# Patient Record
Sex: Female | Born: 1985 | Hispanic: No | Marital: Single | Smoking: Former smoker
Health system: Southern US, Community
[De-identification: ages and names within clinical notes are randomized; demographics above are authoritative.]

## PROBLEM LIST (undated history)

## (undated) ENCOUNTER — Inpatient Hospital Stay (HOSPITAL_COMMUNITY): Payer: Self-pay

## (undated) DIAGNOSIS — I10 Essential (primary) hypertension: Secondary | ICD-10-CM

## (undated) DIAGNOSIS — J45909 Unspecified asthma, uncomplicated: Secondary | ICD-10-CM

## (undated) DIAGNOSIS — Z789 Other specified health status: Secondary | ICD-10-CM

## (undated) DIAGNOSIS — G43909 Migraine, unspecified, not intractable, without status migrainosus: Secondary | ICD-10-CM

## (undated) HISTORY — PX: NO PAST SURGERIES: SHX2092

---
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
## 2007-07-25 ENCOUNTER — Emergency Department: Payer: Self-pay | Admitting: Emergency Medicine

## 2007-12-05 ENCOUNTER — Ambulatory Visit: Payer: Self-pay | Admitting: Obstetrics & Gynecology

## 2007-12-05 ENCOUNTER — Inpatient Hospital Stay (HOSPITAL_COMMUNITY): Admission: AD | Admit: 2007-12-05 | Discharge: 2007-12-07 | Payer: Self-pay | Admitting: Obstetrics & Gynecology

## 2010-11-15 LAB — CBC
MCHC: 34.3
MCV: 92.3
Platelets: 215
WBC: 13.7 — ABNORMAL HIGH

## 2010-11-15 LAB — RPR: RPR Ser Ql: NONREACTIVE

## 2010-11-15 LAB — RAPID HIV SCREEN (WH-MAU): Rapid HIV Screen: NONREACTIVE

## 2012-08-14 ENCOUNTER — Emergency Department (HOSPITAL_COMMUNITY)
Admission: EM | Admit: 2012-08-14 | Discharge: 2012-08-15 | Discharge status: Against Medical Advice | Payer: Self-pay | Attending: Emergency Medicine | Admitting: Emergency Medicine

## 2012-08-14 ENCOUNTER — Encounter (HOSPITAL_COMMUNITY): Payer: Self-pay | Admitting: Emergency Medicine

## 2012-08-14 DIAGNOSIS — Z79899 Other long term (current) drug therapy: Secondary | ICD-10-CM | POA: Insufficient documentation

## 2012-08-14 DIAGNOSIS — O219 Vomiting of pregnancy, unspecified: Secondary | ICD-10-CM | POA: Insufficient documentation

## 2012-08-14 DIAGNOSIS — Z3490 Encounter for supervision of normal pregnancy, unspecified, unspecified trimester: Secondary | ICD-10-CM

## 2012-08-14 DIAGNOSIS — R51 Headache: Secondary | ICD-10-CM | POA: Insufficient documentation

## 2012-08-14 DIAGNOSIS — O99330 Smoking (tobacco) complicating pregnancy, unspecified trimester: Secondary | ICD-10-CM | POA: Insufficient documentation

## 2012-08-14 DIAGNOSIS — F17200 Nicotine dependence, unspecified, uncomplicated: Secondary | ICD-10-CM | POA: Insufficient documentation

## 2012-08-14 DIAGNOSIS — R519 Headache, unspecified: Secondary | ICD-10-CM

## 2012-08-14 DIAGNOSIS — R112 Nausea with vomiting, unspecified: Secondary | ICD-10-CM | POA: Insufficient documentation

## 2012-08-14 DIAGNOSIS — Z3201 Encounter for pregnancy test, result positive: Secondary | ICD-10-CM | POA: Insufficient documentation

## 2012-08-14 LAB — URINALYSIS, ROUTINE W REFLEX MICROSCOPIC
Bilirubin Urine: NEGATIVE
Glucose, UA: NEGATIVE mg/dL
Nitrite: NEGATIVE
Specific Gravity, Urine: 1.02 (ref 1.005–1.030)
pH: 6.5 (ref 5.0–8.0)

## 2012-08-14 LAB — CBC WITH DIFFERENTIAL/PLATELET
Basophils Absolute: 0.1 10*3/uL (ref 0.0–0.1)
Basophils Relative: 1 % (ref 0–1)
Eosinophils Absolute: 0.3 10*3/uL (ref 0.0–0.7)
MCH: 30.1 pg (ref 26.0–34.0)
MCHC: 34.7 g/dL (ref 30.0–36.0)
Neutro Abs: 5.9 10*3/uL (ref 1.7–7.7)
Neutrophils Relative %: 68 % (ref 43–77)
Platelets: 239 10*3/uL (ref 150–400)

## 2012-08-14 LAB — COMPREHENSIVE METABOLIC PANEL
ALT: 7 U/L (ref 0–35)
AST: 12 U/L (ref 0–37)
Albumin: 3.6 g/dL (ref 3.5–5.2)
Alkaline Phosphatase: 66 U/L (ref 39–117)
Potassium: 3.4 mEq/L — ABNORMAL LOW (ref 3.5–5.1)
Sodium: 137 mEq/L (ref 135–145)
Total Protein: 6.8 g/dL (ref 6.0–8.3)

## 2012-08-14 LAB — LIPASE, BLOOD: Lipase: 25 U/L (ref 11–59)

## 2012-08-14 LAB — POCT PREGNANCY, URINE: Preg Test, Ur: POSITIVE — AB

## 2012-08-14 LAB — URINE MICROSCOPIC-ADD ON

## 2012-08-14 NOTE — ED Notes (Signed)
PT. REPORTS INTERMITTENT  LOWER ABDOMINAL PAIN WITH NAUSEA FOR 2 WEEKS - SHE THINKS SHE IS PREGNANT , DENIES DIARRHEA /FEVER OR CHILLS. LMP= 06/30/2012. SLIGHT VAGINAL DISCHARGE.

## 2012-08-15 ENCOUNTER — Emergency Department (HOSPITAL_COMMUNITY): Payer: Self-pay

## 2012-08-15 MED ORDER — ACETAMINOPHEN 325 MG PO TABS
975.0000 mg | ORAL_TABLET | Freq: Once | ORAL | Status: AC
  Administered 2012-08-15: 975 mg via ORAL
  Filled 2012-08-15: qty 3

## 2012-08-15 NOTE — ED Provider Notes (Signed)
History    CSN: [id number] Arrival date & time 08/14/12  2155  First MD Initiated Contact with Patient 08/14/12 2334     Chief Complaint  Patient presents with  . Abdominal Pain   (Consider location/radiation/quality/duration/timing/severity/associated sxs/prior Treatment) HPI Patient presents to the emergency department with lower abdominal pain for the last few weeks.  Patient, states she thinks she may be pregnant.  Patient denies headache, blurred vision, weakness, dizziness, syncope, back pain, diarrhea, chest pain, shortness breath, fever, rash, cough, or dysuria.  Patient, states she's had some mild nausea, one episode of vomiting.  Patient, states she did not take any medications prior to arrival.  Patient also, states, that nothing seems to make her condition, better or worse.   History reviewed. No pertinent past medical history. History reviewed. No pertinent past surgical history. No family history on file. History  Substance Use Topics  . Smoking status: Current Every Day Smoker  . Smokeless tobacco: Not on file  . Alcohol Use: No   OB History   Grav Para Term Preterm Abortions TAB SAB Ect Mult Living                 Review of Systems All other systems negative except as documented in the HPI. All pertinent positives and negatives as reviewed in the HPI. Allergies  Review of patient's allergies indicates no known allergies.  Home Medications   Current Outpatient Rx  Name  Route  Sig  Dispense  Refill  . albuterol (PROVENTIL HFA;VENTOLIN HFA) 108 (90 BASE) MCG/ACT inhaler   Inhalation   Inhale 2 puffs into the lungs every 6 (six) hours as needed for wheezing.          BP 131/78  Pulse 95  Temp(Src) 99.1 F (37.3 C) (Oral)  Resp 14  SpO2 98%  LMP 06/30/2012 Physical Exam  Nursing note and vitals reviewed. Constitutional: She is oriented to person, place, and time. She appears well-developed and well-nourished. No distress.  HENT:  Head:  Normocephalic and atraumatic.  Mouth/Throat: Oropharynx is clear and moist.  Eyes: Pupils are equal, round, and reactive to light.  Neck: Normal range of motion. Neck supple.  Cardiovascular: Normal rate, regular rhythm and normal heart sounds.  Exam reveals no gallop and no friction rub.   No murmur heard. Pulmonary/Chest: Effort normal and breath sounds normal. No respiratory distress.  Abdominal: Soft. Bowel sounds are normal.  Neurological: She is alert and oriented to person, place, and time. She exhibits normal muscle tone. Coordination normal.  Skin: Skin is warm and dry.    ED Course  Procedures (including critical care time) Labs Reviewed  CBC WITH DIFFERENTIAL - Abnormal; Notable for the following:    HCT 35.2 (*)    All other components within normal limits  COMPREHENSIVE METABOLIC PANEL - Abnormal; Notable for the following:    Potassium 3.4 (*)    Calcium 8.2 (*)    Total Bilirubin 0.1 (*)    All other components within normal limits  URINALYSIS, ROUTINE W REFLEX MICROSCOPIC - Abnormal; Notable for the following:    APPearance CLOUDY (*)    Leukocytes, UA SMALL (*)    All other components within normal limits  HCG, QUANTITATIVE, PREGNANCY - Abnormal; Notable for the following:    hCG, Beta Chain, Quant, S 5680 (*)    All other components within normal limits  URINE MICROSCOPIC-ADD ON - Abnormal; Notable for the following:    Squamous Epithelial / LPF MANY (*)  Bacteria, UA FEW (*)    All other components within normal limits  POCT PREGNANCY, URINE - Abnormal; Notable for the following:    Preg Test, Ur POSITIVE (*)    All other components within normal limits  URINE CULTURE  LIPASE, BLOOD   patient is awaiting ultrasound to ensure that there is no abnormality with her pregnancy.  Patient advised followup with GYN.  Patient's been stable here in the emergency department patient,    1:32 AM The patient apparently left prior to her ultrasound being  performed. MDM  MDM Reviewed: nursing note and vitals Interpretation: labs and ultrasound      Carlyle Dolly, PA-C 08/15/12 0115  Carlyle Dolly, PA-C 08/15/12 [phone]

## 2012-08-15 NOTE — ED Provider Notes (Signed)
Medical screening examination/treatment/procedure(s) were performed by non-physician practitioner and as supervising physician I was immediately available for consultation/collaboration.  Dareen Gutzwiller M Jo-Anne Kluth, MD 08/15/12 0413 

## 2012-08-15 NOTE — ED Notes (Signed)
Tech went in room to transport patient to ultrasound, pt not in room and gown on bed. Pt left AMA, P.A made aware.

## 2012-08-16 LAB — URINE CULTURE: Colony Count: 10000

## 2012-08-20 ENCOUNTER — Inpatient Hospital Stay (HOSPITAL_COMMUNITY)
Admission: AD | Admit: 2012-08-20 | Discharge: 2012-08-21 | Disposition: A | Discharge status: Home / Self Care | Payer: Self-pay | Source: Ambulatory Visit | Attending: Obstetrics & Gynecology | Admitting: Obstetrics & Gynecology

## 2012-08-20 ENCOUNTER — Encounter (HOSPITAL_COMMUNITY): Payer: Self-pay | Admitting: *Deleted

## 2012-08-20 DIAGNOSIS — R51 Headache: Secondary | ICD-10-CM | POA: Insufficient documentation

## 2012-08-20 DIAGNOSIS — O99891 Other specified diseases and conditions complicating pregnancy: Secondary | ICD-10-CM | POA: Insufficient documentation

## 2012-08-20 DIAGNOSIS — N949 Unspecified condition associated with female genital organs and menstrual cycle: Secondary | ICD-10-CM | POA: Insufficient documentation

## 2012-08-20 DIAGNOSIS — R109 Unspecified abdominal pain: Secondary | ICD-10-CM | POA: Insufficient documentation

## 2012-08-20 DIAGNOSIS — O26899 Other specified pregnancy related conditions, unspecified trimester: Secondary | ICD-10-CM

## 2012-08-20 DIAGNOSIS — O418X10 Other specified disorders of amniotic fluid and membranes, first trimester, not applicable or unspecified: Secondary | ICD-10-CM

## 2012-08-20 DIAGNOSIS — O468X1 Other antepartum hemorrhage, first trimester: Secondary | ICD-10-CM

## 2012-08-20 HISTORY — DX: Other specified health status: Z78.9

## 2012-08-20 LAB — CBC
HCT: 35.6 % — ABNORMAL LOW (ref 36.0–46.0)
Hemoglobin: 12.5 g/dL (ref 12.0–15.0)
MCH: 30.1 pg (ref 26.0–34.0)
MCHC: 35.1 g/dL (ref 30.0–36.0)
MCV: 85.8 fL (ref 78.0–100.0)

## 2012-08-20 LAB — HCG, QUANTITATIVE, PREGNANCY: hCG, Beta Chain, Quant, S: 19954 m[IU]/mL — ABNORMAL HIGH (ref <5)

## 2012-08-20 MED ORDER — OXYCODONE-ACETAMINOPHEN 5-325 MG PO TABS: 2.0000 | ORAL_TABLET | Freq: Once | ORAL | Status: DC

## 2012-08-20 NOTE — MAU Note (Signed)
Pt states she has been having abdominal pain for almost 2 wks

## 2012-08-20 NOTE — MAU Note (Signed)
PT SAYS SHE HAS HAD ABD PAIN IN LOWER ABD - THAT STARTED  ON 6-25-- NEVER HAD  THIS PAIN BEFORE.    LMP - 5-22.   WENT TO  [facility] ON 7-2-    ONLY TRIAGED- THEN LEFT.    PT HAS  NOT  DONE HPT - THINKS IN DENIAL-   LAST SEX- 6-25.  NO BIRTH CONTROL.  SHE WAS ON NUVARING-   CAUSED IRRITATION- SO SHE STOPPED.  SHE WAS IN MILITARY.    HAS  H/A  - ALL TIME-  STARTED AT LUNCH TIME-  TOOK NO MED.   ALWAYS NAUSEA- NO VOMITING.

## 2012-08-20 NOTE — MAU Provider Note (Signed)
Chief Complaint: No chief complaint on file.  First Provider Initiated Contact with Patient 08/20/12 2334    SUBJECTIVE HPI: Michele Gardner is a 27 y.o. G2P0 at [redacted]w[redacted]d by LMP who presents with low abd pain and nausea x 2 weeks and secondary amenorrhea. Pos UPT at [facility] ED 08/14/12. Quant 5680. No [place]. Left before being seen.   No past medical history on file. OB History   Grav Para Term Preterm Abortions TAB SAB Ect Mult Living   2         1     # Outc Date GA Lbr Len/2nd Wgt Sex Del Anes PTL Lv   1 CUR            2 GRA              No past surgical history on file. History   Social History  . Marital Status: Single    Spouse Name: N/A    Number of Children: N/A  . Years of Education: N/A   Occupational History  . Not on file.   Social History Main Topics  . Smoking status: Current Every Day Smoker  . Smokeless tobacco: Not on file  . Alcohol Use: No  . Drug Use: No  . Sexually Active: Yes   Other Topics Concern  . Not on file   Social History Narrative  . No narrative on file   No current facility-administered medications on file prior to encounter.   Current Outpatient Prescriptions on File Prior to Encounter  Medication Sig Dispense Refill  . albuterol (PROVENTIL HFA;VENTOLIN HFA) 108 (90 BASE) MCG/ACT inhaler Inhale 2 puffs into the lungs every 6 (six) hours as needed for wheezing.       No Known Allergies  ROS: Positive items in HPI. Neg for fever, chills, vomiting, diarrhea, constipation, vaginal bleeding, vaginal discharge, urinary complaints or dyspareunia.   OBJECTIVE Blood pressure 120/77, pulse 80, temperature 98.5 F (36.9 C), temperature source Oral, resp. rate 20, height 5\' 5"  (1.651 m), weight 70.024 kg (154 lb 6 oz), last menstrual period 07/04/2012. GENERAL: Well-developed, well-nourished female in no acute distress.  HEENT: Normocephalic HEART: normal rate RESP: normal effort ABDOMEN: Soft, Mild bilat low abd tenderness. EXTREMITIES: Nontender, no  edema NEURO: Alert and oriented SPECULUM EXAM: NEFG, moderate amount of creamy, white, odorless discharge, no blood noted, cervix clean BIMANUAL: cervix closed; uterus slightly enlarged. no adnexal tenderness or masses  LAB RESULTS Results for orders placed during the hospital encounter of 08/20/12 (from the past 24 hour(s))  ABO/RH     Status: None   Collection Time    08/20/12 10:45 PM      Result Value Range   ABO/RH(D) O POS    HCG, QUANTITATIVE, PREGNANCY     Status: Abnormal   Collection Time    08/20/12 10:45 PM      Result Value Range   hCG, Beta Chain, Quant, S 13086 (*) <5 mIU/mL  WET PREP, GENITAL     Status: Abnormal   Collection Time    08/20/12 10:45 PM      Result Value Range   Yeast Wet Prep HPF POC NONE SEEN  NONE SEEN   Trich, Wet Prep NONE SEEN  NONE SEEN   Clue Cells Wet Prep HPF POC FEW (*) NONE SEEN   WBC, Wet Prep HPF POC FEW (*) NONE SEEN  CBC     Status: Abnormal   Collection Time    08/20/12 10:45 PM  Result Value Range   WBC 10.2  4.0 - 10.5 K/uL   RBC 4.15  3.87 - 5.11 MIL/uL   Hemoglobin 12.5  12.0 - 15.0 g/dL   HCT 16.1 (*) 09.6 - 04.5 %   MCV 85.8  78.0 - 100.0 fL   MCH 30.1  26.0 - 34.0 pg   MCHC 35.1  30.0 - 36.0 g/dL   RDW 40.9  81.1 - 91.4 %   Platelets 250  150 - 400 K/uL    IMAGING US Ob Comp Less 14 Wks  08/21/2012   *RADIOLOGY REPORT*  Clinical Data: Pelvic pain for 2 weeks.  Estimated gestational age by LMP is 6 weeks 6 days.  Quantitative beta HCG is 19,954.  OBSTETRIC <14 WK [place] AND TRANSVAGINAL OB US  Technique:  Both transabdominal and transvaginal ultrasound examinations were performed for complete evaluation of the gestation as well as the maternal uterus, adnexal regions, and pelvic cul-de-sac.  Transvaginal technique was performed to assess early pregnancy.  Comparison:  None.  Intrauterine gestational sac:  A single intrauterine gestational sac is visualized. The gestational sac appears to be located laterally in the uterine  fundus towards the right. Yolk sac: Yolk sac is visualized. Embryo: Fetal pole is visualized. Cardiac Activity: Fetal cardiac activity is observed. Heart Rate: 116 bpm  CRL: 6.7  mm  6 w  4 d        [place] EDC: 04/12/2012  Maternal uterus/adnexae: Uterine myometrium appears homogeneous.  There is a moderate sized subchorionic hemorrhage noted.  Small amount of fluid visualized in the lower uterine segment.  Both ovaries are visualized.  No abnormal adnexal masses are seen.  No free pelvic fluid collections.  IMPRESSION: Single intrauterine pregnancy demonstrated.  Moderate sized subchorionic hemorrhage.  Estimated gestational age by crown-rump length is 6 weeks 4 days.   Original Report Authenticated By: Burman Nieves, M.D.   US Ob Transvaginal  08/21/2012   *RADIOLOGY REPORT*  Clinical Data: Pelvic pain for 2 weeks.  Estimated gestational age by LMP is 6 weeks 6 days.  Quantitative beta HCG is 19,954.  OBSTETRIC <14 WK [place] AND TRANSVAGINAL OB US  Technique:  Both transabdominal and transvaginal ultrasound examinations were performed for complete evaluation of the gestation as well as the maternal uterus, adnexal regions, and pelvic cul-de-sac.  Transvaginal technique was performed to assess early pregnancy.  Comparison:  None.  Intrauterine gestational sac:  A single intrauterine gestational sac is visualized. The gestational sac appears to be located laterally in the uterine fundus towards the right. Yolk sac: Yolk sac is visualized. Embryo: Fetal pole is visualized. Cardiac Activity: Fetal cardiac activity is observed. Heart Rate: 116 bpm  CRL: 6.7  mm  6 w  4 d        [place] EDC: 04/12/2012  Maternal uterus/adnexae: Uterine myometrium appears homogeneous.  There is a moderate sized subchorionic hemorrhage noted.  Small amount of fluid visualized in the lower uterine segment.  Both ovaries are visualized.  No abnormal adnexal masses are seen.  No free pelvic fluid collections.  IMPRESSION: Single intrauterine  pregnancy demonstrated.  Moderate sized subchorionic hemorrhage.  Estimated gestational age by crown-rump length is 6 weeks 4 days.   Original Report Authenticated By: Burman Nieves, M.D.    MAU COURSE  ASSESSMENT 1. Abdominal pain in pregnancy, antepartum   2. Subchorionic hemorrhage in first trimester    PLAN Discharge home in stable condition. Tylenol PRN. Pregnancy verification given. Comfort measures.  Follow-up Information   Follow  up with [facility].      Follow up with [facility] [place] [facility]. (As needed if symptoms worsen)    Contact information:   [address] [id number] [place] [address] [phone]        Medication List         albuterol 108 (90 BASE) MCG/ACT inhaler  Commonly known as:  PROVENTIL HFA;VENTOLIN HFA  Inhale 2 puffs into the lungs every 6 (six) hours as needed for wheezing.       [place], [place] 08/20/2012  11:17 PM

## 2012-08-21 ENCOUNTER — Inpatient Hospital Stay (HOSPITAL_COMMUNITY): Payer: Self-pay

## 2012-08-21 DIAGNOSIS — O9989 Other specified diseases and conditions complicating pregnancy, childbirth and the puerperium: Secondary | ICD-10-CM

## 2012-08-21 DIAGNOSIS — R109 Unspecified abdominal pain: Secondary | ICD-10-CM

## 2012-08-21 LAB — WET PREP, GENITAL

## 2012-08-21 LAB — ABO/RH: ABO/RH(D): O POS

## 2012-12-10 ENCOUNTER — Emergency Department (HOSPITAL_COMMUNITY)
Admission: EM | Admit: 2012-12-10 | Discharge: 2012-12-10 | Discharge status: Against Medical Advice | Payer: Self-pay | Attending: Emergency Medicine | Admitting: Emergency Medicine

## 2012-12-10 ENCOUNTER — Encounter (HOSPITAL_COMMUNITY): Payer: Self-pay | Admitting: Emergency Medicine

## 2012-12-10 DIAGNOSIS — F17200 Nicotine dependence, unspecified, uncomplicated: Secondary | ICD-10-CM | POA: Insufficient documentation

## 2012-12-10 DIAGNOSIS — G43909 Migraine, unspecified, not intractable, without status migrainosus: Secondary | ICD-10-CM | POA: Insufficient documentation

## 2012-12-10 NOTE — ED Notes (Signed)
Pt c/o migraine headache. States she has a hx of migraines. Pt states she has had symptoms since yesterday. Pt c/o head spinning, nausea, and dizziness since yesterday. Pt states she has taken Exedrin for pain. States she last took it 2 days ago. Pt denies vomiting. Denies blurred vision. Pt alert, no acute distress. Pt ambulatory to exam room with steady gait.

## 2012-12-10 NOTE — ED Notes (Signed)
Called for patient 3 times.  Not in lobby.  Will discharge.

## 2013-01-18 ENCOUNTER — Emergency Department (INDEPENDENT_AMBULATORY_CARE_PROVIDER_SITE_OTHER)
Admission: EM | Admit: 2013-01-18 | Discharge: 2013-01-18 | Disposition: A | Discharge status: Home / Self Care | Payer: Self-pay | Source: Home / Self Care | Attending: Emergency Medicine | Admitting: Emergency Medicine

## 2013-01-18 ENCOUNTER — Encounter (HOSPITAL_COMMUNITY): Payer: Self-pay | Admitting: Emergency Medicine

## 2013-01-18 ENCOUNTER — Emergency Department (INDEPENDENT_AMBULATORY_CARE_PROVIDER_SITE_OTHER): Payer: Self-pay

## 2013-01-18 DIAGNOSIS — M549 Dorsalgia, unspecified: Secondary | ICD-10-CM

## 2013-01-18 DIAGNOSIS — J069 Acute upper respiratory infection, unspecified: Secondary | ICD-10-CM

## 2013-01-18 DIAGNOSIS — R0789 Other chest pain: Secondary | ICD-10-CM

## 2013-01-18 MED ORDER — BENZONATATE 200 MG PO CAPS: 200.0000 mg | ORAL_CAPSULE | Freq: Three times a day (TID) | ORAL | Status: DC | PRN

## 2013-01-18 MED ORDER — AZITHROMYCIN 250 MG PO TABS: ORAL_TABLET | ORAL | Status: DC

## 2013-01-18 MED ORDER — METHYLPREDNISOLONE 4 MG PO KIT: PACK | ORAL | Status: DC

## 2013-01-18 NOTE — ED Provider Notes (Signed)
Medical screening examination/treatment/procedure(s) were performed by non-physician practitioner and as supervising physician I was immediately available for consultation/collaboration.  Leslee Home, M.D.  Reuben Likes, MD 01/18/13 2055

## 2013-01-18 NOTE — ED Provider Notes (Signed)
CSN: [id number]     Arrival date & time 01/18/13  1154 History   First MD Initiated Contact with Patient 01/18/13 1326     Chief Complaint  Patient presents with  . URI   (Consider location/radiation/quality/duration/timing/severity/associated sxs/prior Treatment) HPI Comments: 27 year old female with a history of asthma, currently smoking one pack per day, presents complaining of chest tightness that radiates to her back, sore throat, swollen tonsils, fever, chills. This began yesterday. She has been taking over-the-counter medications without relief of her symptoms. Her symptoms have gradually been worsening since she first noticed them yesterday. She denies abdominal pain, NVD, dizziness, lightheadedness, severe chest pain, measured fever, recent travel, sick contacts. She did not receive her flu shot this year   Past Medical History  Diagnosis Date  . Medical history non-contributory    Past Surgical History  Procedure Laterality Date  . No past surgeries     Family History  Problem Relation Age of Onset  . Hypertension Mother   . Cancer Maternal Grandfather   . Cancer Paternal Grandmother   . Diabetes Paternal Grandfather    History  Substance Use Topics  . Smoking status: Current Every Day Smoker  . Smokeless tobacco: Not on file  . Alcohol Use: No   OB History   Grav Para Term Preterm Abortions TAB SAB Ect Mult Living   2         1     Review of Systems  Constitutional: Positive for fever, chills and fatigue.  HENT: Positive for congestion and sore throat.   Eyes: Negative for visual disturbance.  Respiratory: Positive for cough and chest tightness. Negative for shortness of breath and wheezing.   Cardiovascular: Negative for chest pain, palpitations and leg swelling.  Gastrointestinal: Negative for nausea, vomiting and abdominal pain.  Endocrine: Negative for polydipsia and polyuria.  Genitourinary: Negative for dysuria, urgency and frequency.  Musculoskeletal:  Positive for back pain. Negative for arthralgias and myalgias.  Skin: Negative for rash.  Neurological: Negative for dizziness, weakness and light-headedness.    Allergies  Review of patient's allergies indicates no known allergies.  Home Medications   Current Outpatient Rx  Name  Route  Sig  Dispense  Refill  . albuterol (PROVENTIL HFA;VENTOLIN HFA) 108 (90 BASE) MCG/ACT inhaler   Inhalation   Inhale 2 puffs into the lungs every 6 (six) hours as needed for wheezing.         Marland Kitchen aspirin-acetaminophen-caffeine (EXCEDRIN MIGRAINE) 250-250-65 MG per tablet   Oral   Take 1 tablet by mouth every 6 (six) hours as needed for pain (pain).         Marland Kitchen azithromycin (ZITHROMAX Z-PAK) 250 MG tablet      Use as directed   6 each   0   . benzonatate (TESSALON) 200 MG capsule   Oral   Take 1 capsule (200 mg total) by mouth 3 (three) times daily as needed for cough.   20 capsule   0   . methylPREDNISolone (MEDROL DOSEPAK) 4 MG tablet      follow package directions   21 tablet   0     Dispense as written.    BP 140/96  Pulse 99  Temp(Src) 100.3 F (37.9 C) (Oral)  Resp 16  SpO2 99%  LMP 01/13/2013  Breastfeeding? No Physical Exam  Nursing note and vitals reviewed. Constitutional: She is oriented to person, place, and time. Vital signs are normal. She appears well-developed and well-nourished. No distress.  HENT:  Head:  Normocephalic and atraumatic.  Nose: Nose normal.  Mouth/Throat: Oropharynx is clear and moist. No oropharyngeal exudate.  Eyes: Conjunctivae and EOM are normal. Pupils are equal, round, and reactive to light. Right eye exhibits no discharge. Left eye exhibits no discharge.  Neck: Normal range of motion. Neck supple.  Cardiovascular: Normal rate and normal heart sounds.   Pulmonary/Chest: Effort normal and breath sounds normal. No respiratory distress.  Abdominal: Soft. There is no tenderness.  Lymphadenopathy:    She has no cervical adenopathy.   Neurological: She is alert and oriented to person, place, and time. She has normal strength. Coordination normal.  Skin: Skin is warm and dry. No rash noted. She is not diaphoretic.  Psychiatric: She has a normal mood and affect. Judgment normal.    ED Course  Procedures (including critical care time) Labs Review Labs Reviewed  CULTURE, GROUP A STREP  POCT RAPID STREP A ([facility] ONLY)   Imaging Review Dg Chest 2 View  01/18/2013   CLINICAL DATA:  Cough, congestion and fever  EXAM: CHEST  2 VIEW  COMPARISON:  None.  FINDINGS: The heart size and mediastinal contours are within normal limits. Both lungs are clear. The visualized skeletal structures are unremarkable.  IMPRESSION: No active cardiopulmonary disease.   Electronically Signed   By: Signa Kell M.D.   On: 01/18/2013 14:02      MDM   1. URI (upper respiratory infection)   2. Chest tightness   3. Back pain    Other than low-grade fever, vital signs normal. Patient is sitting comfortably, in no distress. Treating symptomatically, followup immediately if any worsening of the chest tightness or shortness of breath  Meds ordered this encounter  Medications  . azithromycin (ZITHROMAX Z-PAK) 250 MG tablet    Sig: Use as directed    Dispense:  6 each    Refill:  0    Order Specific Question:  Supervising Provider    Answer:  Lorenz Coaster, DAVID C [id number]  . methylPREDNISolone (MEDROL DOSEPAK) 4 MG tablet    Sig: follow package directions    Dispense:  21 tablet    Refill:  0    Order Specific Question:  Supervising Provider    Answer:  Lorenz Coaster, DAVID C [id number]  . benzonatate (TESSALON) 200 MG capsule    Sig: Take 1 capsule (200 mg total) by mouth 3 (three) times daily as needed for cough.    Dispense:  20 capsule    Refill:  0    Order Specific Question:  Supervising Provider    Answer:  Lorenz Coaster, DAVID C [[id number]]     Graylon Good, PA-C 01/18/13 1750

## 2013-01-18 NOTE — ED Notes (Signed)
Pt c/o cold sxs onset yest Sxs include: Chest d/c that radiates to the back, ST, dry cough, swollen tonsils, chills, fevers Denies: v/n/d, wheezing, SOB... Hx of asthma Smokes 1 PPD She is alert w/no signs of acute distress.

## 2013-01-20 LAB — CULTURE, GROUP A STREP

## 2013-01-21 ENCOUNTER — Emergency Department: Payer: Self-pay | Admitting: Emergency Medicine

## 2013-01-21 LAB — RAPID INFLUENZA A&B ANTIGENS

## 2013-10-07 ENCOUNTER — Encounter (HOSPITAL_COMMUNITY): Payer: Self-pay | Admitting: *Deleted

## 2013-10-07 ENCOUNTER — Inpatient Hospital Stay (HOSPITAL_COMMUNITY)
Admission: AD | Admit: 2013-10-07 | Discharge: 2013-10-07 | Disposition: A | Discharge status: Home / Self Care | Payer: Self-pay | Source: Ambulatory Visit | Attending: Obstetrics & Gynecology | Admitting: Obstetrics & Gynecology

## 2013-10-07 DIAGNOSIS — J069 Acute upper respiratory infection, unspecified: Secondary | ICD-10-CM | POA: Insufficient documentation

## 2013-10-07 DIAGNOSIS — F17200 Nicotine dependence, unspecified, uncomplicated: Secondary | ICD-10-CM | POA: Insufficient documentation

## 2013-10-07 HISTORY — DX: Unspecified asthma, uncomplicated: J45.909

## 2013-10-07 LAB — RAPID STREP SCREEN (MED CTR MEBANE ONLY): Streptococcus, Group A Screen (Direct): NEGATIVE

## 2013-10-07 NOTE — MAU Provider Note (Signed)
History     CSN: [id number]  Arrival date and time: 10/07/13 1708   None     Chief Complaint  Patient presents with  . Sore Throat   HPI  Michele Gardner 28 y.o. [id number] presents to MAU with 2-3 days of sore throat, chest tightness, body aches and headache.  Each day has been more severe.  The sore throat is the most bothersome.  She has been unable to eat anything today.  She feels hot and cold but has not taken temperature.  No congestion but she has had dry cough and sneezing.  She has not used any medications.   Hot tea helps but only while she is drinking it.    OB History   Grav Para Term Preterm Abortions TAB SAB Ect Mult Living   Past Medical History  Diagnosis Date  . Medical history non-contributory   . Asthma     Past Surgical History  Procedure Laterality Date  . No past surgeries      Family History  Problem Relation Age of Onset  . Hypertension Mother   . Cancer Maternal Grandfather   . Cancer Paternal Grandmother   . Diabetes Paternal Grandfather     History  Substance Use Topics  . Smoking status: Current Every Day Smoker -- 0.50 packs/day  . Smokeless tobacco: Not on file  . Alcohol Use: No    Allergies: No Known Allergies  Prescriptions prior to admission  Medication Sig Dispense Refill  . albuterol (PROVENTIL HFA;VENTOLIN HFA) 108 (90 BASE) MCG/ACT inhaler Inhale 2 puffs into the lungs every 6 (six) hours as needed for wheezing.      Marland Kitchen aspirin-acetaminophen-caffeine (EXCEDRIN MIGRAINE) 250-250-65 MG per tablet Take 1 tablet by mouth every 6 (six) hours as needed for pain (pain).      Marland Kitchen azithromycin (ZITHROMAX Z-PAK) 250 MG tablet Use as directed  6 each  0  . benzonatate (TESSALON) 200 MG capsule Take 1 capsule (200 mg total) by mouth 3 (three) times daily as needed for cough.  20 capsule  0  . methylPREDNISolone (MEDROL DOSEPAK) 4 MG tablet follow package directions  21 tablet  0    Review of Systems  Constitutional:  Positive for chills and diaphoresis. Negative for fever.  HENT: Positive for congestion and sore throat.   Eyes: Negative for blurred vision and double vision.  Respiratory: Positive for cough. Negative for shortness of breath and wheezing.   Cardiovascular: Negative for chest pain and palpitations.  Gastrointestinal: Positive for heartburn and abdominal pain. Negative for nausea, vomiting, diarrhea and constipation.  Genitourinary: Negative for dysuria, urgency and frequency.  Skin: Negative for itching and rash.  Neurological: Positive for weakness and headaches. Negative for dizziness.  Psychiatric/Behavioral: Negative for depression, suicidal ideas and substance abuse.   Physical Exam   Blood pressure 127/94, pulse 95, temperature 98.6 F (37 C), temperature source Oral, resp. rate 16, height  (1.651 m), weight 68.402 kg (150 lb 12.8 oz), last menstrual period 10/01/2013, SpO2 98.00%.  Physical Exam  Constitutional: She is oriented to person, place, and time. She appears well-developed and well-nourished. No distress.  HENT:  Head: Normocephalic and atraumatic.  Erythema present in oropharynx with enlarged 2+ tonsils bilat.  No exudate  Eyes: EOM are normal.  Neck: Normal range of motion.  Cardiovascular: Regular rhythm and normal heart sounds.  Exam reveals no gallop and no friction rub.  No murmur heard. Respiratory: Effort normal and breath sounds normal. No respiratory distress.  GI: Soft. Bowel sounds are normal. She exhibits no distension.  Lymphadenopathy:    She has cervical adenopathy.  Neurological: She is alert and oriented to person, place, and time.  Skin: Skin is warm and dry.  Psychiatric: She has a normal mood and affect.   Results for orders placed during the hospital encounter of 10/07/13 (from the past 24 hour(s))  RAPID STREP SCREEN     Status: None   Collection Time    10/07/13  6:25 PM      Result Value Ref Range   Streptococcus, Group A Screen  (Direct) NEGATIVE  NEGATIVE    MAU Course  Procedures None  MDM No evidence of strep  Assessment and Plan  A: URI P: Discharge to home with supportive treatment. [facility] for worsening of symptoms  Bertram Denver 10/07/2013, 6:12 PM

## 2013-10-07 NOTE — Discharge Instructions (Signed)
Cough, Adult ° A cough is a reflex that helps clear your throat and airways. It can help heal the body or may be a reaction to an irritated airway. A cough may only last 2 or 3 weeks (acute) or may last more than 8 weeks (chronic).  °CAUSES °Acute cough: °· Viral or bacterial infections. °Chronic cough: °· Infections. °· Allergies. °· Asthma. °· Post-nasal drip. °· Smoking. °· Heartburn or acid reflux. °· Some medicines. °· Chronic lung problems (COPD). °· Cancer. °SYMPTOMS  °· Cough. °· Fever. °· Chest pain. °· Increased breathing rate. °· High-pitched whistling sound when breathing (wheezing). °· Colored mucus that you cough up (sputum). °TREATMENT  °· A bacterial cough may be treated with antibiotic medicine. °· A viral cough must run its course and will not respond to antibiotics. °· Your caregiver may recommend other treatments if you have a chronic cough. °HOME CARE INSTRUCTIONS  °· Only take over-the-counter or prescription medicines for pain, discomfort, or fever as directed by your caregiver. Use cough suppressants only as directed by your caregiver. °· Use a cold steam vaporizer or humidifier in your bedroom or home to help loosen secretions. °· Sleep in a semi-upright position if your cough is worse at night. °· Rest as needed. °· Stop smoking if you smoke. °SEEK IMMEDIATE MEDICAL CARE IF:  °· You have pus in your sputum. °· Your cough starts to worsen. °· You cannot control your cough with suppressants and are losing sleep. °· You begin coughing up blood. °· You have difficulty breathing. °· You develop pain which is getting worse or is uncontrolled with medicine. °· You have a fever. °MAKE SURE YOU:  °· Understand these instructions. °· Will watch your condition. °· Will get help right away if you are not doing well or get worse. °Document Released: 07/29/2010 Document Revised: 04/24/2011 Document Reviewed: 07/29/2010 °ExitCare® Patient Information ©2015 [employer]. This information is not intended  to replace advice given to you by your health care provider. Make sure you discuss any questions you have with your health care provider. °Upper Respiratory Infection, Adult °An upper respiratory infection (URI) is also sometimes known as the common cold. The upper respiratory tract includes the nose, sinuses, throat, trachea, and bronchi. Bronchi are the airways leading to the lungs. Most people improve within 1 week, but symptoms can last up to 2 weeks. A residual cough may last even longer.  °CAUSES °Many different viruses can infect the tissues lining the upper respiratory tract. The tissues become irritated and inflamed and often become very moist. Mucus production is also common. A cold is contagious. You can easily spread the virus to others by oral contact. This includes kissing, sharing a glass, coughing, or sneezing. Touching your mouth or nose and then touching a surface, which is then touched by another person, can also spread the virus. °SYMPTOMS  °Symptoms typically develop 1 to 3 days after you come in contact with a cold virus. Symptoms vary from person to person. They may include: °· Runny nose. °· Sneezing. °· Nasal congestion. °· Sinus irritation. °· Sore throat. °· Loss of voice (laryngitis). °· Cough. °· Fatigue. °· Muscle aches. °· Loss of appetite. °· Headache. °· Low-grade fever. °DIAGNOSIS  °You might diagnose your own cold based on familiar symptoms, since most people get a cold 2 to 3 times a year. Your caregiver can confirm this based on your exam. Most importantly, your caregiver can check that your symptoms are not due to another disease such   as strep throat, sinusitis, pneumonia, asthma, or epiglottitis. Blood tests, throat tests, and X-rays are not necessary to diagnose a common cold, but they may sometimes be helpful in excluding other more serious diseases. Your caregiver will decide if any further tests are required. °RISKS AND COMPLICATIONS  °You may be at risk for a more severe  case of the common cold if you smoke cigarettes, have chronic heart disease (such as heart failure) or lung disease (such as asthma), or if you have a weakened immune system. The very young and very old are also at risk for more serious infections. Bacterial sinusitis, middle ear infections, and bacterial pneumonia can complicate the common cold. The common cold can worsen asthma and chronic obstructive pulmonary disease (COPD). Sometimes, these complications can require emergency medical care and may be life-threatening. °PREVENTION  °The best way to protect against getting a cold is to practice good hygiene. Avoid oral or hand contact with people with cold symptoms. Wash your hands often if contact occurs. There is no clear evidence that vitamin C, vitamin E, echinacea, or exercise reduces the chance of developing a cold. However, it is always recommended to get plenty of rest and practice good nutrition. °TREATMENT  °Treatment is directed at relieving symptoms. There is no cure. Antibiotics are not effective, because the infection is caused by a virus, not by bacteria. Treatment may include: °· Increased fluid intake. Sports drinks offer valuable electrolytes, sugars, and fluids. °· Breathing heated mist or steam (vaporizer or shower). °· Eating chicken soup or other clear broths, and maintaining good nutrition. °· Getting plenty of rest. °· Using gargles or lozenges for comfort. °· Controlling fevers with ibuprofen or acetaminophen as directed by your caregiver. °· Increasing usage of your inhaler if you have asthma. °Zinc gel and zinc lozenges, taken in the first 24 hours of the common cold, can shorten the duration and lessen the severity of symptoms. Pain medicines may help with fever, muscle aches, and throat pain. A variety of non-prescription medicines are available to treat congestion and runny nose. Your caregiver can make recommendations and may suggest nasal or lung inhalers for other symptoms.  °HOME  CARE INSTRUCTIONS  °· Only take over-the-counter or prescription medicines for pain, discomfort, or fever as directed by your caregiver. °· Use a warm mist humidifier or inhale steam from a shower to increase air moisture. This may keep secretions moist and make it easier to breathe. °· Drink enough water and fluids to keep your urine clear or pale yellow. °· Rest as needed. °· Return to work when your temperature has returned to normal or as your caregiver advises. You may need to stay home longer to avoid infecting others. You can also use a face mask and careful hand washing to prevent spread of the virus. °SEEK MEDICAL CARE IF:  °· After the first few days, you feel you are getting worse rather than better. °· You need your caregiver's advice about medicines to control symptoms. °· You develop chills, worsening shortness of breath, or brown or red sputum. These may be signs of pneumonia. °· You develop yellow or brown nasal discharge or pain in the face, especially when you bend forward. These may be signs of sinusitis. °· You develop a fever, swollen neck glands, pain with swallowing, or white areas in the back of your throat. These may be signs of strep throat. °SEEK IMMEDIATE MEDICAL CARE IF:  °· You have a fever. °· You develop severe or persistent headache, ear   pain, sinus pain, or chest pain. °· You develop wheezing, a prolonged cough, cough up blood, or have a change in your usual mucus (if you have chronic lung disease). °· You develop sore muscles or a stiff neck. °Document Released: 07/26/2000 Document Revised: 04/24/2011 Document Reviewed: 05/07/2013 °ExitCare® Patient Information ©2015 [employer]. This information is not intended to replace advice given to you by your health care provider. Make sure you discuss any questions you have with your health care provider. ° °

## 2013-10-07 NOTE — MAU Note (Signed)
Patient states she has had a sore throat for the past 2 days. Has general body aches going from hot to cold, has not taken temperature.

## 2013-10-07 NOTE — MAU Provider Note (Signed)
Attestation of Attending Supervision of Advanced Practitioner (PA/CNM/NP): Evaluation and management procedures were performed by the Advanced Practitioner under my supervision and collaboration.  I have reviewed the Advanced Practitioner's note and chart, and I agree with the management and plan.  Miner Koral, MD, FACOG Attending Obstetrician & Gynecologist Faculty Practice, [facility]   

## 2013-10-09 LAB — CULTURE, GROUP A STREP

## 2013-12-15 ENCOUNTER — Encounter (HOSPITAL_COMMUNITY): Payer: Self-pay | Admitting: *Deleted

## 2013-12-30 ENCOUNTER — Emergency Department (HOSPITAL_COMMUNITY)
Admission: EM | Admit: 2013-12-30 | Discharge: 2013-12-30 | Disposition: A | Discharge status: Home / Self Care | Payer: Self-pay | Attending: Emergency Medicine | Admitting: Emergency Medicine

## 2013-12-30 ENCOUNTER — Encounter (HOSPITAL_COMMUNITY): Payer: Self-pay

## 2013-12-30 DIAGNOSIS — J00 Acute nasopharyngitis [common cold]: Secondary | ICD-10-CM | POA: Insufficient documentation

## 2013-12-30 DIAGNOSIS — Z79899 Other long term (current) drug therapy: Secondary | ICD-10-CM | POA: Insufficient documentation

## 2013-12-30 DIAGNOSIS — H938X3 Other specified disorders of ear, bilateral: Secondary | ICD-10-CM | POA: Insufficient documentation

## 2013-12-30 DIAGNOSIS — J45909 Unspecified asthma, uncomplicated: Secondary | ICD-10-CM | POA: Insufficient documentation

## 2013-12-30 DIAGNOSIS — Z720 Tobacco use: Secondary | ICD-10-CM | POA: Insufficient documentation

## 2013-12-30 MED ORDER — GUAIFENESIN ER 600 MG PO TB12: 600.0000 mg | ORAL_TABLET | Freq: Two times a day (BID) | ORAL | Status: DC

## 2013-12-30 MED ORDER — FLUTICASONE PROPIONATE 50 MCG/ACT NA SUSP: 2.0000 | Freq: Every day | NASAL | Status: DC

## 2013-12-30 NOTE — ED Provider Notes (Signed)
CSN: [id number]     Arrival date & time 12/30/13  1218 History   First MD Initiated Contact with Patient 12/30/13 1236     Chief Complaint  Patient presents with  . Otalgia     (Consider location/radiation/quality/duration/timing/severity/associated sxs/prior Treatment) HPI Comments: This is a 28 year old female who presents to the emergency department complaining of bilateral ear pain 1 week, worsening over the past 2 days. She reports both of her ears feel full as if they had fluid in them. Her left ear is hurting more than the right. She does admit to using Q-tips to clean her ears. Denies any drainage. States she's been very congested. Denies fever, chills, nausea, vomiting, dizziness, cough.  Patient is a 28 y.o. female presenting with ear pain. The history is provided by the patient.  Otalgia   Past Medical History  Diagnosis Date  . Medical history non-contributory   . Asthma    Past Surgical History  Procedure Laterality Date  . No past surgeries     Family History  Problem Relation Age of Onset  . Hypertension Mother   . Cancer Maternal Grandfather   . Cancer Paternal Grandmother   . Diabetes Paternal Grandfather    History  Substance Use Topics  . Smoking status: Current Every Day Smoker -- 0.50 packs/day  . Smokeless tobacco: Not on file  . Alcohol Use: No   OB History    Gravida Para Term Preterm AB TAB SAB Ectopic Multiple Living   2    1     1      Review of Systems  HENT: Positive for ear pain.    10 Systems reviewed and are negative for acute change except as noted in the HPI.   Allergies  Review of patient's allergies indicates no known allergies.  Home Medications   Prior to Admission medications   Medication Sig Start Date End Date Taking? Authorizing Provider  albuterol (PROVENTIL HFA;VENTOLIN HFA) 108 (90 BASE) MCG/ACT inhaler Inhale 2 puffs into the lungs every 6 (six) hours as needed for wheezing.   Yes Historical Provider, MD   aspirin-acetaminophen-caffeine (EXCEDRIN MIGRAINE) [id number] MG per tablet Take 1 tablet by mouth every 6 (six) hours as needed for pain (pain).   Yes Historical Provider, MD  fluticasone (FLONASE) 50 MCG/ACT nasal spray Place 2 sprays into both nostrils daily. 12/30/13   Darletta Noblett M Dolorez Jeffrey, PA-C  guaiFENesin (MUCINEX) 600 MG 12 hr tablet Take 1 tablet (600 mg total) by mouth 2 (two) times daily. 12/30/13   Diante Barley M Amarion Portell, PA-C   BP 119/85 mmHg  Pulse 81  Temp(Src) 98.4 F (36.9 C) (Oral)  Resp 16  SpO2 100%  LMP 12/05/2013 Physical Exam  Constitutional: She is oriented to person, place, and time. She appears well-developed and well-nourished. No distress.  HENT:  Head: Normocephalic and atraumatic.  Bilateral ear canal and TMs normal. Mucosal edema, nasal congestion. Post nasal drip. Bilateral frontal sinus tenderness.  Eyes: Conjunctivae and EOM are normal.  Neck: Normal range of motion. Neck supple.  Cardiovascular: Normal rate, regular rhythm and normal heart sounds.   Pulmonary/Chest: Effort normal and breath sounds normal. No respiratory distress.  Musculoskeletal: Normal range of motion. She exhibits no edema.  Neurological: She is alert and oriented to person, place, and time. No sensory deficit.  Skin: Skin is warm and dry.  Psychiatric: She has a normal mood and affect. Her behavior is normal.  Nursing note and vitals reviewed.   ED Course  Procedures (including critical  care time) Labs Review Labs Reviewed - No data to display  Imaging Review No results found.   EKG Interpretation None      MDM   Final diagnoses:  Ear fullness, bilateral  Acute rhinitis   Patient with fullness sensation behind her ears. AFVSS. NAD. Bilateral ear canals and tympanic membranes normal. She does have nasal congestion, mucosal edema, postnasal drip and frontal sinus tenderness. Ear pain most likely radiating from sinus congestion. Advised Mucinex and Flonase. Stable for discharge.  Return precautions given. Patient states understanding of treatment care plan and is agreeable.   Kathrynn SpeedRobyn M Omolara Carol, PA-C 12/30/13 1306  Geoffery Lyonsouglas Delo, MD 12/30/13 [phone]

## 2013-12-30 NOTE — Discharge Instructions (Signed)
Take mucinex as directed along with using nasal spray. Otalgia The most common reason for this in children is an infection of the middle ear. Pain from the middle ear is usually caused by a build-up of fluid and pressure behind the eardrum. Pain from an earache can be sharp, dull, or burning. The pain may be temporary or constant. The middle ear is connected to the nasal passages by a short narrow tube called the Eustachian tube. The Eustachian tube allows fluid to drain out of the middle ear, and helps keep the pressure in your ear equalized. CAUSES  A cold or allergy can block the Eustachian tube with inflammation and the build-up of secretions. This is especially likely in small children, because their Eustachian tube is shorter and more horizontal. When the Eustachian tube closes, the normal flow of fluid from the middle ear is stopped. Fluid can accumulate and cause stuffiness, pain, hearing loss, and an ear infection if germs start growing in this area. SYMPTOMS  The symptoms of an ear infection may include fever, ear pain, fussiness, increased crying, and irritability. Many children will have temporary and minor hearing loss during and right after an ear infection. Permanent hearing loss is rare, but the risk increases the more infections a child has. Other causes of ear pain include retained water in the outer ear canal from swimming and bathing. Ear pain in adults is less likely to be from an ear infection. Ear pain may be referred from other locations. Referred pain may be from the joint between your jaw and the skull. It may also come from a tooth problem or problems in the neck. Other causes of ear pain include:  A foreign body in the ear.  Outer ear infection.  Sinus infections.  Impacted ear wax.  Ear injury.  Arthritis of the jaw or TMJ problems.  Middle ear infection.  Tooth infections.  Sore throat with pain to the ears. DIAGNOSIS  Your caregiver can usually make the  diagnosis by examining you. Sometimes other special studies, including x-rays and lab work may be necessary. TREATMENT   If antibiotics were prescribed, use them as directed and finish them even if you or your child's symptoms seem to be improved.  Sometimes PE tubes are needed in children. These are little plastic tubes which are put into the eardrum during a simple surgical procedure. They allow fluid to drain easier and allow the pressure in the middle ear to equalize. This helps relieve the ear pain caused by pressure changes. HOME CARE INSTRUCTIONS   Only take over-the-counter or prescription medicines for pain, discomfort, or fever as directed by your caregiver. DO NOT GIVE CHILDREN ASPIRIN because of the association of Reye's Syndrome in children taking aspirin.  Use a cold pack applied to the outer ear for 15-20 minutes, 03-04 times per day or as needed may reduce pain. Do not apply ice directly to the skin. You may cause frost bite.  Over-the-counter ear drops used as directed may be effective. Your caregiver may sometimes prescribe ear drops.  Resting in an upright position may help reduce pressure in the middle ear and relieve pain.  Ear pain caused by rapidly descending from high altitudes can be relieved by swallowing or chewing gum. Allowing infants to suck on a bottle during airplane travel can help.  Do not smoke in the house or near children. If you are unable to quit smoking, smoke outside.  Control allergies. SEEK IMMEDIATE MEDICAL CARE IF:   You or  your child are becoming sicker.  Pain or fever relief is not obtained with medicine.  You or your child's symptoms (pain, fever, or irritability) do not improve within 24 to 48 hours or as instructed.  Severe pain suddenly stops hurting. This may indicate a ruptured eardrum.  You or your children develop new problems such as severe headaches, stiff neck, difficulty swallowing, or swelling of the face or around the  ear. Document Released: 09/17/2003 Document Revised: 04/24/2011 Document Reviewed: 01/22/2008 [facility] IncExitCare Patient Information 2015 [place], [place]. This information is not intended to replace advice given to you by your health care provider. Make sure you discuss any questions you have with your health care provider. Allergic Rhinitis Allergic rhinitis is when the mucous membranes in the nose respond to allergens. Allergens are particles in the air that cause your body to have an allergic reaction. This causes you to release allergic antibodies. Through a chain of events, these eventually cause you to release histamine into the blood stream. Although meant to protect the body, it is this release of histamine that causes your discomfort, such as frequent sneezing, congestion, and an itchy, runny nose.  CAUSES  Seasonal allergic rhinitis (hay fever) is caused by pollen allergens that may come from grasses, trees, and weeds. Year-round allergic rhinitis (perennial allergic rhinitis) is caused by allergens such as house dust mites, pet dander, and mold spores.  SYMPTOMS   Nasal stuffiness (congestion).  Itchy, runny nose with sneezing and tearing of the eyes. DIAGNOSIS  Your health care provider can help you determine the allergen or allergens that trigger your symptoms. If you and your health care provider are unable to determine the allergen, skin or blood testing may be used. TREATMENT  Allergic rhinitis does not have a cure, but it can be controlled by:  Medicines and allergy shots (immunotherapy).  Avoiding the allergen. Hay fever may often be treated with antihistamines in pill or nasal spray forms. Antihistamines block the effects of histamine. There are over-the-counter medicines that may help with nasal congestion and swelling around the eyes. Check with your health care provider before taking or giving this medicine.  If avoiding the allergen or the medicine prescribed do not work, there are  many new medicines your health care provider can prescribe. Stronger medicine may be used if initial measures are ineffective. Desensitizing injections can be used if medicine and avoidance does not work. Desensitization is when a patient is given ongoing shots until the body becomes less sensitive to the allergen. Make sure you follow up with your health care provider if problems continue. HOME CARE INSTRUCTIONS It is not possible to completely avoid allergens, but you can reduce your symptoms by taking steps to limit your exposure to them. It helps to know exactly what you are allergic to so that you can avoid your specific triggers. SEEK MEDICAL CARE IF:   You have a fever.  You develop a cough that does not stop easily (persistent).  You have shortness of breath.  You start wheezing.  Symptoms interfere with normal daily activities. Document Released: 10/25/2000 Document Revised: 02/04/2013 Document Reviewed: 10/07/2012 [facility]ExitCare Patient Information 2015 [place]Care, [place]LL[place]. This information is not intended to replace advice given to you by your health care provider. Make sure you discuss any questions you have with your health care provider.

## 2013-12-30 NOTE — ED Notes (Signed)
Per pt, last week she has been having pain in left ear.  Mostly last two days.  Pt also with nasal congestion.  No fever.

## 2014-06-04 ENCOUNTER — Emergency Department (HOSPITAL_COMMUNITY)
Admission: EM | Admit: 2014-06-04 | Discharge: 2014-06-04 | Disposition: A | Discharge status: Home / Self Care | Payer: Self-pay | Attending: Emergency Medicine | Admitting: Emergency Medicine

## 2014-06-04 ENCOUNTER — Encounter (HOSPITAL_COMMUNITY): Payer: Self-pay | Admitting: Emergency Medicine

## 2014-06-04 DIAGNOSIS — R319 Hematuria, unspecified: Secondary | ICD-10-CM

## 2014-06-04 DIAGNOSIS — Z79899 Other long term (current) drug therapy: Secondary | ICD-10-CM | POA: Insufficient documentation

## 2014-06-04 DIAGNOSIS — R300 Dysuria: Secondary | ICD-10-CM

## 2014-06-04 DIAGNOSIS — Z3202 Encounter for pregnancy test, result negative: Secondary | ICD-10-CM | POA: Insufficient documentation

## 2014-06-04 DIAGNOSIS — J45909 Unspecified asthma, uncomplicated: Secondary | ICD-10-CM | POA: Insufficient documentation

## 2014-06-04 DIAGNOSIS — N390 Urinary tract infection, site not specified: Secondary | ICD-10-CM | POA: Insufficient documentation

## 2014-06-04 DIAGNOSIS — Z7951 Long term (current) use of inhaled steroids: Secondary | ICD-10-CM | POA: Insufficient documentation

## 2014-06-04 DIAGNOSIS — Z720 Tobacco use: Secondary | ICD-10-CM | POA: Insufficient documentation

## 2014-06-04 LAB — URINALYSIS, ROUTINE W REFLEX MICROSCOPIC
Bilirubin Urine: NEGATIVE
Glucose, UA: NEGATIVE mg/dL
Ketones, ur: NEGATIVE mg/dL
Nitrite: POSITIVE — AB
PH: 6 (ref 5.0–8.0)
Protein, ur: 100 mg/dL — AB
SPECIFIC GRAVITY, URINE: 1.023 (ref 1.005–1.030)
Urobilinogen, UA: 0.2 mg/dL (ref 0.0–1.0)

## 2014-06-04 LAB — URINE MICROSCOPIC-ADD ON

## 2014-06-04 LAB — POC URINE PREG, ED: PREG TEST UR: NEGATIVE

## 2014-06-04 MED ORDER — PHENAZOPYRIDINE HCL 200 MG PO TABS
200.0000 mg | ORAL_TABLET | Freq: Once | ORAL | Status: AC
  Administered 2014-06-04: 200 mg via ORAL
  Filled 2014-06-04: qty 1

## 2014-06-04 MED ORDER — CEPHALEXIN 500 MG PO CAPS: 500.0000 mg | ORAL_CAPSULE | Freq: Three times a day (TID) | ORAL | Status: DC

## 2014-06-04 MED ORDER — CEPHALEXIN 250 MG PO CAPS
250.0000 mg | ORAL_CAPSULE | Freq: Once | ORAL | Status: AC
  Administered 2014-06-04: 250 mg via ORAL
  Filled 2014-06-04: qty 1

## 2014-06-04 MED ORDER — PHENAZOPYRIDINE HCL 200 MG PO TABS: 200.0000 mg | ORAL_TABLET | Freq: Three times a day (TID) | ORAL | Status: DC

## 2014-06-04 NOTE — ED Notes (Signed)
Pt c/o low mid abd pain with dysuria onset yesterday. Frequent urination with hematuria.

## 2014-06-04 NOTE — Discharge Instructions (Signed)
Take the prescribed medication as directed. Follow-up with urology if needed for ongoing urinary issues. Return to the ED for new or worsening symptoms.

## 2014-06-04 NOTE — ED Provider Notes (Signed)
CSN: [id number]     Arrival date & time 06/04/14  0200 History   First MD Initiated Contact with Patient 06/04/14 0217     Chief Complaint  Patient presents with  . Hematuria     (Consider location/radiation/quality/duration/timing/severity/associated sxs/prior Treatment) The history is provided by the patient and medical records.    29 year old female with history of asthma, presenting with lower abdominal pain and dysuria, onset yesterday. She notes urinary frequency and hematuria. She does have history of frequent UTIs as a child, no recent issues with this. She denies any fever or chills. No pelvic pain or vaginal issues. Patient tried drinking cranberry juice prior to arrival without relief.  VSS.  Past Medical History  Diagnosis Date  . Medical history non-contributory   . Asthma    Past Surgical History  Procedure Laterality Date  . No past surgeries     Family History  Problem Relation Age of Onset  . Hypertension Mother   . Cancer Maternal Grandfather   . Cancer Paternal Grandmother   . Diabetes Paternal Grandfather    History  Substance Use Topics  . Smoking status: Current Every Day Smoker -- 0.50 packs/day  . Smokeless tobacco: Not on file  . Alcohol Use: No   OB History    Gravida Para Term Preterm AB TAB SAB Ectopic Multiple Living   Review of Systems  Genitourinary: Positive for dysuria and frequency.  All other systems reviewed and are negative.     Allergies  Review of patient's allergies indicates no known allergies.  Home Medications   Prior to Admission medications   Medication Sig Start Date End Date Taking? Authorizing Provider  albuterol (PROVENTIL HFA;VENTOLIN HFA) 108 (90 BASE) MCG/ACT inhaler Inhale 2 puffs into the lungs every 6 (six) hours as needed for wheezing.   Yes Historical Provider, MD  aspirin-acetaminophen-caffeine (EXCEDRIN MIGRAINE) [phone] MG per tablet Take 1 tablet by mouth every 6 (six) hours as  needed for pain (pain).   Yes Historical Provider, MD  ibuprofen (ADVIL,MOTRIN) 200 MG tablet Take 200 mg by mouth every 6 (six) hours as needed for moderate pain.   Yes Historical Provider, MD  fluticasone (FLONASE) 50 MCG/ACT nasal spray Place 2 sprays into both nostrils daily. Patient not taking: Reported on 06/04/2014 12/30/13   Kathrynn Speed, PA-C  guaiFENesin (MUCINEX) 600 MG 12 hr tablet Take 1 tablet (600 mg total) by mouth 2 (two) times daily. Patient not taking: Reported on 06/04/2014 12/30/13   Robyn M Hess, PA-C   BP 144/98 mmHg  Pulse 85  Temp(Src) 98.3 F (36.8 C) (Oral)  Resp 18  Ht  (1.651 m)  Wt 150 lb (68.04 kg)  BMI 24.96 kg/m2  SpO2 99%  LMP 05/27/2014   Physical Exam  Constitutional: She is oriented to person, place, and time. She appears well-developed and well-nourished.  HENT:  Head: Normocephalic and atraumatic.  Mouth/Throat: Oropharynx is clear and moist.  Eyes: Conjunctivae and EOM are normal. Pupils are equal, round, and reactive to light.  Neck: Normal range of motion. Neck supple.  Cardiovascular: Normal rate, regular rhythm and normal heart sounds.   Pulmonary/Chest: Effort normal and breath sounds normal. No respiratory distress. She has no wheezes.  Abdominal: Soft. Bowel sounds are normal. There is no tenderness. There is no guarding and no CVA tenderness.  Musculoskeletal: Normal range of motion.  Neurological: She is alert and oriented to person,  place, and time.  Skin: Skin is warm and dry.  Psychiatric: She has a normal mood and affect.  Nursing note and vitals reviewed.   ED Course  Procedures (including critical care time) Labs Review Labs Reviewed  URINALYSIS, ROUTINE W REFLEX MICROSCOPIC - Abnormal; Notable for the following:    APPearance TURBID (*)    Hgb urine dipstick LARGE (*)    Protein, ur 100 (*)    Nitrite POSITIVE (*)    Leukocytes, UA MODERATE (*)    All other components within normal limits  URINE MICROSCOPIC-ADD  ON - Abnormal; Notable for the following:    Squamous Epithelial / LPF FEW (*)    Bacteria, UA FEW (*)    All other components within normal limits  POC URINE PREG, ED    Imaging Review No results found.   EKG Interpretation None      MDM   Final diagnoses:  UTI (lower urinary tract infection)  Dysuria  Hematuria   29 year old female with lower abdominal pain and dysuria. She is afebrile and nontoxic in appearance. Her abdominal exam is benign, no CVA tenderness to suggest renal stones or pyelonephritis. Urine pregnancy negative. UA nitrite positive. Will treat with Keflex and Pyridium. Patient to follow-up with urology if needed.  Discussed plan with patient, he/she acknowledged understanding and agreed with plan of care.  Return precautions given for new or worsening symptoms.  Garlon HatchetLisa M Quenton Recendez, PA-C 06/04/14 [id number]  Paula LibraJohn Molpus, MD 06/04/14 [phone]

## 2014-06-10 ENCOUNTER — Emergency Department (HOSPITAL_COMMUNITY)
Admission: EM | Admit: 2014-06-10 | Discharge: 2014-06-11 | Disposition: A | Discharge status: Home / Self Care | Payer: Self-pay | Attending: Emergency Medicine | Admitting: Emergency Medicine

## 2014-06-10 DIAGNOSIS — Z720 Tobacco use: Secondary | ICD-10-CM | POA: Insufficient documentation

## 2014-06-10 DIAGNOSIS — Z3202 Encounter for pregnancy test, result negative: Secondary | ICD-10-CM | POA: Insufficient documentation

## 2014-06-10 DIAGNOSIS — J45909 Unspecified asthma, uncomplicated: Secondary | ICD-10-CM | POA: Insufficient documentation

## 2014-06-10 DIAGNOSIS — N12 Tubulo-interstitial nephritis, not specified as acute or chronic: Secondary | ICD-10-CM | POA: Insufficient documentation

## 2014-06-10 DIAGNOSIS — Z79899 Other long term (current) drug therapy: Secondary | ICD-10-CM | POA: Insufficient documentation

## 2014-06-10 NOTE — ED Notes (Signed)
Pt states that she was seen here on 4/21 and diagnosed with a kidney infection; pt states that she was started on an anbx and that it doesn't seem to be working; pt states that she continues to have bilateral flank pain, urinary frequency and urgency and burning with urination

## 2014-06-11 ENCOUNTER — Encounter (HOSPITAL_COMMUNITY): Payer: Self-pay | Admitting: *Deleted

## 2014-06-11 LAB — COMPREHENSIVE METABOLIC PANEL
ALK PHOS: 78 U/L (ref 39–117)
ALT: 18 U/L (ref 0–35)
AST: 28 U/L (ref 0–37)
Albumin: 4.2 g/dL (ref 3.5–5.2)
Anion gap: 9 (ref 5–15)
BUN: 8 mg/dL (ref 6–23)
CALCIUM: 8.4 mg/dL (ref 8.4–10.5)
CO2: 26 mmol/L (ref 19–32)
Chloride: 107 mmol/L (ref 96–112)
Creatinine, Ser: 0.58 mg/dL (ref 0.50–1.10)
GFR calc non Af Amer: >90 mL/min (ref >90)
Glucose, Bld: 114 mg/dL — ABNORMAL HIGH (ref 70–99)
POTASSIUM: 3.6 mmol/L (ref 3.5–5.1)
SODIUM: 142 mmol/L (ref 135–145)
Total Bilirubin: 0.5 mg/dL (ref 0.3–1.2)
Total Protein: 6.9 g/dL (ref 6.0–8.3)

## 2014-06-11 LAB — CBC WITH DIFFERENTIAL/PLATELET
BASOS PCT: 1 % (ref 0–1)
Basophils Absolute: 0.1 10*3/uL (ref 0.0–0.1)
EOS ABS: 0.4 10*3/uL (ref 0.0–0.7)
EOS PCT: 4 % (ref 0–5)
HCT: 36.7 % (ref 36.0–46.0)
Hemoglobin: 12.1 g/dL (ref 12.0–15.0)
LYMPHS ABS: 2 10*3/uL (ref 0.7–4.0)
Lymphocytes Relative: 21 % (ref 12–46)
MCH: 29.2 pg (ref 26.0–34.0)
MCHC: 33 g/dL (ref 30.0–36.0)
MCV: 88.6 fL (ref 78.0–100.0)
Monocytes Absolute: 0.7 10*3/uL (ref 0.1–1.0)
Monocytes Relative: 7 % (ref 3–12)
NEUTROS PCT: 67 % (ref 43–77)
Neutro Abs: 6.3 10*3/uL (ref 1.7–7.7)
PLATELETS: 257 10*3/uL (ref 150–400)
RBC: 4.14 MIL/uL (ref 3.87–5.11)
RDW: 12.5 % (ref 11.5–15.5)
WBC: 9.5 10*3/uL (ref 4.0–10.5)

## 2014-06-11 LAB — URINALYSIS, ROUTINE W REFLEX MICROSCOPIC
Bilirubin Urine: NEGATIVE
Glucose, UA: NEGATIVE mg/dL
Hgb urine dipstick: NEGATIVE
Ketones, ur: NEGATIVE mg/dL
NITRITE: NEGATIVE
PH: 7 (ref 5.0–8.0)
Protein, ur: NEGATIVE mg/dL
Specific Gravity, Urine: 1.021 (ref 1.005–1.030)
Urobilinogen, UA: 0.2 mg/dL (ref 0.0–1.0)

## 2014-06-11 LAB — URINE MICROSCOPIC-ADD ON

## 2014-06-11 LAB — PREGNANCY, URINE: Preg Test, Ur: NEGATIVE

## 2014-06-11 MED ORDER — SODIUM CHLORIDE 0.9 % IV BOLUS (SEPSIS)
1000.0000 mL | Freq: Once | INTRAVENOUS | Status: AC
  Administered 2014-06-11: 1000 mL via INTRAVENOUS

## 2014-06-11 MED ORDER — CIPROFLOXACIN HCL 500 MG PO TABS: 500.0000 mg | ORAL_TABLET | Freq: Two times a day (BID) | ORAL | Status: DC

## 2014-06-11 MED ORDER — HYDROCODONE-ACETAMINOPHEN 5-325 MG PO TABS: 1.0000 | ORAL_TABLET | ORAL | Status: DC | PRN

## 2014-06-11 MED ORDER — ONDANSETRON HCL 4 MG/2ML IJ SOLN
4.0000 mg | Freq: Once | INTRAMUSCULAR | Status: AC
  Administered 2014-06-11: 4 mg via INTRAVENOUS
  Filled 2014-06-11: qty 2

## 2014-06-11 MED ORDER — DEXTROSE 5 % IV SOLN
1.0000 g | Freq: Once | INTRAVENOUS | Status: AC
  Administered 2014-06-11: 1 g via INTRAVENOUS
  Filled 2014-06-11: qty 10

## 2014-06-11 MED ORDER — MORPHINE SULFATE 4 MG/ML IJ SOLN
4.0000 mg | Freq: Once | INTRAMUSCULAR | Status: AC
Start: 2014-06-11 — End: 2014-06-11
  Administered 2014-06-11: 4 mg via INTRAVENOUS
  Filled 2014-06-11: qty 1

## 2014-06-11 NOTE — Discharge Instructions (Signed)
Pyelonephritis, Adult °Pyelonephritis is a kidney infection. In general, there are 2 main types of pyelonephritis: °· Infections that come on quickly without any warning (acute pyelonephritis). °· Infections that persist for a long period of time (chronic pyelonephritis). °CAUSES  °Two main causes of pyelonephritis are: °· Bacteria traveling from the bladder to the kidney. This is a problem especially in pregnant women. The urine in the bladder can become filled with bacteria from multiple causes, including: °¨ Inflammation of the prostate gland (prostatitis). °¨ Sexual intercourse in females. °¨ Bladder infection (cystitis). °· Bacteria traveling from the bloodstream to the tissue part of the kidney. °Problems that may increase your risk of getting a kidney infection include: °· Diabetes. °· Kidney stones or bladder stones. °· Cancer. °· Catheters placed in the bladder. °· Other abnormalities of the kidney or ureter. °SYMPTOMS  °· Abdominal pain. °· Pain in the side or flank area. °· Fever. °· Chills. °· Upset stomach. °· Blood in the urine (dark urine). °· Frequent urination. °· Strong or persistent urge to urinate. °· Burning or stinging when urinating. °DIAGNOSIS  °Your caregiver may diagnose your kidney infection based on your symptoms. A urine sample may also be taken. °TREATMENT  °In general, treatment depends on how severe the infection is.  °· If the infection is mild and caught early, your caregiver may treat you with oral antibiotics and send you home. °· If the infection is more severe, the bacteria may have gotten into the bloodstream. This will require intravenous (IV) antibiotics and a hospital stay. Symptoms may include: °¨ High fever. °¨ Severe flank pain. °¨ Shaking chills. °· Even after a hospital stay, your caregiver may require you to be on oral antibiotics for a period of time. °· Other treatments may be required depending upon the cause of the infection. °HOME CARE INSTRUCTIONS  °· Take your  antibiotics as directed. Finish them even if you start to feel better. °· Make an appointment to have your urine checked to make sure the infection is gone. °· Drink enough fluids to keep your urine clear or pale yellow. °· Take medicines for the bladder if you have urgency and frequency of urination as directed by your caregiver. °SEEK IMMEDIATE MEDICAL CARE IF:  °· You have a fever or persistent symptoms for more than 2-3 days. °· You have a fever and your symptoms suddenly get worse. °· You are unable to take your antibiotics or fluids. °· You develop shaking chills. °· You experience extreme weakness or fainting. °· There is no improvement after 2 days of treatment. °MAKE SURE YOU: °· Understand these instructions. °· Will watch your condition. °· Will get help right away if you are not doing well or get worse. °Document Released: 01/30/2005 Document Revised: 08/01/2011 Document Reviewed: 07/06/2010 °[employer]® Patient Information ©2015 [employer]. This information is not intended to replace advice given to you by your health care provider. Make sure you discuss any questions you have with your health care provider. ° °

## 2014-06-11 NOTE — ED Provider Notes (Signed)
CSN: [id number]     Arrival date & time 06/10/14  2347 History   First MD Initiated Contact with Patient 06/11/14 0006     Chief Complaint  Patient presents with  . Flank Pain     (Consider location/radiation/quality/duration/timing/severity/associated sxs/prior Treatment) HPI Patient was seen on 4/21 and diagnosed with urinary tract infection. Started on Keflex. Patient states she's had persistent dysuria, frequency, suprapubic discomfort, urgency and now is having left flank pain. She admits to nausea. No fever or chills. Denies any vaginal symptoms. States she's been compliant with the antibiotic regimen which she has finished. Past Medical History  Diagnosis Date  . Medical history non-contributory   . Asthma    Past Surgical History  Procedure Laterality Date  . No past surgeries     Family History  Problem Relation Age of Onset  . Hypertension Mother   . Cancer Maternal Grandfather   . Cancer Paternal Grandmother   . Diabetes Paternal Grandfather    History  Substance Use Topics  . Smoking status: Current Every Day Smoker -- 0.50 packs/day  . Smokeless tobacco: Not on file  . Alcohol Use: No   OB History    Gravida Para Term Preterm AB TAB SAB Ectopic Multiple Living   2    1     1      Review of Systems  Constitutional: Negative for fever and chills.  Respiratory: Negative for shortness of breath.   Cardiovascular: Negative for chest pain.  Gastrointestinal: Positive for nausea and abdominal pain. Negative for vomiting, diarrhea and constipation.  Genitourinary: Positive for dysuria, frequency and flank pain. Negative for vaginal bleeding, vaginal discharge and pelvic pain.  Musculoskeletal: Positive for back pain. Negative for neck pain and neck stiffness.  Skin: Negative for rash and wound.  Neurological: Negative for dizziness, weakness, light-headedness, numbness and headaches.  All other systems reviewed and are negative.     Allergies  Review of  patient's allergies indicates no known allergies.  Home Medications   Prior to Admission medications   Medication Sig Start Date End Date Taking? Authorizing Provider  acetaminophen (TYLENOL) 500 MG tablet Take 1,000 mg by mouth every 6 (six) hours as needed for headache.   Yes Historical Provider, MD  aspirin-acetaminophen-caffeine (EXCEDRIN MIGRAINE) [phone] MG per tablet Take 1 tablet by mouth every 6 (six) hours as needed for pain (pain).   Yes Historical Provider, MD  ibuprofen (ADVIL,MOTRIN) 200 MG tablet Take 200 mg by mouth every 6 (six) hours as needed for moderate pain.   Yes Historical Provider, MD  phenazopyridine (PYRIDIUM) 200 MG tablet Take 1 tablet (200 mg total) by mouth 3 (three) times daily. 06/04/14  Yes Garlon HatchetLisa M Sanders, PA-C  albuterol (PROVENTIL HFA;VENTOLIN HFA) 108 (90 BASE) MCG/ACT inhaler Inhale 2 puffs into the lungs every 6 (six) hours as needed for wheezing.    Historical Provider, MD  cephALEXin (KEFLEX) 500 MG capsule Take 1 capsule (500 mg total) by mouth 3 (three) times daily. Patient not taking: Reported on 06/11/2014 06/04/14   Garlon HatchetLisa M Sanders, PA-C  ciprofloxacin (CIPRO) 500 MG tablet Take 1 tablet (500 mg total) by mouth 2 (two) times daily. 06/11/14   Loren Raceravid Isao Seltzer, MD  fluticasone (FLONASE) 50 MCG/ACT nasal spray Place 2 sprays into both nostrils daily. Patient not taking: Reported on 06/04/2014 12/30/13   Kathrynn Speedobyn M Hess, PA-C  guaiFENesin (MUCINEX) 600 MG 12 hr tablet Take 1 tablet (600 mg total) by mouth 2 (two) times daily. Patient not taking: Reported on  06/04/2014 12/30/13   Kathrynn Speed, PA-C  HYDROcodone-acetaminophen (NORCO) 5-325 MG per tablet Take 1 tablet by mouth every 4 (four) hours as needed for severe pain. 06/11/14   Loren Racer, MD   BP 148/101 mmHg  Pulse 98  Temp(Src) 98 F (36.7 C) (Oral)  Resp 20  SpO2 98%  LMP 05/27/2014 Physical Exam  Constitutional: She is oriented to person, place, and time. She appears well-developed and  well-nourished. No distress.  HENT:  Head: Normocephalic and atraumatic.  Mouth/Throat: Oropharynx is clear and moist. No oropharyngeal exudate.  Eyes: EOM are normal. Pupils are equal, round, and reactive to light.  Neck: Normal range of motion. Neck supple.  Cardiovascular: Normal rate and regular rhythm.   Pulmonary/Chest: Effort normal and breath sounds normal. No respiratory distress. She has no wheezes. She has no rales.  Abdominal: Soft. Bowel sounds are normal. She exhibits no distension and no mass. There is no tenderness. There is no rebound and no guarding.  Musculoskeletal: Normal range of motion. She exhibits tenderness. She exhibits no edema.  Left-sided CVA tenderness  Neurological: She is alert and oriented to person, place, and time.  Skin: Skin is warm and dry. No rash noted. No erythema.  Psychiatric: She has a normal mood and affect. Her behavior is normal.  Nursing note and vitals reviewed.   ED Course  Procedures (including critical care time) Labs Review Labs Reviewed  COMPREHENSIVE METABOLIC PANEL - Abnormal; Notable for the following:    Glucose, Bld 114 (*)    All other components within normal limits  URINALYSIS, ROUTINE W REFLEX MICROSCOPIC - Abnormal; Notable for the following:    APPearance CLOUDY (*)    Leukocytes, UA SMALL (*)    All other components within normal limits  URINE MICROSCOPIC-ADD ON - Abnormal; Notable for the following:    Squamous Epithelial / LPF MANY (*)    Bacteria, UA FEW (*)    All other components within normal limits  CBC WITH DIFFERENTIAL/PLATELET  PREGNANCY, URINE    Imaging Review No results found.   EKG Interpretation None      MDM   Final diagnoses:  Pyelonephritis    Patient is nontoxic appearing. Left-sided CVA tenderness on exam. Consistent with pyelonephritis. Given IV Rocephin but anticipate discharge home.    Loren Racer, MD 06/11/14 [phone]

## 2015-01-09 ENCOUNTER — Emergency Department
Admission: EM | Admit: 2015-01-09 | Discharge: 2015-01-09 | Disposition: A | Discharge status: Home / Self Care | Payer: Self-pay | Attending: Student | Admitting: Student

## 2015-01-09 ENCOUNTER — Encounter: Payer: Self-pay | Admitting: Emergency Medicine

## 2015-01-09 DIAGNOSIS — L259 Unspecified contact dermatitis, unspecified cause: Secondary | ICD-10-CM | POA: Insufficient documentation

## 2015-01-09 DIAGNOSIS — Z79899 Other long term (current) drug therapy: Secondary | ICD-10-CM | POA: Insufficient documentation

## 2015-01-09 DIAGNOSIS — F17200 Nicotine dependence, unspecified, uncomplicated: Secondary | ICD-10-CM | POA: Insufficient documentation

## 2015-01-09 DIAGNOSIS — Z792 Long term (current) use of antibiotics: Secondary | ICD-10-CM | POA: Insufficient documentation

## 2015-01-09 MED ORDER — CLOBETASOL PROPIONATE 0.05 % EX CREA: 1.0000 "application " | TOPICAL_CREAM | Freq: Two times a day (BID) | CUTANEOUS | Status: DC

## 2015-01-09 NOTE — ED Provider Notes (Signed)
[facility] Emergency Department Provider Note  ____________________________________________  Time seen: Approximately 6:23 AM  I have reviewed the triage vital signs and the nursing notes.   HISTORY  Chief Complaint Urticaria    HPI Evlyn ClinesBlair F Ratley is a 29 y.o. female with history of asthma who presents with for evaluation of a small area of rash localized to the right shin which is pruritic in nature, gradual onset 1 week ago, constant since onset, currently moderate, no modifying factors. States her significant other was exposed to poison ivy. She was also walking the same area as him however was fully clothed does not know how poison ivy would make contact with her skin on her shin. Right over-the-counter medications to help with itching however nothing is a helpful. No fevers, chills, vomiting, diarrhea, chest pain or difficulty breathing. No other complaints.   Past Medical History  Diagnosis Date  . Medical history non-contributory   . Asthma     There are no active problems to display for this patient.   Past Surgical History  Procedure Laterality Date  . No past surgeries      Current Outpatient Rx  Name  Route  Sig  Dispense  Refill  . acetaminophen (TYLENOL) 500 MG tablet   Oral   Take 1,000 mg by mouth every 6 (six) hours as needed for headache.         . albuterol (PROVENTIL HFA;VENTOLIN HFA) 108 (90 BASE) MCG/ACT inhaler   Inhalation   Inhale 2 puffs into the lungs every 6 (six) hours as needed for wheezing.         Marland Kitchen. aspirin-acetaminophen-caffeine (EXCEDRIN MIGRAINE) 250-250-65 MG per tablet   Oral   Take 1 tablet by mouth every 6 (six) hours as needed for pain (pain).         . cephALEXin (KEFLEX) 500 MG capsule   Oral   Take 1 capsule (500 mg total) by mouth 3 (three) times daily. Patient not taking: Reported on 06/11/2014   21 capsule   0   . ciprofloxacin (CIPRO) 500 MG tablet   Oral   Take 1 tablet (500 mg total)  by mouth 2 (two) times daily.   14 tablet   0   . clobetasol cream (TEMOVATE) 0.05 %   Topical   Apply 1 application topically 2 (two) times daily.   30 g   0   . fluticasone (FLONASE) 50 MCG/ACT nasal spray   Each Nare   Place 2 sprays into both nostrils daily. Patient not taking: Reported on 06/04/2014   16 g   0   . guaiFENesin (MUCINEX) 600 MG 12 hr tablet   Oral   Take 1 tablet (600 mg total) by mouth 2 (two) times daily. Patient not taking: Reported on 06/04/2014   12 tablet   2   . HYDROcodone-acetaminophen (NORCO) 5-325 MG per tablet   Oral   Take 1 tablet by mouth every 4 (four) hours as needed for severe pain.   10 tablet   0   . ibuprofen (ADVIL,MOTRIN) 200 MG tablet   Oral   Take 200 mg by mouth every 6 (six) hours as needed for moderate pain.         . phenazopyridine (PYRIDIUM) 200 MG tablet   Oral   Take 1 tablet (200 mg total) by mouth 3 (three) times daily.   10 tablet   0     Allergies Review of patient's allergies indicates no known allergies.  Family History  Problem Relation Age of Onset  . Hypertension Mother   . Cancer Maternal Grandfather   . Cancer Paternal Grandmother   . Diabetes Paternal Grandfather     Social History Social History  Substance Use Topics  . Smoking status: Current Every Day Smoker -- 0.50 packs/day  . Smokeless tobacco: None  . Alcohol Use: No    Review of Systems Constitutional: No fever/chills Eyes: No visual changes. ENT: No sore throat. Cardiovascular: Denies chest pain. Respiratory: Denies shortness of breath. Gastrointestinal: No abdominal pain.  No nausea, no vomiting.  No diarrhea.  No constipation. Genitourinary: Negative for dysuria. Musculoskeletal: Negative for back pain. Skin: +for rash. Neurological: Negative for headaches, focal weakness or numbness.  10-point ROS otherwise negative.  ____________________________________________   PHYSICAL EXAM:  VITAL SIGNS: ED Triage Vitals   Enc Vitals Group     BP 01/09/15 0218 136/95 mmHg     Pulse Rate 01/09/15 0218 69     Resp 01/09/15 0218 18     Temp 01/09/15 0218 98.3 F (36.8 C)     Temp Source 01/09/15 0218 Oral     SpO2 01/09/15 0218 98 %     Weight 01/09/15 0218 165 lb (74.844 kg)     Height 01/09/15 0218  (1.651 m)     Head Cir --      Peak Flow --      Pain Score 01/09/15 0221 10     Pain Loc --      Pain Edu? --      Excl. in GC? --     Constitutional: Alert and oriented. Well appearing and in no acute distress. Eyes: Conjunctivae are normal. PERRL. EOMI. Head: Atraumatic. Nose: No congestion/rhinnorhea. Mouth/Throat: Mucous membranes are moist.  Oropharynx non-erythematous. Neck: No stridor.  Cardiovascular: Normal rate, regular rhythm. Grossly normal heart sounds.  Good peripheral circulation. Respiratory: Normal respiratory effort.  No retractions. Lungs CTAB. Gastrointestinal: Soft and nontender. No distention. No abdominal bruits. No CVA tenderness. Genitourinary: deferred Musculoskeletal: No lower extremity tenderness nor edema.  No joint effusions. Neurologic:  Normal speech and language. No gross focal neurologic deficits are appreciated. No gait instability. Skin:  Skin is warm, dry and intact. Nonblanching excoriated papular rash with some vesicles in a very small area on the right anterior shin measuring approximately 4 7 m x 5 cm. No rash/lesions on any other portion of the body. Psychiatric: Mood and affect are normal. Speech and behavior are normal.  ____________________________________________   LABS (all labs ordered are listed, but only abnormal results are displayed)  Labs Reviewed - No data to display ____________________________________________  EKG  none ____________________________________________  RADIOLOGY  none ____________________________________________   PROCEDURES  Procedure(s) performed: None  Critical Care performed:  No  ____________________________________________   INITIAL IMPRESSION / ASSESSMENT AND PLAN / ED COURSE  Pertinent labs & imaging results that were available during my care of the patient were reviewed by me and considered in my medical decision making (see chart for details).  ANNELYSE REY is a 29 y.o. female with history of asthma who presents with for evaluation of a small area of rash localized to the right shin which is pruritic in nature, gradual onset 1 week ago, constant since onset, currently moderate, no modifying factors. On exam, she is very well-appearing and in no acute distress. Vital signs stable, she is afebrile. She does appear to have contact dermatitis likely related to poison ivy on a very small portion of the  right shin. We'll DC with topical clobetasol. She is concerned about oral steroids however given that she is uncertain of whether not she is pregnant however I do not think she needs oral steroids given the very limited affected area on the right lower leg. Topical clobetasol is appropriate treatment and should be safe in her case (even if she is pregnant) given very limited area of application which significantly reduces risk of systemic absorption. DC with return precautions. ____________________________________________   FINAL CLINICAL IMPRESSION(S) / ED DIAGNOSES  Final diagnoses:  Contact dermatitis      Gayla Doss, MD 01/09/15 [phone]

## 2015-01-09 NOTE — ED Notes (Signed)
Pt. States she has multiple allergies.  Pt. States she noticed some itches hives on rt. Leg that started one week ago.  Pt. States hives have spread to all limbs and torso.  Pt. States she might have been exposed to poison ivy.  Pt. States OTC medications have not given relief.  Pt. States unsure if pregnant.

## 2015-01-15 ENCOUNTER — Emergency Department
Admission: EM | Admit: 2015-01-15 | Discharge: 2015-01-15 | Disposition: A | Discharge status: Home / Self Care | Payer: Self-pay | Attending: Emergency Medicine | Admitting: Emergency Medicine

## 2015-01-15 DIAGNOSIS — Z79899 Other long term (current) drug therapy: Secondary | ICD-10-CM | POA: Insufficient documentation

## 2015-01-15 DIAGNOSIS — F17200 Nicotine dependence, unspecified, uncomplicated: Secondary | ICD-10-CM | POA: Insufficient documentation

## 2015-01-15 DIAGNOSIS — L237 Allergic contact dermatitis due to plants, except food: Secondary | ICD-10-CM | POA: Insufficient documentation

## 2015-01-15 DIAGNOSIS — Z3202 Encounter for pregnancy test, result negative: Secondary | ICD-10-CM | POA: Insufficient documentation

## 2015-01-15 DIAGNOSIS — Z792 Long term (current) use of antibiotics: Secondary | ICD-10-CM | POA: Insufficient documentation

## 2015-01-15 LAB — POCT PREGNANCY, URINE: PREG TEST UR: NEGATIVE

## 2015-01-15 MED ORDER — PREDNISONE 20 MG PO TABS
ORAL_TABLET | ORAL | Status: AC
  Administered 2015-01-15: 60 mg via ORAL
  Filled 2015-01-15: qty 3

## 2015-01-15 MED ORDER — PREDNISONE 20 MG PO TABS
60.0000 mg | ORAL_TABLET | Freq: Once | ORAL | Status: AC
  Administered 2015-01-15: 60 mg via ORAL

## 2015-01-15 MED ORDER — PREDNISONE 20 MG PO TABS: 60.0000 mg | ORAL_TABLET | Freq: Every day | ORAL | Status: AC

## 2015-01-15 NOTE — Discharge Instructions (Signed)

## 2015-01-15 NOTE — ED Notes (Signed)
Dr. Brown at bedside

## 2015-01-15 NOTE — ED Notes (Addendum)
Pt states her menses is late. Pt mentioned taking Prednisone, said Dr. Inocencio HomesGayle mentioned it last time pt was seen here. Dr. Manson PasseyBrown mentions Prednisone now, explains side effects. Dr. Manson PasseyBrown suggests urine to determine if pregnant because last menses was late October.

## 2015-01-15 NOTE — ED Provider Notes (Signed)
[facility] Emergency Department Provider Note  ____________________________________________  Time seen: 4:40AM  I have reviewed the triage vital signs and the nursing notes.   HISTORY  Chief Complaint Poison Ivy     HPI CYLIE DOR is a 29 y.o. female presents with persistent poison ivy 3 weeks. Patient states she was seen in emergency department week ago and prescribed topical cream however she was unable to fill her prescription secondary to cost. Patient admits her pruritic rash that is consistent with poison ivy bilateral lower extremity and bilateral flank     Past Medical History  Diagnosis Date  . Medical history non-contributory   . Asthma     There are no active problems to display for this patient.   Past Surgical History  Procedure Laterality Date  . No past surgeries      Current Outpatient Rx  Name  Route  Sig  Dispense  Refill  . acetaminophen (TYLENOL) 500 MG tablet   Oral   Take 1,000 mg by mouth every 6 (six) hours as needed for headache.         . albuterol (PROVENTIL HFA;VENTOLIN HFA) 108 (90 BASE) MCG/ACT inhaler   Inhalation   Inhale 2 puffs into the lungs every 6 (six) hours as needed for wheezing.         Marland Kitchen aspirin-acetaminophen-caffeine (EXCEDRIN MIGRAINE) 250-250-65 MG per tablet   Oral   Take 1 tablet by mouth every 6 (six) hours as needed for pain (pain).         . cephALEXin (KEFLEX) 500 MG capsule   Oral   Take 1 capsule (500 mg total) by mouth 3 (three) times daily. Patient not taking: Reported on 06/11/2014   21 capsule   0   . ciprofloxacin (CIPRO) 500 MG tablet   Oral   Take 1 tablet (500 mg total) by mouth 2 (two) times daily.   14 tablet   0   . clobetasol cream (TEMOVATE) 0.05 %   Topical   Apply 1 application topically 2 (two) times daily.   30 g   0   . fluticasone (FLONASE) 50 MCG/ACT nasal spray   Each Nare   Place 2 sprays into both nostrils daily. Patient not taking:  Reported on 06/04/2014   16 g   0   . guaiFENesin (MUCINEX) 600 MG 12 hr tablet   Oral   Take 1 tablet (600 mg total) by mouth 2 (two) times daily. Patient not taking: Reported on 06/04/2014   12 tablet   2   . HYDROcodone-acetaminophen (NORCO) 5-325 MG per tablet   Oral   Take 1 tablet by mouth every 4 (four) hours as needed for severe pain.   10 tablet   0   . ibuprofen (ADVIL,MOTRIN) 200 MG tablet   Oral   Take 200 mg by mouth every 6 (six) hours as needed for moderate pain.         . phenazopyridine (PYRIDIUM) 200 MG tablet   Oral   Take 1 tablet (200 mg total) by mouth 3 (three) times daily.   10 tablet   0     Allergies No known drug allergies  Family History  Problem Relation Age of Onset  . Hypertension Mother   . Cancer Maternal Grandfather   . Cancer Paternal Grandmother   . Diabetes Paternal Grandfather     Social History Social History  Substance Use Topics  . Smoking status: Current Every Day Smoker -- 0.50  packs/day  . Smokeless tobacco: Not on file  . Alcohol Use: No    Review of Systems  Constitutional: Negative for fever. Eyes: Negative for visual changes. ENT: Negative for sore throat. Cardiovascular: Negative for chest pain. Respiratory: Negative for shortness of breath. Gastrointestinal: Negative for abdominal pain, vomiting and diarrhea. Genitourinary: Negative for dysuria. Musculoskeletal: Negative for back pain. Skin: Negative for rash. Neurological: Negative for headaches, focal weakness or numbness.   10-point ROS otherwise negative.  ____________________________________________   PHYSICAL EXAM:  VITAL SIGNS: ED Triage Vitals  Enc Vitals Group     BP 01/15/15 0441 136/102 mmHg     Pulse Rate 01/15/15 0441 100     Resp 01/15/15 0441 18     Temp 01/15/15 0441 99 F (37.2 C)     Temp Source 01/15/15 0441 Oral     SpO2 01/15/15 0441 100 %     Weight 01/15/15 0441 160 lb (72.576 kg)     Height 01/15/15 0441 5\' 5"   (1.651 m)     Head Cir --      Peak Flow --      Pain Score 01/15/15 0441 0     Pain Loc --      Pain Edu? --      Excl. in GC? --      Constitutional: Alert and oriented. Well appearing and in no distress. Eyes: Conjunctivae are normal. PERRL. Normal extraocular movements. ENT   Head: Normocephalic and atraumatic.   Nose: No congestion/rhinnorhea.   Mouth/Throat: Mucous membranes are moist.   Neck: No stridor. Hematological/Lymphatic/Immunilogical: No cervical lymphadenopathy. Cardiovascular: Normal rate, regular rhythm. Normal and symmetric distal pulses are present in all extremities. No murmurs, rubs, or gallops. Respiratory: Normal respiratory effort without tachypnea nor retractions. Breath sounds are clear and equal bilaterally. No wheezes/rales/rhonchi. Gastrointestinal: Soft and nontender. No distention. There is no CVA tenderness. Genitourinary: deferred Musculoskeletal: Nontender with normal range of motion in all extremities. No joint effusions.  No lower extremity tenderness nor edema. Neurologic:  Normal speech and language. No gross focal neurologic deficits are appreciated. Speech is normal.  Skin:  Skin is warm, dry and intact. Linear vesicular rash noted bilateral extremity and flank consistent with poison ivy Psychiatric: Mood and affect are normal. Speech and behavior are normal. Patient exhibits appropriate insight and judgment.     INITIAL IMPRESSION / ASSESSMENT AND PLAN / ED COURSE  Pertinent labs & imaging results that were available during my care of the patient were reviewed by me and considered in my medical decision making (see chart for details).    ____________________________________________   FINAL CLINICAL IMPRESSION(S) / ED DIAGNOSES  Final diagnoses:  Poison ivy      Darci Currentandolph N Shaman Muscarella, MD 01/15/15 [phone]

## 2015-01-15 NOTE — ED Notes (Signed)
Pt was exposed to poison ivy 3 weeks ago and was seen here a week ago and got a rx for a topical cream that states was too expensive.  Here for a cheaper prescription.

## 2015-02-14 NOTE — L&D Delivery Note (Signed)
Delivery Note At 3:47 PM a viable female was delivered via Vaginal, Spontaneous Delivery (Presentation: OA to LOA;  ).  APGAR: 8, 9; weight pending .   Placenta status: intact with gentle traction; 3VC  Anesthesia:  none Episiotomy: None Lacerations: 1st degree, hemostatic unrepaired Suture Repair:  Est. Blood Loss (mL): 300  Mom to postpartum.  Baby to Couplet care / Skin to Skin.  Michele GaribaldiKathryn Lorraine Renise Gardner 11/30/2015, 4:39 PM

## 2015-03-29 ENCOUNTER — Inpatient Hospital Stay (HOSPITAL_COMMUNITY)
Admission: AD | Admit: 2015-03-29 | Discharge: 2015-03-29 | Disposition: A | Discharge status: Home / Self Care | Payer: Medicaid Other | Source: Ambulatory Visit | Attending: Family Medicine | Admitting: Family Medicine

## 2015-03-29 ENCOUNTER — Inpatient Hospital Stay (HOSPITAL_COMMUNITY): Payer: Medicaid Other

## 2015-03-29 ENCOUNTER — Encounter (HOSPITAL_COMMUNITY): Payer: Self-pay

## 2015-03-29 DIAGNOSIS — R102 Pelvic and perineal pain: Secondary | ICD-10-CM

## 2015-03-29 DIAGNOSIS — O26891 Other specified pregnancy related conditions, first trimester: Secondary | ICD-10-CM | POA: Diagnosis not present

## 2015-03-29 DIAGNOSIS — Z3A01 Less than 8 weeks gestation of pregnancy: Secondary | ICD-10-CM | POA: Diagnosis not present

## 2015-03-29 DIAGNOSIS — O99331 Smoking (tobacco) complicating pregnancy, first trimester: Secondary | ICD-10-CM | POA: Diagnosis not present

## 2015-03-29 DIAGNOSIS — O3680X0 Pregnancy with inconclusive fetal viability, not applicable or unspecified: Secondary | ICD-10-CM

## 2015-03-29 DIAGNOSIS — F17210 Nicotine dependence, cigarettes, uncomplicated: Secondary | ICD-10-CM | POA: Insufficient documentation

## 2015-03-29 LAB — WET PREP, GENITAL
SPERM: NONE SEEN
TRICH WET PREP: NONE SEEN
YEAST WET PREP: NONE SEEN

## 2015-03-29 LAB — CBC
HEMATOCRIT: 37.2 % (ref 36.0–46.0)
Hemoglobin: 12.7 g/dL (ref 12.0–15.0)
MCH: 29.7 pg (ref 26.0–34.0)
MCHC: 34.1 g/dL (ref 30.0–36.0)
MCV: 87.1 fL (ref 78.0–100.0)
PLATELETS: 286 10*3/uL (ref 150–400)
RBC: 4.27 MIL/uL (ref 3.87–5.11)
RDW: 13.1 % (ref 11.5–15.5)
WBC: 9.3 10*3/uL (ref 4.0–10.5)

## 2015-03-29 LAB — URINALYSIS, ROUTINE W REFLEX MICROSCOPIC
Bilirubin Urine: NEGATIVE
Glucose, UA: NEGATIVE mg/dL
HGB URINE DIPSTICK: NEGATIVE
Ketones, ur: NEGATIVE mg/dL
Leukocytes, UA: NEGATIVE
NITRITE: NEGATIVE
PROTEIN: NEGATIVE mg/dL
Specific Gravity, Urine: 1.02 (ref 1.005–1.030)
pH: 7 (ref 5.0–8.0)

## 2015-03-29 LAB — POCT PREGNANCY, URINE: Preg Test, Ur: POSITIVE — AB

## 2015-03-29 LAB — HCG, QUANTITATIVE, PREGNANCY: hCG, Beta Chain, Quant, S: 2711 m[IU]/mL — ABNORMAL HIGH (ref <5)

## 2015-03-29 NOTE — Discharge Instructions (Signed)

## 2015-03-29 NOTE — MAU Provider Note (Signed)
History     CSN: [id number]  Arrival date and time: 03/29/15 3086   First Provider Initiated Contact with Patient 03/29/15 2040      Chief Complaint  Patient presents with  . Abdominal Pain   Pelvic Pain The patient's primary symptoms include pelvic pain. This is a new problem. The current episode started in the past 7 days. The problem occurs intermittently. The problem has been unchanged. Pain severity now: 9/10  The problem affects both sides. She is pregnant. Associated symptoms include abdominal pain and nausea. Pertinent negatives include no chills, constipation, diarrhea, dysuria, fever, frequency, urgency or vomiting. Nothing aggravates the symptoms. She has tried nothing for the symptoms. She is sexually active. It is unknown whether or not her partner has an STD. She uses nothing for contraception. Her menstrual history has been regular (LMP: 02/22/15 ).    Past Medical History  Diagnosis Date  . Medical history non-contributory   . Asthma     Past Surgical History  Procedure Laterality Date  . No past surgeries      Family History  Problem Relation Age of Onset  . Hypertension Mother   . Cancer Maternal Grandfather   . Cancer Paternal Grandmother   . Diabetes Paternal Grandfather     Social History  Substance Use Topics  . Smoking status: Current Every Day Smoker -- 0.50 packs/day  . Smokeless tobacco: None  . Alcohol Use: No    Allergies:  Allergies  Allergen Reactions  . Bee Venom Anaphylaxis    Prescriptions prior to admission  Medication Sig Dispense Refill Last Dose  . acetaminophen (TYLENOL) 500 MG tablet Take 1,000 mg by mouth every 6 (six) hours as needed for headache.   06/10/2014 at Unknown time  . albuterol (PROVENTIL HFA;VENTOLIN HFA) 108 (90 BASE) MCG/ACT inhaler Inhale 2 puffs into the lungs every 6 (six) hours as needed for wheezing.   PRN  . aspirin-acetaminophen-caffeine (EXCEDRIN MIGRAINE) 250-250-65 MG per tablet Take 1 tablet by mouth  every 6 (six) hours as needed for pain (pain).   Past Week at Unknown time  . cephALEXin (KEFLEX) 500 MG capsule Take 1 capsule (500 mg total) by mouth 3 (three) times daily. (Patient not taking: Reported on 06/11/2014) 21 capsule 0 Completed Course at Unknown time  . ciprofloxacin (CIPRO) 500 MG tablet Take 1 tablet (500 mg total) by mouth 2 (two) times daily. 14 tablet 0   . clobetasol cream (TEMOVATE) 0.05 % Apply 1 application topically 2 (two) times daily. 30 g 0   . fluticasone (FLONASE) 50 MCG/ACT nasal spray Place 2 sprays into both nostrils daily. (Patient not taking: Reported on 06/04/2014) 16 g 0   . guaiFENesin (MUCINEX) 600 MG 12 hr tablet Take 1 tablet (600 mg total) by mouth 2 (two) times daily. (Patient not taking: Reported on 06/04/2014) 12 tablet 2   . HYDROcodone-acetaminophen (NORCO) 5-325 MG per tablet Take 1 tablet by mouth every 4 (four) hours as needed for severe pain. 10 tablet 0   . ibuprofen (ADVIL,MOTRIN) 200 MG tablet Take 200 mg by mouth every 6 (six) hours as needed for moderate pain.   Past Week at Unknown time  . phenazopyridine (PYRIDIUM) 200 MG tablet Take 1 tablet (200 mg total) by mouth 3 (three) times daily. 10 tablet 0 Past Week at Unknown time    Review of Systems  Constitutional: Negative for fever and chills.  Gastrointestinal: Positive for nausea and abdominal pain. Negative for vomiting, diarrhea and constipation.  Genitourinary:  Positive for pelvic pain. Negative for dysuria, urgency and frequency.   Physical Exam   Blood pressure 134/78, pulse 83, temperature 98.2 F (36.8 C), temperature source Oral, resp. rate 16, last menstrual period 02/22/2015.  Physical Exam  Nursing note and vitals reviewed. Constitutional: She is oriented to person, place, and time. She appears well-developed and well-nourished. No distress.  HENT:  Head: Normocephalic.  Cardiovascular: Normal rate.   Respiratory: Effort normal.  GI: There is no tenderness. There is no  rebound.  Neurological: She is alert and oriented to person, place, and time.  Skin: Skin is warm and dry.  Psychiatric: She has a normal mood and affect.   Results for orders placed or performed during the hospital encounter of 03/29/15 (from the past 24 hour(s))  Urinalysis, Routine w reflex microscopic (not at [facility])     Status: None   Collection Time: 03/29/15  8:08 PM  Result Value Ref Range   Color, Urine YELLOW YELLOW   APPearance CLEAR CLEAR   Specific Gravity, Urine 1.020 1.005 - 1.030   pH 7.0 5.0 - 8.0   Glucose, UA NEGATIVE NEGATIVE mg/dL   Hgb urine dipstick NEGATIVE NEGATIVE   Bilirubin Urine NEGATIVE NEGATIVE   Ketones, ur NEGATIVE NEGATIVE mg/dL   Protein, ur NEGATIVE NEGATIVE mg/dL   Nitrite NEGATIVE NEGATIVE   Leukocytes, UA NEGATIVE NEGATIVE  Pregnancy, urine POC     Status: Abnormal   Collection Time: 03/29/15  8:16 PM  Result Value Ref Range   Preg Test, Ur POSITIVE (A) NEGATIVE  CBC     Status: None   Collection Time: 03/29/15  8:48 PM  Result Value Ref Range   WBC 9.3 4.0 - 10.5 K/uL   RBC 4.27 3.87 - 5.11 MIL/uL   Hemoglobin 12.7 12.0 - 15.0 g/dL   HCT 16.1 09.6 - 04.5 %   MCV 87.1 78.0 - 100.0 fL   MCH 29.7 26.0 - 34.0 pg   MCHC 34.1 30.0 - 36.0 g/dL   RDW 40.9 81.1 - 91.4 %   Platelets 286 150 - 400 K/uL  hCG, quantitative, pregnancy     Status: Abnormal   Collection Time: 03/29/15  8:48 PM  Result Value Ref Range   hCG, Beta Chain, Quant, S 2711 (H) <5 mIU/mL  Wet prep, genital     Status: Abnormal   Collection Time: 03/29/15  9:00 PM  Result Value Ref Range   Yeast Wet Prep HPF POC NONE SEEN NONE SEEN   Trich, Wet Prep NONE SEEN NONE SEEN   Clue Cells Wet Prep HPF POC PRESENT (A) NONE SEEN   WBC, Wet Prep HPF POC FEW (A) NONE SEEN   Sperm NONE SEEN    US Ob Comp Less 14 Wks  03/29/2015  CLINICAL DATA:  30 year old female reportedly 5 weeks 0 days pregnant with lower abdominal pain for 1 week. Quantitative beta HCG 2,711. EXAM: OBSTETRIC <14  WK [place] AND TRANSVAGINAL OB US TECHNIQUE: Both transabdominal and transvaginal ultrasound examinations were performed for complete evaluation of the gestation as well as the maternal uterus, adnexal regions, and pelvic cul-de-sac. Transvaginal technique was performed to assess early pregnancy. COMPARISON:  None. FINDINGS: Intrauterine gestational sac: Single small fluid collection in the subendometrial space consistent with an intrauterine gestational sac. Yolk sac:  Not visualized Embryo:  Not visualized MSD: 4.1  mm   5 w   1  d Subchorionic hemorrhage:  None visualized. Maternal uterus/adnexae: Normal appearance of the bilateral ovaries. No free fluid. IMPRESSION:  Probable early intrauterine gestational sac, but no yolk sac, fetal pole, or cardiac activity yet visualized. Recommend follow-up quantitative B-HCG levels and follow-up US in 14 days to confirm and assess viability. This recommendation follows SRU consensus guidelines: Diagnostic Criteria for Nonviable Pregnancy Early in the First Trimester. Malva Limes Med 2013; 161:0960-45. Electronically Signed   By: Malachy Moan M.D.   On: 03/29/2015 22:38   US Ob Transvaginal  03/29/2015  CLINICAL DATA:  30 year old female reportedly 5 weeks 0 days pregnant with lower abdominal pain for 1 week. Quantitative beta HCG 2,711. EXAM: OBSTETRIC <14 WK [place] AND TRANSVAGINAL OB US TECHNIQUE: Both transabdominal and transvaginal ultrasound examinations were performed for complete evaluation of the gestation as well as the maternal uterus, adnexal regions, and pelvic cul-de-sac. Transvaginal technique was performed to assess early pregnancy. COMPARISON:  None. FINDINGS: Intrauterine gestational sac: Single small fluid collection in the subendometrial space consistent with an intrauterine gestational sac. Yolk sac:  Not visualized Embryo:  Not visualized MSD: 4.1  mm   5 w   1  d Subchorionic hemorrhage:  None visualized. Maternal uterus/adnexae: Normal appearance of the  bilateral ovaries. No free fluid. IMPRESSION: Probable early intrauterine gestational sac, but no yolk sac, fetal pole, or cardiac activity yet visualized. Recommend follow-up quantitative B-HCG levels and follow-up US in 14 days to confirm and assess viability. This recommendation follows SRU consensus guidelines: Diagnostic Criteria for Nonviable Pregnancy Early in the First Trimester. Malva Limes Med 2013; 409:8119-14. Electronically Signed   By: Malachy Moan M.D.   On: 03/29/2015 22:38    MAU Course  Procedures  MDM   Assessment and Plan   1. Pregnancy of unknown anatomic location   2. Pelvic pain affecting pregnancy in first trimester, antepartum    DC home Comfort measures reviewed  1st Trimester precautions  Bleeding precautions Ectopic precautions RX: none    Follow-up Information    Follow up with [facility].   Specialty:  Obstetrics and Gynecology   Why:  04/01/15 at 11:00 AM    Contact information:   [address] [place] [address] [phone]        Tawnya Crook 03/29/2015, 8:41 PM

## 2015-03-29 NOTE — MAU Note (Signed)
Pt c/o lower abdominal pain x1 week. Feels like tightening and stretching. Rates 8/10. Denies vag bleeding or discharge. LMP: 02/22/2015.

## 2015-03-30 LAB — GC/CHLAMYDIA PROBE AMP (~~LOC~~) NOT AT ARMC
Chlamydia: NEGATIVE
Neisseria Gonorrhea: NEGATIVE

## 2015-03-30 LAB — HIV ANTIBODY (ROUTINE TESTING W REFLEX): HIV Screen 4th Generation wRfx: NONREACTIVE

## 2015-03-30 LAB — RPR: RPR Ser Ql: NONREACTIVE

## 2015-04-01 ENCOUNTER — Encounter: Payer: Self-pay | Admitting: Obstetrics & Gynecology

## 2015-04-01 ENCOUNTER — Ambulatory Visit: Payer: Self-pay | Admitting: Advanced Practice Midwife

## 2015-04-01 ENCOUNTER — Ambulatory Visit (INDEPENDENT_AMBULATORY_CARE_PROVIDER_SITE_OTHER): Payer: Medicaid Other | Admitting: Obstetrics & Gynecology

## 2015-04-01 DIAGNOSIS — O3680X0 Pregnancy with inconclusive fetal viability, not applicable or unspecified: Secondary | ICD-10-CM

## 2015-04-01 DIAGNOSIS — R109 Unspecified abdominal pain: Secondary | ICD-10-CM

## 2015-04-01 DIAGNOSIS — O9989 Other specified diseases and conditions complicating pregnancy, childbirth and the puerperium: Secondary | ICD-10-CM | POA: Diagnosis present

## 2015-04-01 LAB — HCG, QUANTITATIVE, PREGNANCY: hCG, Beta Chain, Quant, S: 9334 m[IU]/mL — ABNORMAL HIGH (ref <5)

## 2015-04-01 NOTE — Progress Notes (Signed)
Scheduled f/u OB US for March 2 @ 1045.  Pt notified.

## 2015-04-01 NOTE — Progress Notes (Signed)
Patient ID: Michele Gardner, female   DOB: Mar 21, 1985, 30 y.o.   MRN: [id number] History:  30 y.o. [id number] here today for eval of pregnancy of unknown location.  She reports min cramping 'like menstrual cramps'  She denies bleeding   The following portions of the patient's history were reviewed and updated as appropriate: allergies, current medications, past family history, past medical history, past social history, past surgical history and problem list.  Review of Systems:  Pertinent items are noted in HPI.  Objective:  Physical Exam Last menstrual period 02/22/2015. Gen: NAD Exam deferred   Component     Latest Ref Rng 03/29/2015 04/01/2015  HCG, Beta Chain, Quant, S     <5 mIU/mL 2711 (H) 9334 (H)    Labs and Imaging US Ob Comp Less 14 Wks  03/29/2015  CLINICAL DATA:  30 year old female reportedly 5 weeks 0 days pregnant with lower abdominal pain for 1 week. Quantitative beta HCG 2,711. EXAM: OBSTETRIC <14 WK [place] AND TRANSVAGINAL OB US TECHNIQUE: Both transabdominal and transvaginal ultrasound examinations were performed for complete evaluation of the gestation as well as the maternal uterus, adnexal regions, and pelvic cul-de-sac. Transvaginal technique was performed to assess early pregnancy. COMPARISON:  None. FINDINGS: Intrauterine gestational sac: Single small fluid collection in the subendometrial space consistent with an intrauterine gestational sac. Yolk sac:  Not visualized Embryo:  Not visualized MSD: 4.1  mm   5 w   1  d Subchorionic hemorrhage:  None visualized. Maternal uterus/adnexae: Normal appearance of the bilateral ovaries. No free fluid. IMPRESSION: Probable early intrauterine gestational sac, but no yolk sac, fetal pole, or cardiac activity yet visualized. Recommend follow-up quantitative B-HCG levels and follow-up US in 14 days to confirm and assess viability. This recommendation follows SRU consensus guidelines: Diagnostic Criteria for Nonviable Pregnancy Early in the  First Trimester. Malva Limes Med 2013; 147:8295-62. Electronically Signed   By: Malachy Moan M.D.   On: 03/29/2015 22:38   US Ob Transvaginal  03/29/2015  CLINICAL DATA:  30 year old female reportedly 5 weeks 0 days pregnant with lower abdominal pain for 1 week. Quantitative beta HCG 2,711. EXAM: OBSTETRIC <14 WK [place] AND TRANSVAGINAL OB US TECHNIQUE: Both transabdominal and transvaginal ultrasound examinations were performed for complete evaluation of the gestation as well as the maternal uterus, adnexal regions, and pelvic cul-de-sac. Transvaginal technique was performed to assess early pregnancy. COMPARISON:  None. FINDINGS: Intrauterine gestational sac: Single small fluid collection in the subendometrial space consistent with an intrauterine gestational sac. Yolk sac:  Not visualized Embryo:  Not visualized MSD: 4.1  mm   5 w   1  d Subchorionic hemorrhage:  None visualized. Maternal uterus/adnexae: Normal appearance of the bilateral ovaries. No free fluid. IMPRESSION: Probable early intrauterine gestational sac, but no yolk sac, fetal pole, or cardiac activity yet visualized. Recommend follow-up quantitative B-HCG levels and follow-up US in 14 days to confirm and assess viability. This recommendation follows SRU consensus guidelines: Diagnostic Criteria for Nonviable Pregnancy Early in the First Trimester. Malva Limes Med 2013; 130:8657-84. Electronically Signed   By: Malachy Moan M.D.   On: 03/29/2015 22:38    Assessment & Plan:  Suspect early IUP- HCG rising appropriately and sono shows GS Given ectopic vs SAB precautions.  Pt would like to f/u for NOB F/u US in 2 weeks to confirm fetal pole  Samora Jernberg L. Harraway-Smith, M.D., [place]

## 2015-04-15 ENCOUNTER — Ambulatory Visit (HOSPITAL_COMMUNITY)
Admission: RE | Admit: 2015-04-15 | Discharge: 2015-04-15 | Disposition: A | Discharge status: Home / Self Care | Payer: Medicaid Other | Source: Ambulatory Visit | Attending: Obstetrics & Gynecology | Admitting: Obstetrics & Gynecology

## 2015-04-15 ENCOUNTER — Other Ambulatory Visit: Payer: Self-pay | Admitting: Obstetrics & Gynecology

## 2015-04-15 DIAGNOSIS — O3680X0 Pregnancy with inconclusive fetal viability, not applicable or unspecified: Secondary | ICD-10-CM

## 2015-04-15 DIAGNOSIS — O208 Other hemorrhage in early pregnancy: Secondary | ICD-10-CM | POA: Insufficient documentation

## 2015-04-15 DIAGNOSIS — Z3A01 Less than 8 weeks gestation of pregnancy: Secondary | ICD-10-CM | POA: Diagnosis not present

## 2015-04-15 DIAGNOSIS — Z36 Encounter for antenatal screening of mother: Secondary | ICD-10-CM | POA: Insufficient documentation

## 2015-04-29 ENCOUNTER — Encounter: Payer: Self-pay | Admitting: Family

## 2015-05-09 ENCOUNTER — Encounter: Payer: Self-pay | Admitting: Emergency Medicine

## 2015-05-09 ENCOUNTER — Emergency Department
Admission: EM | Admit: 2015-05-09 | Discharge: 2015-05-09 | Disposition: A | Discharge status: Home / Self Care | Payer: Medicaid Other | Attending: Emergency Medicine | Admitting: Emergency Medicine

## 2015-05-09 DIAGNOSIS — Z87891 Personal history of nicotine dependence: Secondary | ICD-10-CM | POA: Insufficient documentation

## 2015-05-09 DIAGNOSIS — O131 Gestational [pregnancy-induced] hypertension without significant proteinuria, first trimester: Secondary | ICD-10-CM

## 2015-05-09 DIAGNOSIS — O219 Vomiting of pregnancy, unspecified: Secondary | ICD-10-CM | POA: Diagnosis present

## 2015-05-09 DIAGNOSIS — Z3A11 11 weeks gestation of pregnancy: Secondary | ICD-10-CM | POA: Insufficient documentation

## 2015-05-09 DIAGNOSIS — R112 Nausea with vomiting, unspecified: Secondary | ICD-10-CM

## 2015-05-09 LAB — URINALYSIS COMPLETE WITH MICROSCOPIC (ARMC ONLY)
BACTERIA UA: NONE SEEN
Bilirubin Urine: NEGATIVE
GLUCOSE, UA: NEGATIVE mg/dL
HGB URINE DIPSTICK: NEGATIVE
NITRITE: NEGATIVE
Protein, ur: NEGATIVE mg/dL
SPECIFIC GRAVITY, URINE: 1.014 (ref 1.005–1.030)
pH: 6 (ref 5.0–8.0)

## 2015-05-09 LAB — COMPREHENSIVE METABOLIC PANEL
ALT: 15 U/L (ref 14–54)
ANION GAP: 9 (ref 5–15)
AST: 21 U/L (ref 15–41)
Albumin: 3.6 g/dL (ref 3.5–5.0)
Alkaline Phosphatase: 57 U/L (ref 38–126)
BUN: 8 mg/dL (ref 6–20)
CO2: 20 mmol/L — AB (ref 22–32)
Calcium: 8.7 mg/dL — ABNORMAL LOW (ref 8.9–10.3)
Chloride: 103 mmol/L (ref 101–111)
Creatinine, Ser: 0.48 mg/dL (ref 0.44–1.00)
GFR calc Af Amer: >60 mL/min (ref >60)
GFR calc non Af Amer: >60 mL/min (ref >60)
GLUCOSE: 83 mg/dL (ref 65–99)
POTASSIUM: 3.4 mmol/L — AB (ref 3.5–5.1)
Sodium: 132 mmol/L — ABNORMAL LOW (ref 135–145)
Total Bilirubin: 0.6 mg/dL (ref 0.3–1.2)
Total Protein: 7.2 g/dL (ref 6.5–8.1)

## 2015-05-09 LAB — CBC
HCT: 37.1 % (ref 35.0–47.0)
HEMOGLOBIN: 13 g/dL (ref 12.0–16.0)
MCH: 30.3 pg (ref 26.0–34.0)
MCHC: 35.2 g/dL (ref 32.0–36.0)
MCV: 86.3 fL (ref 80.0–100.0)
Platelets: 249 10*3/uL (ref 150–440)
RBC: 4.3 MIL/uL (ref 3.80–5.20)
RDW: 13.9 % (ref 11.5–14.5)
WBC: 11.3 10*3/uL — ABNORMAL HIGH (ref 3.6–11.0)

## 2015-05-09 LAB — LIPASE, BLOOD: Lipase: 18 U/L (ref 11–51)

## 2015-05-09 MED ORDER — ONDANSETRON HCL 4 MG/2ML IJ SOLN
4.0000 mg | Freq: Once | INTRAMUSCULAR | Status: AC
  Administered 2015-05-09: 4 mg via INTRAVENOUS
  Filled 2015-05-09: qty 2

## 2015-05-09 MED ORDER — ONDANSETRON 4 MG PO TBDP: 4.0000 mg | ORAL_TABLET | Freq: Three times a day (TID) | ORAL | Status: DC | PRN

## 2015-05-09 MED ORDER — SODIUM CHLORIDE 0.9 % IV BOLUS (SEPSIS)
1000.0000 mL | Freq: Once | INTRAVENOUS | Status: AC
  Administered 2015-05-09: 1000 mL via INTRAVENOUS

## 2015-05-09 NOTE — ED Provider Notes (Signed)
[facility] Emergency Department Provider Note  Time seen: 5:02 PM  I have reviewed the triage vital signs and the nursing notes.   HISTORY  Chief Complaint Chills    HPI Michele Gardner is a 30 y.o. female with a past medical history of asthma, Levaquin weeks pregnant presents to the emergency department nausea, vomiting. According to the patient she has been experiencing nausea every day of her pregnancy however today she has been vomiting unable to keep down liquids. A family member recently had nausea, vomiting, diarrhea, today she states she is vomiting more than normal. Denies any diarrhea. Denies abdominal pain, dysuria. Does state mild headache. Has not yet seen the OB/GYN. Her first appointment is in approximately one week.     Past Medical History  Diagnosis Date  . Medical history non-contributory   . Asthma     There are no active problems to display for this patient.   Past Surgical History  Procedure Laterality Date  . No past surgeries      Current Outpatient Rx  Name  Route  Sig  Dispense  Refill  . albuterol (PROVENTIL HFA;VENTOLIN HFA) 108 (90 BASE) MCG/ACT inhaler   Inhalation   Inhale 2 puffs into the lungs every 6 (six) hours as needed for wheezing.           Allergies Bee venom  Family History  Problem Relation Age of Onset  . Hypertension Mother   . Cancer Maternal Grandfather   . Cancer Paternal Grandmother   . Diabetes Paternal Grandfather     Social History Social History  Substance Use Topics  . Smoking status: Former Smoker -- 0.50 packs/day  . Smokeless tobacco: None  . Alcohol Use: No    Review of Systems Constitutional: Negative for fever. Cardiovascular: Negative for chest pain. Respiratory: Negative for shortness of breath. Gastrointestinal: Negative for abdominal pain. Positive for nausea or vomiting. Negative for diarrhea Genitourinary: Negative for dysuria. Neurological: Negative for  headache 10-point ROS otherwise negative.  ____________________________________________   PHYSICAL EXAM:  VITAL SIGNS: ED Triage Vitals  Enc Vitals Group     BP 05/09/15 1630 144/90 mmHg     Pulse Rate 05/09/15 1630 96     Resp 05/09/15 1630 18     Temp 05/09/15 1630 98.4 F (36.9 C)     Temp Source 05/09/15 1630 Oral     SpO2 05/09/15 1630 97 %     Weight 05/09/15 1630 160 lb (72.576 kg)     Height 05/09/15 1630 5\' 5"  (1.651 m)     Head Cir --      Peak Flow --      Pain Score 05/09/15 1635 5     Pain Loc --      Pain Edu? --      Excl. in GC? --     Constitutional: Alert and oriented. Well appearing and in no distress. Eyes: Normal exam ENT   Head: Normocephalic and atraumatic.   Mouth/Throat: Mucous membranes are moist. Cardiovascular: Normal rate, regular rhythm. No murmur Respiratory: Normal respiratory effort without tachypnea nor retractions. Breath sounds are clear Gastrointestinal: Soft and nontender. No distention. Musculoskeletal: Nontender with normal range of motion in all extremities. Neurologic:  Normal speech and language. No gross focal neurologic deficits Skin:  Skin is warm, dry and intact.  Psychiatric: Mood and affect are normal. Speech and behavior are normal. ____________________________________________    INITIAL IMPRESSION / ASSESSMENT AND PLAN / ED COURSE  Pertinent labs &  imaging results that were available during my care of the patient were reviewed by me and considered in my medical decision making (see chart for details).  Patient presents with nausea and vomiting. Patient is [redacted] weeks pregnant, has a history of nausea throughout her pregnancy but states her symptoms are worse today. Family member recently with nausea, vomiting, diarrhea. We will check labs, she with IV fluids and IV Zofran. I discussed with the patient at present consult Zofran as first cleft lip, cleft palate risk, patient is agreeable to proceed with  treatment.  Labs are within normal limits. We'll discharge the patient home, she states she is feeling much better. We will discharge with Zofran and OB follow-up on the third as scheduled.  ____________________________________________   FINAL CLINICAL IMPRESSION(S) / ED DIAGNOSES  Nausea and vomiting   Minna Antis, MD 05/09/15 [phone]

## 2015-05-09 NOTE — Discharge Instructions (Signed)

## 2015-05-09 NOTE — ED Notes (Signed)
C/o increased vomiting compared to her normal morning sickness.  Pt is [redacted] weeks pregnant.  C/o chills but has not taken temperature.  This feels different than from pregnancy.  Unable to keep food down but has kept some liquids down.  Has not seen OBGYN yet for this pregnancy, first appt is 05/17/15.  Denies vaginal bleeding.  Has had some stretching feelings in lower abdomen.

## 2015-05-09 NOTE — ED Notes (Signed)
[facility].  US showed IUP.

## 2015-05-10 DIAGNOSIS — O131 Gestational [pregnancy-induced] hypertension without significant proteinuria, first trimester: Secondary | ICD-10-CM

## 2015-05-17 ENCOUNTER — Other Ambulatory Visit (HOSPITAL_COMMUNITY)
Admission: RE | Admit: 2015-05-17 | Discharge: 2015-05-17 | Disposition: A | Discharge status: Home / Self Care | Payer: Medicaid Other | Source: Ambulatory Visit | Attending: Family Medicine | Admitting: Family Medicine

## 2015-05-17 ENCOUNTER — Ambulatory Visit (INDEPENDENT_AMBULATORY_CARE_PROVIDER_SITE_OTHER): Payer: Medicaid Other | Admitting: Family Medicine

## 2015-05-17 ENCOUNTER — Encounter: Payer: Self-pay | Admitting: Family Medicine

## 2015-05-17 VITALS — BP 135/83 | HR 72 | Temp 98.8°F | Wt 166.2 lb

## 2015-05-17 DIAGNOSIS — O161 Unspecified maternal hypertension, first trimester: Secondary | ICD-10-CM

## 2015-05-17 DIAGNOSIS — Z3480 Encounter for supervision of other normal pregnancy, unspecified trimester: Secondary | ICD-10-CM | POA: Insufficient documentation

## 2015-05-17 DIAGNOSIS — O131 Gestational [pregnancy-induced] hypertension without significant proteinuria, first trimester: Secondary | ICD-10-CM | POA: Diagnosis present

## 2015-05-17 DIAGNOSIS — Z113 Encounter for screening for infections with a predominantly sexual mode of transmission: Secondary | ICD-10-CM | POA: Diagnosis not present

## 2015-05-17 DIAGNOSIS — Z01419 Encounter for gynecological examination (general) (routine) without abnormal findings: Secondary | ICD-10-CM | POA: Diagnosis present

## 2015-05-17 DIAGNOSIS — Z124 Encounter for screening for malignant neoplasm of cervix: Secondary | ICD-10-CM | POA: Diagnosis not present

## 2015-05-17 DIAGNOSIS — O0991 Supervision of high risk pregnancy, unspecified, first trimester: Secondary | ICD-10-CM

## 2015-05-17 LAB — BASIC METABOLIC PANEL WITH GFR
BUN: 9 mg/dL (ref 7–25)
CO2: 23 mmol/L (ref 20–31)
Calcium: 8.7 mg/dL (ref 8.6–10.2)
Chloride: 104 mmol/L (ref 98–110)
Creat: 0.48 mg/dL — ABNORMAL LOW (ref 0.50–1.10)
Glucose, Bld: 68 mg/dL (ref 65–99)
Potassium: 4.1 mmol/L (ref 3.5–5.3)
Sodium: 137 mmol/L (ref 135–146)

## 2015-05-17 LAB — POCT URINALYSIS DIP (DEVICE)
Bilirubin Urine: NEGATIVE
Glucose, UA: NEGATIVE mg/dL
Hgb urine dipstick: NEGATIVE
Ketones, ur: NEGATIVE mg/dL
Nitrite: NEGATIVE
Protein, ur: 30 mg/dL — AB
Specific Gravity, Urine: >=1.03 (ref 1.005–1.030)
Urobilinogen, UA: 0.2 mg/dL (ref 0.0–1.0)
pH: 6 (ref 5.0–8.0)

## 2015-05-17 LAB — HEPATITIS B SURFACE ANTIGEN: Hepatitis B Surface Ag: NEGATIVE

## 2015-05-17 MED ORDER — ASPIRIN EC 81 MG PO TBEC: 81.0000 mg | DELAYED_RELEASE_TABLET | Freq: Every day | ORAL | Status: DC

## 2015-05-17 MED ORDER — PRENATAL 27-0.8 MG PO TABS: 1.0000 | ORAL_TABLET | Freq: Every day | ORAL | Status: DC

## 2015-05-17 NOTE — Patient Instructions (Signed)
First Trimester of Pregnancy The first trimester of pregnancy is from week 1 until the end of week 12 (months 1 through 3). A week after a sperm fertilizes an egg, the egg will implant on the wall of the uterus. This embryo will begin to develop into a baby. Genes from you and your partner are forming the baby. The female genes determine whether the baby is a boy or a girl. At 6-8 weeks, the eyes and face are formed, and the heartbeat can be seen on ultrasound. At the end of 12 weeks, all the baby's organs are formed.  Now that you are pregnant, you will want to do everything you can to have a healthy baby. Two of the most important things are to get good prenatal care and to follow your health care provider's instructions. Prenatal care is all the medical care you receive before the baby's birth. This care will help prevent, find, and treat any problems during the pregnancy and childbirth. BODY CHANGES Your body goes through many changes during pregnancy. The changes vary from woman to woman.   You may gain or lose a couple of pounds at first.  You may feel sick to your stomach (nauseous) and throw up (vomit). If the vomiting is uncontrollable, call your health care provider.  You may tire easily.  You may develop headaches that can be relieved by medicines approved by your health care provider.  You may urinate more often. Painful urination may mean you have a bladder infection.  You may develop heartburn as a result of your pregnancy.  You may develop constipation because certain hormones are causing the muscles that push waste through your intestines to slow down.  You may develop hemorrhoids or swollen, bulging veins (varicose veins).  Your breasts may begin to grow larger and become tender. Your nipples may stick out more, and the tissue that surrounds them (areola) may become darker.  Your gums may bleed and may be sensitive to brushing and flossing.  Dark spots or blotches (chloasma,  mask of pregnancy) may develop on your face. This will likely fade after the baby is born.  Your menstrual periods will stop.  You may have a loss of appetite.  You may develop cravings for certain kinds of food.  You may have changes in your emotions from day to day, such as being excited to be pregnant or being concerned that something may go wrong with the pregnancy and baby.  You may have more vivid and strange dreams.  You may have changes in your hair. These can include thickening of your hair, rapid growth, and changes in texture. Some women also have hair loss during or after pregnancy, or hair that feels dry or thin. Your hair will most likely return to normal after your baby is born. WHAT TO EXPECT AT YOUR PRENATAL VISITS During a routine prenatal visit:  You will be weighed to make sure you and the baby are growing normally.  Your blood pressure will be taken.  Your abdomen will be measured to track your baby's growth.  The fetal heartbeat will be listened to starting around week 10 or 12 of your pregnancy.  Test results from any previous visits will be discussed. Your health care provider may ask you:  How you are feeling.  If you are feeling the baby move.  If you have had any abnormal symptoms, such as leaking fluid, bleeding, severe headaches, or abdominal cramping.  If you are using any tobacco products,   including cigarettes, chewing tobacco, and electronic cigarettes.  If you have any questions. Other tests that may be performed during your first trimester include:  Blood tests to find your blood type and to check for the presence of any previous infections. They will also be used to check for low iron levels (anemia) and Rh antibodies. Later in the pregnancy, blood tests for diabetes will be done along with other tests if problems develop.  Urine tests to check for infections, diabetes, or protein in the urine.  An ultrasound to confirm the proper growth  and development of the baby.  An amniocentesis to check for possible genetic problems.  Fetal screens for spina bifida and Down syndrome.  You may need other tests to make sure you and the baby are doing well.  HIV (human immunodeficiency virus) testing. Routine prenatal testing includes screening for HIV, unless you choose not to have this test. HOME CARE INSTRUCTIONS  Medicines  Follow your health care provider's instructions regarding medicine use. Specific medicines may be either safe or unsafe to take during pregnancy.  Take your prenatal vitamins as directed.  If you develop constipation, try taking a stool softener if your health care provider approves. Diet  Eat regular, well-balanced meals. Choose a variety of foods, such as meat or vegetable-based protein, fish, milk and low-fat dairy products, vegetables, fruits, and whole grain breads and cereals. Your health care provider will help you determine the amount of weight gain that is right for you.  Avoid raw meat and uncooked cheese. These carry germs that can cause birth defects in the baby.  Eating four or five small meals rather than three large meals a day may help relieve nausea and vomiting. If you start to feel nauseous, eating a few soda crackers can be helpful. Drinking liquids between meals instead of during meals also seems to help nausea and vomiting.  If you develop constipation, eat more high-fiber foods, such as fresh vegetables or fruit and whole grains. Drink enough fluids to keep your urine clear or pale yellow. Activity and Exercise  Exercise only as directed by your health care provider. Exercising will help you:  Control your weight.  Stay in shape.  Be prepared for labor and delivery.  Experiencing pain or cramping in the lower abdomen or low back is a good sign that you should stop exercising. Check with your health care provider before continuing normal exercises.  Try to avoid standing for long  periods of time. Move your legs often if you must stand in one place for a long time.  Avoid heavy lifting.  Wear low-heeled shoes, and practice good posture.  You may continue to have sex unless your health care provider directs you otherwise. Relief of Pain or Discomfort  Wear a good support bra for breast tenderness.   Take warm sitz baths to soothe any pain or discomfort caused by hemorrhoids. Use hemorrhoid cream if your health care provider approves.   Rest with your legs elevated if you have leg cramps or low back pain.  If you develop varicose veins in your legs, wear support hose. Elevate your feet for 15 minutes, 3-4 times a day. Limit salt in your diet. Prenatal Care  Schedule your prenatal visits by the twelfth week of pregnancy. They are usually scheduled monthly at first, then more often in the last 2 months before delivery.  Write down your questions. Take them to your prenatal visits.  Keep all your prenatal visits as directed by your   health care provider. Safety  Wear your seat belt at all times when driving.  Make a list of emergency phone numbers, including numbers for family, friends, the hospital, and police and fire departments. General Tips  Ask your health care provider for a referral to a local prenatal education class. Begin classes no later than at the beginning of month 6 of your pregnancy.  Ask for help if you have counseling or nutritional needs during pregnancy. Your health care provider can offer advice or refer you to specialists for help with various needs.  Do not use hot tubs, steam rooms, or saunas.  Do not douche or use tampons or scented sanitary pads.  Do not cross your legs for long periods of time.  Avoid cat litter boxes and soil used by cats. These carry germs that can cause birth defects in the baby and possibly loss of the fetus by miscarriage or stillbirth.  Avoid all smoking, herbs, alcohol, and medicines not prescribed by  your health care provider. Chemicals in these affect the formation and growth of the baby.  Do not use any tobacco products, including cigarettes, chewing tobacco, and electronic cigarettes. If you need help quitting, ask your health care provider. You may receive counseling support and other resources to help you quit.  Schedule a dentist appointment. At home, brush your teeth with a soft toothbrush and be gentle when you floss. SEEK MEDICAL CARE IF:   You have dizziness.  You have mild pelvic cramps, pelvic pressure, or nagging pain in the abdominal area.  You have persistent nausea, vomiting, or diarrhea.  You have a bad smelling vaginal discharge.  You have pain with urination.  You notice increased swelling in your face, hands, legs, or ankles. SEEK IMMEDIATE MEDICAL CARE IF:   You have a fever.  You are leaking fluid from your vagina.  You have spotting or bleeding from your vagina.  You have severe abdominal cramping or pain.  You have rapid weight gain or loss.  You vomit blood or material that looks like coffee grounds.  You are exposed to German measles and have never had them.  You are exposed to fifth disease or chickenpox.  You develop a severe headache.  You have shortness of breath.  You have any kind of trauma, such as from a fall or a car accident.   This information is not intended to replace advice given to you by your health care provider. Make sure you discuss any questions you have with your health care provider.   Document Released: 01/24/2001 Document Revised: 02/20/2014 Document Reviewed: 12/10/2012 Elsevier Interactive Patient Education 2016 Elsevier Inc.  

## 2015-05-17 NOTE — Progress Notes (Signed)
Subjective:  Michele Gardner is a 30 y.o. G3P1011 at 5347w0d being seen today for ongoing prenatal care.  She is currently monitored for the following issues for this high-risk pregnancy and has Elevated blood pressure complicating pregnancy, antepartum and Supervision of high risk pregnancy, antepartum on her problem list.  Patient reports no complaints.  Contractions: Not present. Vag. Bleeding: None.   . Denies leaking of fluid.   The following portions of the patient's history were reviewed and updated as appropriate: allergies, current medications, past family history, past medical history, past social history, past surgical history and problem list. Problem list updated.  Objective:   Filed Vitals:   05/17/15 0835  BP: 135/83  Pulse: 72  Temp: 98.8 F (37.1 C)  Weight: 166 lb 3.2 oz (75.388 kg)    Fetal Status:           General:  Alert, oriented and cooperative. Patient is in no acute distress.  Skin: Skin is warm and dry. No rash noted.   Cardiovascular: Normal heart rate noted  Respiratory: Normal respiratory effort, no problems with respiration noted  Abdomen: Soft, gravid, appropriate for gestational age. Pain/Pressure: Present     Pelvic: Vag. Bleeding: None     Cervical exam deferred        Extremities: Normal range of motion.  Edema: None  Mental Status: Normal mood and affect. Normal behavior. Normal judgment and thought content.   Urinalysis: Urine Protein: 1+ Urine Glucose: Negative  Assessment and Plan:  Pregnancy: G3P1011 at 6447w0d  1. Elevated blood pressure complicating pregnancy, antepartum, first trimester - BMP, 24 hr urine protein - Start ASA  2. Supervision of high risk pregnancy, antepartum, first trimester - Performed BS US to establish baseline FHR (150). + Fetal movement visualized - [facility] Box - Rubella screen - Hepatitis B Surface AntiGEN - Type and screen; Future - Antibody screen; Future - GC/Chlamydia probe amp ([place] -  Cytology - PAP - Cystic fibrosis diagnostic study - Prescript Monitor Profile(19) - Culture, OB Urine - Ordered NT US - Ordered Anatomy US  Preterm labor symptoms and general obstetric precautions including but not limited to vaginal bleeding, contractions, leaking of fluid and fetal movement were reviewed in detail with the patient. Please refer to After Visit Summary for other counseling recommendations.  Return in about 4 weeks (around 06/14/2015) for Routine prenatal care.  Future Appointments Date Time Provider Department Center  05/20/2015 2:45 PM [facility]-M[facility] LAB [facility]-MFC MFC-US  05/20/2015 3:00 PM [facility]-M[facility] US 1 WH-US 203  06/07/2015 8:45 AM Kathrynn RunningNoah Bedford Wouk, MD WOC-WOCA WOC  06/10/2015 2:00 PM WH-MFC US 5 WH-US [address]    Ivin Rosenbloom Niles Ewel Lona, [place]

## 2015-05-17 NOTE — Progress Notes (Signed)
Urine: trace wbcs Initial prenatal education packet given Breastfeeding tip of the week reviewed

## 2015-05-18 ENCOUNTER — Encounter: Payer: Self-pay | Admitting: Family Medicine

## 2015-05-18 DIAGNOSIS — O9989 Other specified diseases and conditions complicating pregnancy, childbirth and the puerperium: Secondary | ICD-10-CM

## 2015-05-18 DIAGNOSIS — Z283 Underimmunization status: Secondary | ICD-10-CM | POA: Insufficient documentation

## 2015-05-18 DIAGNOSIS — Z2839 Other underimmunization status: Secondary | ICD-10-CM | POA: Insufficient documentation

## 2015-05-18 LAB — CULTURE, OB URINE: Colony Count: 7000

## 2015-05-18 LAB — GC/CHLAMYDIA PROBE AMP (~~LOC~~) NOT AT ARMC
CHLAMYDIA, DNA PROBE: NEGATIVE
Neisseria Gonorrhea: NEGATIVE

## 2015-05-18 LAB — RUBELLA SCREEN

## 2015-05-19 LAB — PRESCRIPTION MONITORING PROFILE (19 PANEL)
AMPHETAMINE/METH: NEGATIVE ng/mL
BENZODIAZEPINE SCREEN, URINE: NEGATIVE ng/mL
BUPRENORPHINE, URINE: NEGATIVE ng/mL
Barbiturate Screen, Urine: NEGATIVE ng/mL
CANNABINOID SCRN UR: NEGATIVE ng/mL
Carisoprodol, Urine: NEGATIVE ng/mL
Cocaine Metabolites: NEGATIVE ng/mL
Creatinine, Urine: 251.35 mg/dL (ref >20.0)
ECSTASY: NEGATIVE ng/mL
FENTANYL URINE: NEGATIVE ng/mL
MEPERIDINE UR: NEGATIVE ng/mL
METHADONE SCREEN, URINE: NEGATIVE ng/mL
METHAQUALONE SCREEN (URINE): NEGATIVE ng/mL
Nitrites, Initial: NEGATIVE ug/mL
Opiate Screen, Urine: NEGATIVE ng/mL
Oxycodone Screen, Ur: NEGATIVE ng/mL
PHENCYCLIDINE, UR: NEGATIVE ng/mL
Propoxyphene: NEGATIVE ng/mL
Tapentadol, urine: NEGATIVE ng/mL
Tramadol Scrn, Ur: NEGATIVE ng/mL
ZOLPIDEM, URINE: NEGATIVE ng/mL
pH, Initial: 6.4 pH (ref 4.5–8.9)

## 2015-05-19 LAB — CYTOLOGY - PAP

## 2015-05-20 ENCOUNTER — Other Ambulatory Visit (HOSPITAL_COMMUNITY): Payer: Medicaid Other

## 2015-05-20 ENCOUNTER — Ambulatory Visit (HOSPITAL_COMMUNITY): Admission: RE | Admit: 2015-05-20 | Payer: Medicaid Other | Source: Ambulatory Visit

## 2015-05-25 LAB — CYSTIC FIBROSIS DIAGNOSTIC STUDY

## 2015-05-31 ENCOUNTER — Encounter (HOSPITAL_COMMUNITY): Payer: Self-pay | Admitting: Family Medicine

## 2015-06-07 ENCOUNTER — Encounter: Payer: Medicaid Other | Admitting: Obstetrics and Gynecology

## 2015-06-07 ENCOUNTER — Telehealth: Payer: Self-pay | Admitting: *Deleted

## 2015-06-07 NOTE — Telephone Encounter (Signed)
Michele Gardner left a message this am that she needs to get her prenatal appointment changed to afternoon- but they said her chart said high risk and wouldn't let her. Per chart review appointments are for afternoon. [name] , she thanked me for the call, but states she had already called again and got it changed/

## 2015-06-10 ENCOUNTER — Ambulatory Visit (HOSPITAL_COMMUNITY): Payer: Medicaid Other

## 2015-06-23 ENCOUNTER — Ambulatory Visit (INDEPENDENT_AMBULATORY_CARE_PROVIDER_SITE_OTHER): Payer: Medicaid Other | Admitting: Obstetrics & Gynecology

## 2015-06-23 VITALS — BP 119/61 | HR 68 | Wt 172.3 lb

## 2015-06-23 DIAGNOSIS — O132 Gestational [pregnancy-induced] hypertension without significant proteinuria, second trimester: Secondary | ICD-10-CM

## 2015-06-23 DIAGNOSIS — O0992 Supervision of high risk pregnancy, unspecified, second trimester: Secondary | ICD-10-CM

## 2015-06-23 LAB — POCT URINALYSIS DIP (DEVICE)
BILIRUBIN URINE: NEGATIVE
GLUCOSE, UA: NEGATIVE mg/dL
HGB URINE DIPSTICK: NEGATIVE
Ketones, ur: NEGATIVE mg/dL
NITRITE: NEGATIVE
Protein, ur: NEGATIVE mg/dL
Specific Gravity, Urine: 1.025 (ref 1.005–1.030)
Urobilinogen, UA: 0.2 mg/dL (ref 0.0–1.0)
pH: 6 (ref 5.0–8.0)

## 2015-06-23 NOTE — Progress Notes (Signed)
Subjective:  Michele Gardner is a 30 y.o. G3P1011 at 3941w2d being seen today for ongoing prenatal care.  She is currently monitored for the following issues for this low-risk pregnancy and has Elevated blood pressure complicating pregnancy, antepartum; Supervision of high risk pregnancy, antepartum; and Rubella non-immune status, antepartum on her problem list.  Patient reports headache, nausea and vomiting.  Contractions: Not present. Vag. Bleeding: None.  Movement: Present. Denies leaking of fluid.   The following portions of the patient's history were reviewed and updated as appropriate: allergies, current medications, past family history, past medical history, past social history, past surgical history and problem list. Problem list updated.  Objective:   Filed Vitals:   06/23/15 1605  BP: 119/61  Pulse: 68  Weight: 172 lb 4.8 oz (78.155 kg)    Fetal Status: Fetal Heart Rate (bpm): 151   Movement: Present     General:  Alert, oriented and cooperative. Patient is in no acute distress.  Skin: Skin is warm and dry. No rash noted.   Cardiovascular: Normal heart rate noted  Respiratory: Normal respiratory effort, no problems with respiration noted  Abdomen: Soft, gravid, appropriate for gestational age. Pain/Pressure: Absent     Pelvic: Vag. Bleeding: None     Cervical exam deferred        Extremities: Normal range of motion.  Edema: None  Mental Status: Normal mood and affect. Normal behavior. Normal judgment and thought content.   Urinalysis: Urine Protein: Negative Urine Glucose: Negative  Assessment and Plan:  Pregnancy: G3P1011 at 2641w2d  Patient reports uneventful course of pregnancy thus far. She reports that her morning sickness has subsided over the past few weeks. She complains of a 5 year history of migraines but has not yet seen a neurologist; she occasionally takes tylenol or excedrin for symptomatic relief. We will make a referral for her to see Nada MaclachlanKaren Teague-Clark.    There are no diagnoses linked to this encounter. Preterm labor symptoms and general obstetric precautions including but not limited to vaginal bleeding, contractions, leaking of fluid and fetal movement were reviewed in detail with the patient. Please refer to After Visit Summary for other counseling recommendations.  No Follow-up on file.   Hessie Knowshomas C Chasyn Cinque, Med Student

## 2015-06-23 NOTE — Patient Instructions (Signed)

## 2015-06-23 NOTE — Progress Notes (Signed)
Pt states she can only have prenatal appts in the afternoon. Pt states she is not taking ASA 81 mg daily because "she does not take medicine that she doesn't need."

## 2015-07-06 ENCOUNTER — Other Ambulatory Visit: Payer: Self-pay | Admitting: Family Medicine

## 2015-07-06 ENCOUNTER — Ambulatory Visit (HOSPITAL_COMMUNITY)
Admission: RE | Admit: 2015-07-06 | Discharge: 2015-07-06 | Disposition: A | Discharge status: Home / Self Care | Payer: Medicaid Other | Source: Ambulatory Visit | Attending: Family Medicine | Admitting: Family Medicine

## 2015-07-06 DIAGNOSIS — O0991 Supervision of high risk pregnancy, unspecified, first trimester: Secondary | ICD-10-CM

## 2015-07-06 DIAGNOSIS — Z3689 Encounter for other specified antenatal screening: Secondary | ICD-10-CM

## 2015-07-06 DIAGNOSIS — Z3A19 19 weeks gestation of pregnancy: Secondary | ICD-10-CM

## 2015-07-06 DIAGNOSIS — Z36 Encounter for antenatal screening of mother: Secondary | ICD-10-CM | POA: Insufficient documentation

## 2015-07-21 ENCOUNTER — Ambulatory Visit (INDEPENDENT_AMBULATORY_CARE_PROVIDER_SITE_OTHER): Payer: Medicaid Other | Admitting: Advanced Practice Midwife

## 2015-07-21 VITALS — BP 127/80 | HR 93 | Wt 174.1 lb

## 2015-07-21 DIAGNOSIS — Z36 Encounter for antenatal screening of mother: Secondary | ICD-10-CM | POA: Diagnosis not present

## 2015-07-21 DIAGNOSIS — O26892 Other specified pregnancy related conditions, second trimester: Secondary | ICD-10-CM

## 2015-07-21 DIAGNOSIS — O132 Gestational [pregnancy-induced] hypertension without significant proteinuria, second trimester: Secondary | ICD-10-CM

## 2015-07-21 DIAGNOSIS — Z3482 Encounter for supervision of other normal pregnancy, second trimester: Secondary | ICD-10-CM

## 2015-07-21 DIAGNOSIS — O131 Gestational [pregnancy-induced] hypertension without significant proteinuria, first trimester: Secondary | ICD-10-CM

## 2015-07-21 DIAGNOSIS — R12 Heartburn: Secondary | ICD-10-CM | POA: Diagnosis not present

## 2015-07-21 DIAGNOSIS — IMO0002 Reserved for concepts with insufficient information to code with codable children: Secondary | ICD-10-CM

## 2015-07-21 DIAGNOSIS — Z0489 Encounter for examination and observation for other specified reasons: Secondary | ICD-10-CM

## 2015-07-21 LAB — POCT URINALYSIS DIP (DEVICE)
Glucose, UA: 100 mg/dL — AB
HGB URINE DIPSTICK: NEGATIVE
NITRITE: NEGATIVE
PH: 7 (ref 5.0–8.0)
PROTEIN: 100 mg/dL — AB
Specific Gravity, Urine: 1.015 (ref 1.005–1.030)
Urobilinogen, UA: 1 mg/dL (ref 0.0–1.0)

## 2015-07-21 MED ORDER — RANITIDINE HCL 150 MG PO TABS: 150.0000 mg | ORAL_TABLET | Freq: Two times a day (BID) | ORAL | Status: DC

## 2015-07-21 NOTE — Patient Instructions (Signed)
Thinking About Doren Custard???  You must attend a Doren Custard class at [facility]  3rd Wednesday of every month from 7-9pm  Free  [employer] by calling [phone] or online at [email or web address].[email or web address]  Bring [place] the certificate from the class  Waterbirth supplies needed for [facility] patients:  Our practice has a Heritage manager in a Box tub at the hospital that you can borrow  You will need to purchase an accessory kit that has all needed supplies through [facility] [phone]) or online $175.00  Or you can purchase the supplies separately: o Single-use disposable tub liner for Birth Pool in a Box (REGULAR size) o New garden hose labeled "lead-free", "suitable for drinking water", o Electric drain pump to remove water (We recommend 792 gallon per hour or greater pump.)  o  "non-toxic" OR "water potable" o Garden hose to remove the dirty water o Fish net o Bathing suit top (optional) o Long-handled mirror (optional)  [employer] sells tubs for ~ $120 if you would rather purchase your own tub.  They also sell accessories, liners.    Www.waterbirthsolutions.com for tub purchases and supplies  The Labor Ladies (www.thelaborladies.com) $275 for tub rental/set-up & take down/kit   [employer] information regarding doulas (labor support) who provide pool rentals:  IdentityList.se.htm   The Labor Ladies (www.thelaborladies.com)  IdentityList.se.htm   Things that would prevent you from having a waterbirth:  Premature, <37wks  Previous cesarean birth  Presence of thick meconium-stained fluid  Multiple gestation (Twins, triplets, etc.)  Uncontrolled diabetes or gestational diabetes requiring medication  Hypertension  Heavy vaginal bleeding  Non-reassuring fetal heart rate  Active infection (MRSA, etc.)  If your labor has to be induced and induction  method requires continuous monitoring of the baby's heart rate  Other risks/issues identified by your obstetrical provider

## 2015-07-21 NOTE — Progress Notes (Signed)
Subjective:  Michele Gardner is a 30 y.o. G3P1011 at 8356w2d being seen today for ongoing prenatal care.  She is currently monitored for the following issues for this low-risk pregnancy and has Transient hypertension of pregnancy in first trimester; Supervision of normal subsequent pregnancy; and Rubella non-immune status, antepartum on her problem list.  Patient reports no complaints.   .  .  Movement: Present. Denies leaking of fluid.   The following portions of the patient's history were reviewed and updated as appropriate: allergies, current medications, past family history, past medical history, past social history, past surgical history and problem list. Problem list updated.  Objective:   Filed Vitals:   07/21/15 1117  BP: 127/80  Pulse: 93  Weight: 174 lb 1.6 oz (78.971 kg)    Fetal Status: Fetal Heart Rate (bpm): 155   Movement: Present     General:  Alert, oriented and cooperative. Patient is in no acute distress.  Skin: Skin is warm and dry. No rash noted.   Cardiovascular: Normal heart rate noted  Respiratory: Normal respiratory effort, no problems with respiration noted  Abdomen: Soft, gravid, appropriate for gestational age. Pain/Pressure: Present     Pelvic:       Cervical exam deferred        Extremities: Normal range of motion.     Mental Status: Normal mood and affect. Normal behavior. Normal judgment and thought content.   Urinalysis: Urine Protein: 1+ Urine Glucose: 1+  Assessment and Plan:  Pregnancy: G3P1011 at 6556w2d  1. Transient hypertension of pregnancy in first trimester --Pt had one high blood pressure at an ED visit at 11 weeks of pregnancy. She reports significant personal stress on the day of that visit and denies any other history of HTN prior to or during pregnancy.    2. Encounter for supervision of other normal pregnancy in second trimester - Removed high risk pregnancy status.  Single elevated BP is not an indication for high risk pregnancy.  No  increased monitoring/labwork needed.  Will continue to monitor BP, and if elevated will change pt status and do testing.  Pt aware that status can change but will remain low risk for now.   - ABO AND RH  - Antibody screen  3. Evaluate anatomy not seen on prior sonogram  - US MFM OB FOLLOW UP; Future  4. Heartburn during pregnancy in second trimester, antepartum --Zantac 150 mg BID PRN  Preterm labor symptoms and general obstetric precautions including but not limited to vaginal bleeding, contractions, leaking of fluid and fetal movement were reviewed in detail with the patient. Please refer to After Visit Summary for other counseling recommendations.  Return in about 4 days (around 07/25/2015).   Hurshel PartyLisa A Leftwich-Kirby, CNM

## 2015-07-22 LAB — ABO AND RH: Rh Type: POSITIVE

## 2015-07-22 LAB — ANTIBODY SCREEN: Antibody Screen: NEGATIVE

## 2015-07-26 ENCOUNTER — Ambulatory Visit (HOSPITAL_COMMUNITY): Payer: Medicaid Other

## 2015-07-27 ENCOUNTER — Ambulatory Visit (HOSPITAL_COMMUNITY): Payer: Medicaid Other

## 2015-07-28 ENCOUNTER — Ambulatory Visit (HOSPITAL_COMMUNITY)
Admission: RE | Admit: 2015-07-28 | Discharge: 2015-07-28 | Disposition: A | Discharge status: Home / Self Care | Payer: Medicaid Other | Source: Ambulatory Visit | Attending: Advanced Practice Midwife | Admitting: Advanced Practice Midwife

## 2015-07-28 DIAGNOSIS — Z0489 Encounter for examination and observation for other specified reasons: Secondary | ICD-10-CM

## 2015-07-28 DIAGNOSIS — Z36 Encounter for antenatal screening of mother: Secondary | ICD-10-CM | POA: Insufficient documentation

## 2015-07-28 DIAGNOSIS — Z3A22 22 weeks gestation of pregnancy: Secondary | ICD-10-CM | POA: Diagnosis not present

## 2015-07-28 DIAGNOSIS — IMO0002 Reserved for concepts with insufficient information to code with codable children: Secondary | ICD-10-CM

## 2015-08-19 ENCOUNTER — Ambulatory Visit (INDEPENDENT_AMBULATORY_CARE_PROVIDER_SITE_OTHER): Payer: Medicaid Other | Admitting: Family

## 2015-08-19 VITALS — BP 131/73 | HR 75 | Wt 182.0 lb

## 2015-08-19 DIAGNOSIS — O131 Gestational [pregnancy-induced] hypertension without significant proteinuria, first trimester: Secondary | ICD-10-CM

## 2015-08-19 DIAGNOSIS — O132 Gestational [pregnancy-induced] hypertension without significant proteinuria, second trimester: Secondary | ICD-10-CM | POA: Diagnosis not present

## 2015-08-19 DIAGNOSIS — Z3482 Encounter for supervision of other normal pregnancy, second trimester: Secondary | ICD-10-CM

## 2015-08-19 LAB — POCT URINALYSIS DIP (DEVICE)
Bilirubin Urine: NEGATIVE
Glucose, UA: NEGATIVE mg/dL
HGB URINE DIPSTICK: NEGATIVE
Ketones, ur: NEGATIVE mg/dL
Nitrite: NEGATIVE
PH: 6.5 (ref 5.0–8.0)
Protein, ur: 30 mg/dL — AB
SPECIFIC GRAVITY, URINE: 1.02 (ref 1.005–1.030)
UROBILINOGEN UA: 0.2 mg/dL (ref 0.0–1.0)

## 2015-08-19 NOTE — Progress Notes (Signed)
Educated pt on Benefits of Breastfeeding for Baby 

## 2015-08-19 NOTE — Patient Instructions (Signed)
AREA PEDIATRIC/FAMILY PRACTICE PHYSICIANS  [facility] [address] [place], [place] [address] Phone - [phone]   Fax - [phone]  [facility] [address] [place], [place]  [address] Phone - [phone]   Fax - [phone]  [facility] [address] [place], [place]  [address] Phone - [phone]   Fax - [phone]  [facility] [address] [place], [place]  [address] Phone - [phone]   Fax - [phone]  [facility] [address] [place], [place]  [address] Phone - [phone]   Fax - [phone]  [facility] [address] [place], [place]  [address] Phone - [phone]   Fax - [phone]  [facility] [address] [place], [place]  [address] Phone - [phone]   Fax - [phone]  [facility] [address] [place], [place]  [address] Phone - [phone]   Fax - [phone]  [facility] [address] [place], [place]  [address] Phone - [phone]   Fax - [phone] [facility] [address] [place], [place]  [address] Phone - [phone]   Fax - [phone]  [facility] [address] [place], [place]  [address] Phone - [phone]   Fax - [phone]  [facility] [address] [place], [place]  [address] Phone - [phone]   Fax - [phone]  [facility] [address] [place], [place]  [address] Phone - [phone]   Fax - [phone]  [facility] GOSRANI [address] [place], [place]  [address] Phone - [phone]  [facility] [address] [place], [place]  [address] Phone - [phone]   Fax - [phone]  [facility] [address] [place], [place]  [address] Phone - [phone]   Fax - [phone]  [facility] [address] [place], [place]  [address] Phone - [phone]   Fax - [phone]  [facility] [address] [place], [place]  [address] Phone - [phone]   Fax - [phone]   [facility] [address] [place], [place]  [address] Phone - [phone]   Fax - [phone]  [facility] [address] [place], [place]  [address] Phone - [phone]   Fax - [phone]  [facility] RUB[facility] [address] [place], [place]  [address] Phone - [phone]   Fax - [phone]  [facility] [address] [place], [place]  [address] Phone - [phone]   Fax - [phone]  [facility] [address] [place], [place]  [address] Phone - [phone]   Fax - [phone] [facility] [address] [place], [place]  [address] Phone - [phone]   Fax - [phone]  [facility] [address] [place], [place]  [address] Phone - [phone]   Fax - [phone]  [facility] [address] [place],   [address] Phone - [phone]   Fax - [phone]   

## 2015-08-19 NOTE — Progress Notes (Signed)
  Subjective:  Michele Gardner is a 30 y.o. G3P1011 at 6461w3d being seen today for ongoing prenatal care.  She is currently monitored for the following issues for this high-risk pregnancy and has Transient hypertension of pregnancy in first trimester; Supervision of normal subsequent pregnancy; and Rubella non-immune status, antepartum on her problem list.  Patient reports no complaints.  Contractions: Not present. Vag. Bleeding: None.  Movement: Present. Denies leaking of fluid.   The following portions of the patient's history were reviewed and updated as appropriate: allergies, current medications, past family history, past medical history, past social history, past surgical history and problem list. Problem list updated.  Objective:   Filed Vitals:   08/19/15 0943  BP: 131/73  Pulse: 75  Weight: 182 lb (82.555 kg)    Fetal Status: Fetal Heart Rate (bpm): 156 Fundal Height: 26 cm Movement: Present     General:  Alert, oriented and cooperative. Patient is in no acute distress.  Skin: Skin is warm and dry. No rash noted.   Cardiovascular: Normal heart rate noted  Respiratory: Normal respiratory effort, no problems with respiration noted  Abdomen: Soft, gravid, appropriate for gestational age. Pain/Pressure: Present     Pelvic:  Cervical exam deferred        Extremities: Normal range of motion.  Edema: None  Mental Status: Normal mood and affect. Normal behavior. Normal judgment and thought content.   Urinalysis: Urine Protein: 1+ Urine Glucose: Negative  Assessment and Plan:  Pregnancy: G3P1011 at 2661w3d  1. Encounter for supervision of other normal pregnancy in second trimester - Discussed third trimester labs for next visit  2. Transient hypertension of pregnancy in first trimester - Normotensive this visit; continue monitoring  Preterm labor symptoms and general obstetric precautions including but not limited to vaginal bleeding, contractions, leaking of fluid and fetal  movement were reviewed in detail with the patient. Please refer to After Visit Summary for other counseling recommendations.  Return in about 3 weeks (around 09/09/2015).   Eino FarberWalidah Kennith GainN Karim, CNM

## 2015-08-19 NOTE — Progress Notes (Signed)
Home Medicaid Form Complete

## 2015-09-09 ENCOUNTER — Ambulatory Visit (INDEPENDENT_AMBULATORY_CARE_PROVIDER_SITE_OTHER): Payer: Medicaid Other | Admitting: Family

## 2015-09-09 VITALS — BP 131/78 | HR 88 | Wt 187.4 lb

## 2015-09-09 DIAGNOSIS — O131 Gestational [pregnancy-induced] hypertension without significant proteinuria, first trimester: Secondary | ICD-10-CM

## 2015-09-09 DIAGNOSIS — Z3483 Encounter for supervision of other normal pregnancy, third trimester: Secondary | ICD-10-CM | POA: Diagnosis not present

## 2015-09-09 DIAGNOSIS — Z23 Encounter for immunization: Secondary | ICD-10-CM

## 2015-09-09 LAB — CBC
HEMATOCRIT: 29.7 % — AB (ref 35.0–45.0)
Hemoglobin: 9.9 g/dL — ABNORMAL LOW (ref 11.7–15.5)
MCH: 28.9 pg (ref 27.0–33.0)
MCHC: 33.3 g/dL (ref 32.0–36.0)
MCV: 86.6 fL (ref 80.0–100.0)
MPV: 10 fL (ref 7.5–12.5)
PLATELETS: 250 10*3/uL (ref 140–400)
RBC: 3.43 MIL/uL — ABNORMAL LOW (ref 3.80–5.10)
RDW: 13.9 % (ref 11.0–15.0)
WBC: 9.1 10*3/uL (ref 3.8–10.8)

## 2015-09-09 LAB — POCT URINALYSIS DIP (DEVICE)
Bilirubin Urine: NEGATIVE
Glucose, UA: NEGATIVE mg/dL
Hgb urine dipstick: NEGATIVE
KETONES UR: NEGATIVE mg/dL
Nitrite: NEGATIVE
PROTEIN: 30 mg/dL — AB
SPECIFIC GRAVITY, URINE: 1.025 (ref 1.005–1.030)
UROBILINOGEN UA: 0.2 mg/dL (ref 0.0–1.0)
pH: 6.5 (ref 5.0–8.0)

## 2015-09-09 MED ORDER — RANITIDINE HCL 150 MG PO TABS: 150.0000 mg | ORAL_TABLET | Freq: Two times a day (BID) | ORAL | 2 refills | Status: DC

## 2015-09-09 NOTE — Progress Notes (Signed)
1 HOUR GTT DUE AT

## 2015-09-09 NOTE — Addendum Note (Signed)
Addended by: Cheree Ditto, Geovonni Meyerhoff A on: 09/09/2015 11:54 AM   Modules accepted: Orders

## 2015-09-09 NOTE — Patient Instructions (Signed)

## 2015-09-09 NOTE — Progress Notes (Signed)
Subjective:  Michele Gardner is a 30 y.o. G3P1011 at [redacted]w[redacted]d being seen today for ongoing prenatal care.  She is currently monitored for the following issues for this low-risk pregnancy and has Transient hypertension of pregnancy in first trimester; Supervision of normal subsequent pregnancy; and Rubella non-immune status, antepartum on her problem list.  Patient reports no complaints.   . Vag. Bleeding: None.  Movement: Present. Denies leaking of fluid.   The following portions of the patient's history were reviewed and updated as appropriate: allergies, current medications, past family history, past medical history, past social history, past surgical history and problem list. Problem list updated.  Objective:   Vitals:   09/09/15 0819  BP: 131/78  Pulse: 88  Weight: 187 lb 6.4 oz (85 kg)    Fetal Status: Fetal Heart Rate (bpm): 166 Fundal Height: 29 cm Movement: Present     General:  Alert, oriented and cooperative. Patient is in no acute distress.  Skin: Skin is warm and dry. No rash noted.   Cardiovascular: Normal heart rate noted  Respiratory: Normal respiratory effort, no problems with respiration noted  Abdomen: Soft, gravid, appropriate for gestational age. Pain/Pressure: Present     Pelvic:  Cervical exam deferred        Extremities: Normal range of motion.  Edema: None  Mental Status: Normal mood and affect. Normal behavior. Normal judgment and thought content.   Urinalysis:    Glucose neg  Protein 1+  Assessment and Plan:  Pregnancy: G3P1011 at [redacted]w[redacted]d  1. Encounter for supervision of other normal pregnancy in third trimester - Third trimester labs today  2. Transient hypertension of pregnancy in first trimester - Normotensive today  Preterm labor symptoms and general obstetric precautions including but not limited to vaginal bleeding, contractions, leaking of fluid and fetal movement were reviewed in detail with the patient. Please refer to After Visit Summary for other  counseling recommendations.  Return in 2 weeks (on 09/23/2015).   Eino Farber Kennith Gain, CNM

## 2015-09-10 ENCOUNTER — Other Ambulatory Visit: Payer: Self-pay | Admitting: Family

## 2015-09-10 ENCOUNTER — Encounter: Payer: Self-pay | Admitting: Family

## 2015-09-10 DIAGNOSIS — O99019 Anemia complicating pregnancy, unspecified trimester: Secondary | ICD-10-CM | POA: Insufficient documentation

## 2015-09-10 LAB — HIV ANTIBODY (ROUTINE TESTING W REFLEX): HIV 1&2 Ab, 4th Generation: NONREACTIVE

## 2015-09-10 LAB — RPR

## 2015-09-10 LAB — GLUCOSE TOLERANCE, 1 HOUR (50G) W/O FASTING: Glucose, 1 Hr, gestational: 115 mg/dL (ref <140)

## 2015-09-10 MED ORDER — FERROUS SULFATE 325 (65 FE) MG PO TABS: 325.0000 mg | ORAL_TABLET | Freq: Two times a day (BID) | ORAL | 2 refills | Status: DC

## 2015-09-14 ENCOUNTER — Telehealth: Payer: Self-pay | Admitting: General Practice

## 2015-09-14 NOTE — Telephone Encounter (Signed)
Called patient and informed her of CBC results indicating anemia. Also informed patient of Rx sent to pharmacy for iron BID & she may take docusate sodium as needed for constipation. Patient verbalized understanding to all & had no questions

## 2015-09-27 ENCOUNTER — Ambulatory Visit (INDEPENDENT_AMBULATORY_CARE_PROVIDER_SITE_OTHER): Payer: Medicaid Other | Admitting: Family Medicine

## 2015-09-27 VITALS — BP 130/72 | HR 101 | Wt 189.3 lb

## 2015-09-27 DIAGNOSIS — Z283 Underimmunization status: Secondary | ICD-10-CM

## 2015-09-27 DIAGNOSIS — Z3483 Encounter for supervision of other normal pregnancy, third trimester: Secondary | ICD-10-CM

## 2015-09-27 DIAGNOSIS — O99013 Anemia complicating pregnancy, third trimester: Secondary | ICD-10-CM

## 2015-09-27 DIAGNOSIS — Z2839 Other underimmunization status: Secondary | ICD-10-CM

## 2015-09-27 DIAGNOSIS — D649 Anemia, unspecified: Secondary | ICD-10-CM

## 2015-09-27 DIAGNOSIS — O9989 Other specified diseases and conditions complicating pregnancy, childbirth and the puerperium: Secondary | ICD-10-CM

## 2015-09-27 LAB — POCT URINALYSIS DIP (DEVICE)
Bilirubin Urine: NEGATIVE
Glucose, UA: NEGATIVE mg/dL
HGB URINE DIPSTICK: NEGATIVE
KETONES UR: NEGATIVE mg/dL
Nitrite: NEGATIVE
PH: 7 (ref 5.0–8.0)
PROTEIN: 30 mg/dL — AB
SPECIFIC GRAVITY, URINE: 1.02 (ref 1.005–1.030)
Urobilinogen, UA: 0.2 mg/dL (ref 0.0–1.0)

## 2015-09-27 NOTE — Progress Notes (Signed)
Subjective:  Michele Gardner is a 30 y.o. G3P1011 at 31w0dbeing seen today for ongoing prenatal care.  She is currently monitored for the following issues for this low-risk pregnancy and has Transient hypertension of pregnancy in first trimester; Supervision of normal subsequent pregnancy; Rubella non-immune status, antepartum; and Anemia of mother in pregnancy, antepartum on her problem list.  Patient reports occasional contractions.  Contractions: Not present. Vag. Bleeding: None.  Movement: Present. Denies leaking of fluid.   The following portions of the patient's history were reviewed and updated as appropriate: allergies, current medications, past family history, past medical history, past social history, past surgical history and problem list. Problem list updated.  Objective:   Vitals:   09/27/15 1347  BP: 130/72  Pulse: (!) 101  Weight: 189 lb 4.8 oz (85.9 kg)    Fetal Status: Fetal Heart Rate (bpm): 141   Movement: Present     General:  Alert, oriented and cooperative. Patient is in no acute distress.  Skin: Skin is warm and dry. No rash noted.   Cardiovascular: Normal heart rate noted  Respiratory: Normal respiratory effort, no problems with respiration noted  Abdomen: Soft, gravid, appropriate for gestational age. Pain/Pressure: Present     Pelvic:  Cervical exam deferred        Extremities: Normal range of motion.  Edema: None  Mental Status: Normal mood and affect. Normal behavior. Normal judgment and thought content.   Urinalysis:      Assessment and Plan:  Pregnancy: G3P1011 at 367w0d1. Encounter for supervision of other normal pregnancy in third trimester - Normotensive - Labs/ultrasounds UTD - Reviewed pediatricians  2. Rubella non-immune status, antepartum - MMR vaccine after delivery  3. Anemia of mother in pregnancy, antepartum, third trimester - Monitor  Preterm labor symptoms and general obstetric precautions including but not limited to vaginal  bleeding, contractions, leaking of fluid and fetal movement were reviewed in detail with the patient. Please refer to After Visit Summary for other counseling recommendations.  No Follow-up on file.   [place]

## 2015-09-27 NOTE — Patient Instructions (Signed)

## 2015-10-14 ENCOUNTER — Ambulatory Visit (INDEPENDENT_AMBULATORY_CARE_PROVIDER_SITE_OTHER): Payer: Medicaid Other | Admitting: Family

## 2015-10-14 VITALS — BP 128/72 | HR 76 | Wt 189.6 lb

## 2015-10-14 DIAGNOSIS — O09893 Supervision of other high risk pregnancies, third trimester: Secondary | ICD-10-CM

## 2015-10-14 DIAGNOSIS — O99013 Anemia complicating pregnancy, third trimester: Secondary | ICD-10-CM

## 2015-10-14 DIAGNOSIS — Z3483 Encounter for supervision of other normal pregnancy, third trimester: Secondary | ICD-10-CM

## 2015-10-14 DIAGNOSIS — D649 Anemia, unspecified: Secondary | ICD-10-CM

## 2015-10-14 LAB — POCT URINALYSIS DIP (DEVICE)
GLUCOSE, UA: NEGATIVE mg/dL
Hgb urine dipstick: NEGATIVE
KETONES UR: NEGATIVE mg/dL
Nitrite: NEGATIVE
Protein, ur: 30 mg/dL — AB
SPECIFIC GRAVITY, URINE: 1.025 (ref 1.005–1.030)
Urobilinogen, UA: 0.2 mg/dL (ref 0.0–1.0)
pH: 7 (ref 5.0–8.0)

## 2015-10-14 NOTE — Patient Instructions (Signed)

## 2015-10-14 NOTE — Progress Notes (Signed)
   PRENATAL VISIT NOTE  Subjective:  Sabas SousBlair F Iwan is a 30 y.o. G3P1011 at [redacted]w[redacted]d being seen today for ongoing prenatal care.  She is currently monitored for the following issues for this low-risk pregnancy and has Transient hypertension of pregnancy in first trimester; Supervision of normal subsequent pregnancy; Rubella non-immune status, antepartum; and Anemia of mother in pregnancy, antepartum on her problem list.  Patient reports occasional contractions.  Contractions: Not present. Vag. Bleeding: None.  Movement: Present. Denies leaking of fluid.   The following portions of the patient's history were reviewed and updated as appropriate: allergies, current medications, past family history, past medical history, past social history, past surgical history and problem list. Problem list updated.  Objective:   Vitals:   10/14/15 1009  BP: 128/72  Pulse: 76  Weight: 189 lb 9.6 oz (86 kg)    Fetal Status: Fetal Heart Rate (bpm): 144 Fundal Height: 34 cm Movement: Present     General:  Alert, oriented and cooperative. Patient is in no acute distress.  Skin: Skin is warm and dry. No rash noted.   Cardiovascular: Normal heart rate noted  Respiratory: Normal respiratory effort, no problems with respiration noted  Abdomen: Soft, gravid, appropriate for gestational age. Pain/Pressure: Present     Pelvic:  Cervical exam deferred        Extremities: Normal range of motion.  Edema: None  Mental Status: Normal mood and affect. Normal behavior. Normal judgment and thought content.   Urinalysis: Urine Protein: 1+ Urine Glucose: Negative  Assessment and Plan:  Pregnancy: G3P1011 at [redacted]w[redacted]d  1. Encounter for supervision of other normal pregnancy in third trimester - Reviewed third trimester labs  2.  Anemia of mother in pregnancy, antepartum, third trimester - Continue iron  Preterm labor symptoms and general obstetric precautions including but not limited to vaginal bleeding, contractions,  leaking of fluid and fetal movement were reviewed in detail with the patient. Please refer to After Visit Summary for other counseling recommendations.  Return in 2 weeks (on 10/28/2015).  Eino FarberWalidah Kennith GainN Karim, CNM

## 2015-10-28 ENCOUNTER — Ambulatory Visit (INDEPENDENT_AMBULATORY_CARE_PROVIDER_SITE_OTHER): Payer: Medicaid Other | Admitting: Family Medicine

## 2015-10-28 VITALS — BP 128/86 | HR 74 | Wt 190.2 lb

## 2015-10-28 DIAGNOSIS — Z3403 Encounter for supervision of normal first pregnancy, third trimester: Secondary | ICD-10-CM

## 2015-10-28 LAB — POCT URINALYSIS DIP (DEVICE)
Bilirubin Urine: NEGATIVE
Glucose, UA: NEGATIVE mg/dL
Hgb urine dipstick: NEGATIVE
Ketones, ur: NEGATIVE mg/dL
Nitrite: NEGATIVE
Protein, ur: NEGATIVE mg/dL
Specific Gravity, Urine: 1.02 (ref 1.005–1.030)
Urobilinogen, UA: 0.2 mg/dL (ref 0.0–1.0)
pH: 7 (ref 5.0–8.0)

## 2015-10-28 LAB — OB RESULTS CONSOLE GBS: STREP GROUP B AG: NEGATIVE

## 2015-10-28 MED ORDER — RANITIDINE HCL 150 MG PO TABS: 150.0000 mg | ORAL_TABLET | Freq: Two times a day (BID) | ORAL | 2 refills | Status: DC

## 2015-10-28 NOTE — Progress Notes (Signed)
   PRENATAL VISIT NOTE  Subjective:  Michele Gardner is a 30 y.o. G3P1011 at 4570w3d being seen today for ongoing prenatal care.  She is currently monitored for the following issues for this low-risk pregnancy and has Transient hypertension of pregnancy in first trimester; Supervision of normal subsequent pregnancy; Rubella non-immune status, antepartum; and Anemia of mother in pregnancy, antepartum on her problem list.  Patient reports no complaints.  Contractions: Not present. Vag. Bleeding: None.  Movement: Present. Denies leaking of fluid.   The following portions of the patient's history were reviewed and updated as appropriate: allergies, current medications, past family history, past medical history, past social history, past surgical history and problem list. Problem list updated.  Objective:   Vitals:   10/28/15 1535  BP: 128/86  Pulse: 74  Weight: 190 lb 3.2 oz (86.3 kg)    Fetal Status: Fetal Heart Rate (bpm): 150   Movement: Present     General:  Alert, oriented and cooperative. Patient is in no acute distress.  Skin: Skin is warm and dry. No rash noted.   Cardiovascular: Normal heart rate noted  Respiratory: Normal respiratory effort, no problems with respiration noted  Abdomen: Soft, gravid, appropriate for gestational age. Pain/Pressure: Present     Pelvic:  Cervical exam deferred        Extremities: Normal range of motion.  Edema: None  Mental Status: Normal mood and affect. Normal behavior. Normal judgment and thought content.   Urinalysis:      Assessment and Plan:  Pregnancy: G3P1011 at 6670w3d  1. Supervision of normal first pregnancy, antepartum, third trimester FHT and FH normal. - Culture, beta strep (group b only) - GC/Chlamydia probe amp ([place])not at [facility]  Preterm labor symptoms and general obstetric precautions including but not limited to vaginal bleeding, contractions, leaking of fluid and fetal movement were reviewed in detail with the  patient. Please refer to After Visit Summary for other counseling recommendations.  Return in about 1 week (around 11/04/2015) for OB f/u.  Levie HeritageJacob J Ramona Slinger, DO

## 2015-10-28 NOTE — Patient Instructions (Signed)
AREA PEDIATRIC/FAMILY PRACTICE PHYSICIANS  [facility] [address], [address] [place], [place]  [address] Phone - [phone]   Fax - [phone]  [facility] [address] [place], [place] [address] Phone - [phone]   Fax - [phone]  [facility] [address] [place], [place]  [address] Phone - [phone]   Fax - [phone]  [facility] [address] [place], [place]  [address] Phone - [phone]   Fax - [phone]  [facility] [address] [place], [place]  [address] Phone - [phone]   Fax - [phone]  [facility] [address] [place], [place]  [address] Phone - [phone]   Fax - [phone]  [facility] [address] [place], [place]  [address] Phone - [phone]   Fax - [phone]  [facility] [address] [place], [place]  [address] Phone - [phone]   Fax - [phone]  [facility] [address] [place], [place]  [address] Phone - [phone]   Fax - [phone] [facility] [address] [place], [place]  [address] Phone - [phone]   Fax - [phone]  [facility] [address] [place], [place]  [address] Phone - [phone]   Fax - [phone]  [facility] [address] [place], [place]  [address] Phone - [phone]   Fax - [phone]  [facility] [address] [place], [place]  [address] Phone - [phone]   Fax - [phone]  [facility] G[facility] [address] [place], [place]  [address] Phone - [phone]  [facility] [address] [place], [place]  [address] Phone - [phone]   Fax - [phone]  [facility] [address] [place], [place]  [address] Phone - [phone]   Fax - [phone]  [facility] [address] [place], [place]  [address] Phone - [phone]   Fax - [phone]  [facility] [address] [place], [place]  [address] Phone - [phone]   Fax - [phone]   [facility] [address] [place], [place]  [address] Phone - [phone]   Fax - [phone]  [facility] [address] , [place]  [address] Phone - [phone]   Fax - [phone]  [facility] R[facility] [address] , [place]  [address] Phone - [phone]   Fax - [phone]  [facility] [address] , [place]  [address] Phone - [phone]   Fax - [phone]  [facility] [address] , [place]  [address] Phone - [phone]   Fax - [phone] [facility] [address] [place], [place]  [address] Phone - [phone]   Fax - [phone]  [facility] [address] [place], [place]  [address] Phone - [phone]   Fax - [phone]  [facility] [address] [place],   [address] Phone - [phone]   Fax - [phone]   

## 2015-10-30 LAB — CULTURE, BETA STREP (GROUP B ONLY)

## 2015-11-02 ENCOUNTER — Telehealth: Payer: Self-pay | Admitting: *Deleted

## 2015-11-02 ENCOUNTER — Encounter: Payer: Self-pay | Admitting: *Deleted

## 2015-11-02 NOTE — Telephone Encounter (Signed)
Pt left message yesterday @ 10 stating that she needs a letter for her job stating that she can still work. 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 I called pt today and stated that she can pick up the letter when she is here for her next scheduled visit on 9/21.  Pt agreed and voiced understanding.

## 2015-11-04 ENCOUNTER — Ambulatory Visit (INDEPENDENT_AMBULATORY_CARE_PROVIDER_SITE_OTHER): Payer: Medicaid Other | Admitting: Obstetrics and Gynecology

## 2015-11-04 VITALS — BP 125/75 | HR 71 | Wt 193.3 lb

## 2015-11-04 DIAGNOSIS — Z3483 Encounter for supervision of other normal pregnancy, third trimester: Secondary | ICD-10-CM

## 2015-11-04 NOTE — Progress Notes (Signed)
Prenatal Visit Note Date: 11/04/2015 [facility]  Subjective:  Michele Gardner is a 30 y.o. G3P1011 at 5451w3d being seen today for ongoing prenatal care.  She is currently monitored for the following issues for this low-risk pregnancy and has Transient hypertension of pregnancy in first trimester; Supervision of normal subsequent pregnancy; Rubella non-immune status, antepartum; and Anemia of mother in pregnancy, antepartum on her problem list.  Patient reports no complaints.   Contractions: Not present.  .  Movement: Present. Denies leaking of fluid.   The following portions of the patient's history were reviewed and updated as appropriate: allergies, current medications, past family history, past medical history, past social history, past surgical history and problem list. Problem list updated.  Objective:   Vitals:   11/04/15 1328  BP: 125/75  Pulse: 71  Weight: 193 lb 4.8 oz (87.7 kg)    Fetal Status: Fetal Heart Rate (bpm): 154 Fundal Height: 37 cm Movement: Present  Presentation: Vertex  General:  Alert, oriented and cooperative. Patient is in no acute distress.  Skin: Skin is warm and dry. No rash noted.   Cardiovascular: Normal heart rate noted  Respiratory: Normal respiratory effort, no problems with respiration noted  Abdomen: Soft, gravid, appropriate for gestational age. Pain/Pressure: Present     Pelvic:  Cervical exam deferred        Extremities: Normal range of motion.     Mental Status: Normal mood and affect. Normal behavior. Normal judgment and thought content.   Urinalysis:      Assessment and Plan:  Pregnancy: G3P1011 at 1751w3d  1. Encounter for supervision of other normal pregnancy in third trimester gbs neg Unsure about BC  Preterm labor symptoms and general obstetric precautions including but not limited to vaginal bleeding, contractions, leaking of fluid and fetal movement were reviewed in detail with the patient. Please  refer to After Visit Summary for other counseling recommendations.  Return in about 1 week (around 11/11/2015).   Williams Bingharlie Viren Lebeau, MD

## 2015-11-10 ENCOUNTER — Encounter: Payer: Self-pay | Admitting: Advanced Practice Midwife

## 2015-11-10 ENCOUNTER — Ambulatory Visit (INDEPENDENT_AMBULATORY_CARE_PROVIDER_SITE_OTHER): Payer: Medicaid Other | Admitting: Advanced Practice Midwife

## 2015-11-10 DIAGNOSIS — Z3483 Encounter for supervision of other normal pregnancy, third trimester: Secondary | ICD-10-CM

## 2015-11-10 NOTE — Patient Instructions (Signed)
Pregnancy and Influenza °Influenza, also called the flu, is an infection of the respiratory tract. If you are pregnant, you are more likely to catch the flu. You are also more likely to have a more serious case of the flu. This is because pregnancy lowers your body's ability to fight off infections (it weakens your immune system). It also puts additional stress on your heart and lungs, which makes you more likely to have complications. Having a bad case of the flu, especially with a high fever, can be dangerous for your developing baby. It can cause you to go into early labor. °HOW DO PEOPLE GET THE FLU? °The flu is caused by the influenza virus. This virus is common every year in the fall and winter. It spreads when virus particles get passed from person to person. You can get the virus if you are near a sick person who is coughing or sneezing. You can also get the virus if you touch something that has the virus on it and then touch your face. °HOW CAN I PROTECT MYSELF AGAINST THE FLU? °· Get a flu shot. The best way to prevent the flu is to get a flu shot before flu season starts. The flu shot is not dangerous for your developing baby. It may even help protect your baby from the flu for up to 6 months after birth. The flu shot is one type of flu vaccine. Another type is a nasal spray vaccine. Do not get the nasal spray vaccine. It is not approved for pregnancy. °· Do not come in close contact with sick people. °· Do not share food, drinks, or utensils with other people. °· Wash your hands often. Use hand sanitizer when soap and water are not available. °WHAT SHOULD I DO IF I HAVE FLU SYMPTOMS? °If you have any flu symptoms, call your health care provider right away. Flu symptoms include: °· Fever or chills. °· Muscle aches. °· Headache. °· Sore throat. °· Nasal congestion. °· Cough. °· Feeling tired. °· Loss of appetite. °· Vomiting. °· Diarrhea. °You may be able to take an antiviral medicine to keep the flu from  becoming severe and to shorten how long it lasts. °WHAT SHOULD I DO AT HOME IF I AM DIAGNOSED WITH THE FLU? °· Do not take any medicine, including cold or flu medicine, unless directed by your health care provider. °· If you take antiviral medicine, make sure you finish it even if you start to feel better. °· Drink enough fluid to keep your urine clear or pale yellow. °· Get plenty of rest. °WHEN WOULD I SEEK IMMEDIATE MEDICAL CARE IF I HAVE THE FLU? °· You have trouble breathing. °· You have chest pain. °· You begin to have labor pains. °· You have a high fever that does not go down after you take medicine. °· You do not feel your baby move. °· You have diarrhea or vomiting that will not go away. °  °This information is not intended to replace advice given to you by your health care provider. Make sure you discuss any questions you have with your health care provider. °  °Document Released: 12/03/2007 Document Revised: 02/04/2013 Document Reviewed: 12/27/2012 °Elsevier Interactive Patient Education ©2016 [employer] Inc[employer] ° °

## 2015-11-10 NOTE — Progress Notes (Signed)
   PRENATAL VISIT NOTE  Subjective:  Michele Gardner is a 30 y.o. G3P1011 at 1477w2d being seen today for ongoing prenatal care.  She is currently monitored for the following issues for this low-risk pregnancy and has Transient hypertension of pregnancy in first trimester; Supervision of normal subsequent pregnancy; Rubella non-immune status, antepartum; and Anemia of mother in pregnancy, antepartum on her problem list.  Patient reports occasional contractions.  Contractions: Regular. Vag. Bleeding: None.  Movement: Present. Denies leaking of fluid.   The following portions of the patient's history were reviewed and updated as appropriate: allergies, current medications, past family history, past medical history, past social history, past surgical history and problem list. Problem list updated.  Objective:   Vitals:   11/10/15 0925  BP: 129/84  Pulse: 85  Weight: 194 lb 1.6 oz (88 kg)    Fetal Status: Fetal Heart Rate (bpm): 150 Fundal Height: 38 cm Movement: Present  Presentation: Vertex  General:  Alert, oriented and cooperative. Patient is in no acute distress.  Skin: Skin is warm and dry. No rash noted.   Cardiovascular: Normal heart rate noted  Respiratory: Normal respiratory effort, no problems with respiration noted  Abdomen: Soft, gravid, appropriate for gestational age. Pain/Pressure: Present     Pelvic:  Cervical exam performed Dilation: 1 Effacement (%): 50 Station: Ballotable  Extremities: Normal range of motion.  Edema: None  Mental Status: Normal mood and affect. Normal behavior. Normal judgment and thought content.   Urinalysis:     GBS neg Assessment and Plan:  Pregnancy: G3P1011 at 4677w2d  1. Encounter for supervision of other normal pregnancy in third trimester   Term labor symptoms and general obstetric precautions including but not limited to vaginal bleeding, contractions, leaking of fluid and fetal movement were reviewed in detail with the patient. Please  refer to After Visit Summary for other counseling recommendations.  F/U 1 week  [place] Debrina Kizer, [place]M

## 2015-11-17 ENCOUNTER — Ambulatory Visit (INDEPENDENT_AMBULATORY_CARE_PROVIDER_SITE_OTHER): Payer: Medicaid Other | Admitting: Obstetrics and Gynecology

## 2015-11-17 VITALS — BP 119/85 | HR 74 | Wt 197.7 lb

## 2015-11-17 DIAGNOSIS — Z23 Encounter for immunization: Secondary | ICD-10-CM

## 2015-11-17 DIAGNOSIS — Z3483 Encounter for supervision of other normal pregnancy, third trimester: Secondary | ICD-10-CM

## 2015-11-17 DIAGNOSIS — Z3480 Encounter for supervision of other normal pregnancy, unspecified trimester: Secondary | ICD-10-CM

## 2015-11-17 NOTE — Progress Notes (Signed)
Prenatal Visit Note Date: 11/17/2015 Clinic: Michele [facility]  Subjective:  Michele Gardner is a 30 y.o. G3P1011 at 244w2d being seen today for ongoing prenatal care.  She is currently monitored for the following issues for this low-risk pregnancy and has Transient hypertension of pregnancy in first trimester; Encounter for supervision of normal pregnancy in multigravida; Rubella non-immune status, antepartum; and Anemia of mother in pregnancy, antepartum on her problem list.  Patient reports no complaints.   Contractions: Irregular. Vag. Bleeding: None.  Movement: Present. Denies leaking of fluid.   The following portions of the patient's history were reviewed and updated as appropriate: allergies, current medications, past family history, past medical history, past social history, past surgical history and problem list. Problem list updated.  Objective:   Vitals:   11/17/15 0917  BP: 119/85  Pulse: 74  Weight: 197 lb 11.2 oz (89.7 kg)    Fetal Status: Fetal Heart Rate (bpm): 156 Fundal Height: 38 cm Movement: Present  Presentation: Vertex  General:  Alert, oriented and cooperative. Patient is in no acute distress.  Skin: Skin is warm and dry. No rash noted.   Cardiovascular: Normal heart rate noted  Respiratory: Normal respiratory effort, no problems with respiration noted  Abdomen: Soft, gravid, appropriate for gestational age. Pain/Pressure: Present     Pelvic:  pt desired check.  Dilation: 1 Effacement (%): 50 Station: Ballotable  Extremities: Normal range of motion.  Edema: None  Mental Status: Normal mood and affect. Normal behavior. Normal judgment and thought content.   Urinalysis:      Assessment and Plan:  Pregnancy: G3P1011 at 6944w2d  1. Needs flu shot - Flu Vaccine QUAD 36+ mos IM (Fluarix, Quad PF)  2. Encounter for supervision of normal pregnancy in multigravida Routine care. IUD  Term labor symptoms, FKC precautions and general obstetric  precautions including but not limited to vaginal bleeding, contractions, leaking of fluid and fetal movement were reviewed in detail with the patient. Please refer to After Visit Summary for other counseling recommendations.  Return in about 1 week (around 11/24/2015).   Michele [place] Bingharlie Jasmynn Pfalzgraf, MD

## 2015-11-17 NOTE — Progress Notes (Signed)
Flu vaccine today 

## 2015-11-24 ENCOUNTER — Ambulatory Visit (INDEPENDENT_AMBULATORY_CARE_PROVIDER_SITE_OTHER): Payer: Medicaid Other | Admitting: Family Medicine

## 2015-11-24 VITALS — BP 132/87 | HR 82 | Wt 194.4 lb

## 2015-11-24 DIAGNOSIS — Z283 Underimmunization status: Secondary | ICD-10-CM

## 2015-11-24 DIAGNOSIS — O131 Gestational [pregnancy-induced] hypertension without significant proteinuria, first trimester: Secondary | ICD-10-CM

## 2015-11-24 DIAGNOSIS — Z3480 Encounter for supervision of other normal pregnancy, unspecified trimester: Secondary | ICD-10-CM

## 2015-11-24 DIAGNOSIS — Z2839 Other underimmunization status: Secondary | ICD-10-CM

## 2015-11-24 DIAGNOSIS — D649 Anemia, unspecified: Secondary | ICD-10-CM

## 2015-11-24 DIAGNOSIS — O99019 Anemia complicating pregnancy, unspecified trimester: Secondary | ICD-10-CM

## 2015-11-24 DIAGNOSIS — O9989 Other specified diseases and conditions complicating pregnancy, childbirth and the puerperium: Secondary | ICD-10-CM

## 2015-11-24 DIAGNOSIS — O99013 Anemia complicating pregnancy, third trimester: Secondary | ICD-10-CM

## 2015-11-24 NOTE — Progress Notes (Addendum)
Subjective:  Michele Gardner is a 30 y.o. G3P1011 at 88w2dbeing seen today for ongoing prenatal care.  She is currently monitored for the following issues for this low-risk pregnancy and has Transient hypertension of pregnancy in first trimester; Encounter for supervision of normal pregnancy in multigravida; Rubella non-immune status, antepartum; and Anemia of mother in pregnancy, antepartum on her problem list.  Patient reports no complaints.  Contractions: Irregular.  .  Movement: Present. Denies leaking of fluid.   The following portions of the patient's history were reviewed and updated as appropriate: allergies, current medications, past family history, past medical history, past social history, past surgical history and problem list. Problem list updated.  Objective:   Vitals:   11/24/15 0934  BP: 132/87  Pulse: 82  Weight: 194 lb 6.4 oz (88.2 kg)    Fetal Status: Fetal Heart Rate (bpm): 127   Movement: Present   FH: 39cm  General:  Alert, oriented and cooperative. Patient is in no acute distress.  Skin: Skin is warm and dry. No rash noted.   Cardiovascular: Normal heart rate noted  Respiratory: Normal respiratory effort, no problems with respiration noted  Abdomen: Soft, gravid, appropriate for gestational age. Cephalic presentation by Leopold's.  Pain/Pressure: Present     Pelvic:  Cervical exam deferred No regular contractions since last visit.        Extremities: Normal range of motion.     Mental Status: Normal mood and affect. Normal behavior. Normal judgment and thought content.   Urinalysis:      Assessment and Plan:  Pregnancy: G3P1011 at 354w2d1. Encounter for supervision of normal pregnancy in multigravida - Normal exam, normal pressures.  2. Rubella non-immune status, antepartum - MMR postpartum  3. Anemia of mother in pregnancy, antepartum  4. Transient hypertension of pregnancy in first trimester - Reviewed all BPs of pregnancy, all have been within the  normal range. Unsure of diagnosis, per patient she had some mildly elevated BPs when she was sick, while in ED visit once in the past, but otherwise has never had HTN.  Term labor symptoms and general obstetric precautions including but not limited to vaginal bleeding, contractions, leaking of fluid and fetal movement were reviewed in detail with the patient. Please refer to After Visit Summary for other counseling recommendations.  Return in about 1 week (around 12/01/2015) for Routine OB visit.   [place]

## 2015-11-24 NOTE — Patient Instructions (Signed)

## 2015-11-30 ENCOUNTER — Encounter: Payer: Medicaid Other | Admitting: Obstetrics & Gynecology

## 2015-11-30 ENCOUNTER — Encounter (HOSPITAL_COMMUNITY): Payer: Self-pay | Admitting: *Deleted

## 2015-11-30 ENCOUNTER — Ambulatory Visit (INDEPENDENT_AMBULATORY_CARE_PROVIDER_SITE_OTHER): Payer: Medicaid Other | Admitting: Advanced Practice Midwife

## 2015-11-30 ENCOUNTER — Inpatient Hospital Stay (HOSPITAL_COMMUNITY)
Admission: AD | Admit: 2015-11-30 | Discharge: 2015-12-01 | DRG: 775 | Disposition: A | Discharge status: Home / Self Care | Payer: Medicaid Other | Source: Ambulatory Visit | Attending: Obstetrics & Gynecology | Admitting: Obstetrics & Gynecology

## 2015-11-30 VITALS — BP 137/93 | HR 76 | Wt 196.8 lb

## 2015-11-30 DIAGNOSIS — Z2839 Other underimmunization status: Secondary | ICD-10-CM

## 2015-11-30 DIAGNOSIS — O131 Gestational [pregnancy-induced] hypertension without significant proteinuria, first trimester: Secondary | ICD-10-CM

## 2015-11-30 DIAGNOSIS — O480 Post-term pregnancy: Secondary | ICD-10-CM | POA: Diagnosis present

## 2015-11-30 DIAGNOSIS — Z8249 Family history of ischemic heart disease and other diseases of the circulatory system: Secondary | ICD-10-CM | POA: Diagnosis not present

## 2015-11-30 DIAGNOSIS — Z3A40 40 weeks gestation of pregnancy: Secondary | ICD-10-CM | POA: Diagnosis not present

## 2015-11-30 DIAGNOSIS — Z3480 Encounter for supervision of other normal pregnancy, unspecified trimester: Secondary | ICD-10-CM

## 2015-11-30 DIAGNOSIS — I10 Essential (primary) hypertension: Secondary | ICD-10-CM | POA: Diagnosis present

## 2015-11-30 DIAGNOSIS — Z880 Allergy status to penicillin: Secondary | ICD-10-CM | POA: Diagnosis not present

## 2015-11-30 DIAGNOSIS — O09899 Supervision of other high risk pregnancies, unspecified trimester: Secondary | ICD-10-CM

## 2015-11-30 DIAGNOSIS — O134 Gestational [pregnancy-induced] hypertension without significant proteinuria, complicating childbirth: Principal | ICD-10-CM | POA: Diagnosis present

## 2015-11-30 DIAGNOSIS — O99019 Anemia complicating pregnancy, unspecified trimester: Secondary | ICD-10-CM

## 2015-11-30 DIAGNOSIS — O133 Gestational [pregnancy-induced] hypertension without significant proteinuria, third trimester: Secondary | ICD-10-CM

## 2015-11-30 DIAGNOSIS — O9989 Other specified diseases and conditions complicating pregnancy, childbirth and the puerperium: Secondary | ICD-10-CM

## 2015-11-30 DIAGNOSIS — Z87891 Personal history of nicotine dependence: Secondary | ICD-10-CM | POA: Diagnosis not present

## 2015-11-30 DIAGNOSIS — R030 Elevated blood-pressure reading, without diagnosis of hypertension: Secondary | ICD-10-CM | POA: Diagnosis present

## 2015-11-30 DIAGNOSIS — Z833 Family history of diabetes mellitus: Secondary | ICD-10-CM

## 2015-11-30 DIAGNOSIS — Z283 Underimmunization status: Secondary | ICD-10-CM

## 2015-11-30 HISTORY — DX: Essential (primary) hypertension: I10

## 2015-11-30 LAB — CBC
HCT: 34.2 % — ABNORMAL LOW (ref 36.0–46.0)
Hemoglobin: 11.6 g/dL — ABNORMAL LOW (ref 12.0–15.0)
MCH: 28.9 pg (ref 26.0–34.0)
MCHC: 33.9 g/dL (ref 30.0–36.0)
MCV: 85.3 fL (ref 78.0–100.0)
Platelets: 217 10*3/uL (ref 150–400)
RBC: 4.01 MIL/uL (ref 3.87–5.11)
RDW: 15.4 % (ref 11.5–15.5)
WBC: 8.7 10*3/uL (ref 4.0–10.5)

## 2015-11-30 LAB — COMPREHENSIVE METABOLIC PANEL
ALBUMIN: 3 g/dL — AB (ref 3.5–5.0)
ALK PHOS: 139 U/L — AB (ref 38–126)
ALT: 17 U/L (ref 14–54)
ANION GAP: 9 (ref 5–15)
AST: 32 U/L (ref 15–41)
BUN: 6 mg/dL (ref 6–20)
CALCIUM: 8.7 mg/dL — AB (ref 8.9–10.3)
CO2: 19 mmol/L — AB (ref 22–32)
Chloride: 107 mmol/L (ref 101–111)
Creatinine, Ser: 0.5 mg/dL (ref 0.44–1.00)
GFR calc Af Amer: >60 mL/min (ref >60)
GFR calc non Af Amer: >60 mL/min (ref >60)
GLUCOSE: 94 mg/dL (ref 65–99)
Potassium: 3.8 mmol/L (ref 3.5–5.1)
SODIUM: 135 mmol/L (ref 135–145)
Total Bilirubin: 0.6 mg/dL (ref 0.3–1.2)
Total Protein: 6.7 g/dL (ref 6.5–8.1)

## 2015-11-30 LAB — PROTEIN / CREATININE RATIO, URINE
Creatinine, Urine: 36 mg/dL
Total Protein, Urine: <6 mg/dL

## 2015-11-30 LAB — TYPE AND SCREEN
ABO/RH(D): O POS
ANTIBODY SCREEN: NEGATIVE

## 2015-11-30 MED ORDER — ONDANSETRON HCL 4 MG/2ML IJ SOLN: 4.0000 mg | Freq: Four times a day (QID) | INTRAMUSCULAR | Status: DC | PRN

## 2015-11-30 MED ORDER — DIBUCAINE 1 % RE OINT: 1.0000 "application " | TOPICAL_OINTMENT | RECTAL | Status: DC | PRN

## 2015-11-30 MED ORDER — FENTANYL CITRATE (PF) 100 MCG/2ML IJ SOLN
100.0000 ug | INTRAMUSCULAR | Status: DC | PRN
  Administered 2015-11-30: 100 ug via INTRAVENOUS
  Filled 2015-11-30: qty 2

## 2015-11-30 MED ORDER — ONDANSETRON HCL 4 MG/2ML IJ SOLN: 4.0000 mg | INTRAMUSCULAR | Status: DC | PRN

## 2015-11-30 MED ORDER — LIDOCAINE HCL (PF) 1 % IJ SOLN
30.0000 mL | INTRAMUSCULAR | Status: DC | PRN
  Administered 2015-11-30: 30 mL via SUBCUTANEOUS
  Filled 2015-11-30: qty 30

## 2015-11-30 MED ORDER — COCONUT OIL OIL: 1.0000 "application " | TOPICAL_OIL | Status: DC | PRN

## 2015-11-30 MED ORDER — ONDANSETRON HCL 4 MG PO TABS: 4.0000 mg | ORAL_TABLET | ORAL | Status: DC | PRN

## 2015-11-30 MED ORDER — MISOPROSTOL 25 MCG QUARTER TABLET
25.0000 ug | ORAL_TABLET | ORAL | Status: DC | PRN
  Filled 2015-11-30: qty 1

## 2015-11-30 MED ORDER — OXYCODONE-ACETAMINOPHEN 5-325 MG PO TABS: 1.0000 | ORAL_TABLET | ORAL | Status: DC | PRN

## 2015-11-30 MED ORDER — DIPHENHYDRAMINE HCL 25 MG PO CAPS: 25.0000 mg | ORAL_CAPSULE | Freq: Four times a day (QID) | ORAL | Status: DC | PRN

## 2015-11-30 MED ORDER — LACTATED RINGERS IV SOLN: 500.0000 mL | INTRAVENOUS | Status: DC | PRN

## 2015-11-30 MED ORDER — ZOLPIDEM TARTRATE 5 MG PO TABS: 5.0000 mg | ORAL_TABLET | Freq: Every evening | ORAL | Status: DC | PRN

## 2015-11-30 MED ORDER — OXYTOCIN 40 UNITS IN LACTATED RINGERS INFUSION - SIMPLE MED
1.0000 m[IU]/min | INTRAVENOUS | Status: DC
  Administered 2015-11-30: 2 m[IU]/min via INTRAVENOUS
  Filled 2015-11-30: qty 1000

## 2015-11-30 MED ORDER — PRENATAL MULTIVITAMIN CH
1.0000 | ORAL_TABLET | Freq: Every day | ORAL | Status: DC
  Administered 2015-12-01: 1 via ORAL
  Filled 2015-11-30: qty 1

## 2015-11-30 MED ORDER — BENZOCAINE-MENTHOL 20-0.5 % EX AERO: 1.0000 "application " | INHALATION_SPRAY | CUTANEOUS | Status: DC | PRN

## 2015-11-30 MED ORDER — FLEET ENEMA 7-19 GM/118ML RE ENEM: 1.0000 | ENEMA | Freq: Every day | RECTAL | Status: DC | PRN

## 2015-11-30 MED ORDER — ACETAMINOPHEN 325 MG PO TABS: 650.0000 mg | ORAL_TABLET | ORAL | Status: DC | PRN

## 2015-11-30 MED ORDER — IBUPROFEN 600 MG PO TABS
600.0000 mg | ORAL_TABLET | Freq: Four times a day (QID) | ORAL | Status: DC
  Filled 2015-11-30 (×3): qty 1

## 2015-11-30 MED ORDER — SIMETHICONE 80 MG PO CHEW: 80.0000 mg | CHEWABLE_TABLET | ORAL | Status: DC | PRN

## 2015-11-30 MED ORDER — LACTATED RINGERS IV SOLN
INTRAVENOUS | Status: DC
  Administered 2015-11-30: 10:00:00 via INTRAVENOUS

## 2015-11-30 MED ORDER — WITCH HAZEL-GLYCERIN EX PADS: 1.0000 "application " | MEDICATED_PAD | CUTANEOUS | Status: DC | PRN

## 2015-11-30 MED ORDER — SENNOSIDES-DOCUSATE SODIUM 8.6-50 MG PO TABS
2.0000 | ORAL_TABLET | ORAL | Status: DC
  Administered 2015-12-01: 2 via ORAL
  Filled 2015-11-30: qty 2

## 2015-11-30 MED ORDER — OXYTOCIN 40 UNITS IN LACTATED RINGERS INFUSION - SIMPLE MED: 2.5000 [IU]/h | INTRAVENOUS | Status: DC

## 2015-11-30 MED ORDER — SENNOSIDES-DOCUSATE SODIUM 8.6-50 MG PO TABS: 2.0000 | ORAL_TABLET | ORAL | Status: DC

## 2015-11-30 MED ORDER — IBUPROFEN 600 MG PO TABS
600.0000 mg | ORAL_TABLET | Freq: Four times a day (QID) | ORAL | Status: DC
  Administered 2015-11-30 – 2015-12-01 (×4): 600 mg via ORAL
  Filled 2015-11-30 (×4): qty 1

## 2015-11-30 MED ORDER — OXYCODONE-ACETAMINOPHEN 5-325 MG PO TABS
2.0000 | ORAL_TABLET | ORAL | Status: DC | PRN
Start: 2015-11-30 — End: 2015-11-30
  Administered 2015-11-30: 2 via ORAL
  Filled 2015-11-30: qty 2

## 2015-11-30 MED ORDER — TETANUS-DIPHTH-ACELL PERTUSSIS 5-2.5-18.5 LF-MCG/0.5 IM SUSP: 0.5000 mL | Freq: Once | INTRAMUSCULAR | Status: DC

## 2015-11-30 MED ORDER — PRENATAL MULTIVITAMIN CH: 1.0000 | ORAL_TABLET | Freq: Every day | ORAL | Status: DC

## 2015-11-30 MED ORDER — TERBUTALINE SULFATE 1 MG/ML IJ SOLN
0.2500 mg | Freq: Once | INTRAMUSCULAR | Status: DC | PRN
  Filled 2015-11-30: qty 1

## 2015-11-30 MED ORDER — ACETAMINOPHEN 325 MG PO TABS
650.0000 mg | ORAL_TABLET | ORAL | Status: DC | PRN
  Administered 2015-12-01: 650 mg via ORAL
  Filled 2015-11-30: qty 2

## 2015-11-30 MED ORDER — SOD CITRATE-CITRIC ACID 500-334 MG/5ML PO SOLN: 30.0000 mL | ORAL | Status: DC | PRN

## 2015-11-30 MED ORDER — OXYTOCIN BOLUS FROM INFUSION
500.0000 mL | Freq: Once | INTRAVENOUS | Status: AC
  Administered 2015-11-30: 500 mL via INTRAVENOUS

## 2015-11-30 NOTE — H&P (Signed)
Michele Gardner is a 30 y.o. female G3p1011 by early Ultrasound by presenting for induction of labor due to elevated blood pressures in clinic. States she has been having contractions on and off for the past month but have increased in the past; contractions are now q 102min-5 min.  OB History    Gravida Para Term Preterm AB Living   3 1 1  0 1 1   SAB TAB Ectopic Multiple Live Births   0 1 0 0 1     Past Medical History:  Diagnosis Date  . Asthma   . Hypertension   . Medical history non-contributory    Past Surgical History:  Procedure Laterality Date  . NO PAST SURGERIES    . WISDOM TOOTH EXTRACTION  2007   Family History: family history includes Cancer in her maternal grandfather and paternal grandmother; Diabetes in her paternal grandfather; Hypertension in her mother. Social History:  reports that she quit smoking about 8 months ago. She smoked 0.50 packs per day. She has never used smokeless tobacco. She reports that she does not drink alcohol or use drugs.   Clinic  Prenatal Labs  Dating  Blood type: O/POS/-- (06/07 1204)   Genetic Screen 1 Screen:    AFP:     Quad:     NIPS: Antibody:NEG (06/07 1204)  Anatomic [place]  Rubella: <0.90 (04/03 0939)  GTT Early:               Third trimester:  RPR: NON REAC (07/27 0919)   Flu vaccine  HBsAg: NEGATIVE (04/03 0939)   TDaP vaccine                                               Rhogam: HIV: NONREACTIVE (07/27 0919)   Baby Food                                               GBS: (For PCN allergy, check sensitivities)   Contraception  Pap:  Circumcision    Pediatrician    Support Person         Maternal Diabetes: No Genetic Screening: Declined Maternal Ultrasounds/Referrals:  None Fetal Ultrasounds or other Referrals:  None Maternal Substance Abuse:  No Significant Maternal Medications: Uses her inhaler PRN (albuterol); hasn't used in 2 years.  Significant Maternal Lab Results:  Lab values include: Group B Strep negative Other  Comments:  None  ROS Maternal Medical History:  Contractions: Onset was 13-24 hours ago.   Frequency: irregular.   Perceived severity is mild.    Fetal activity: Perceived fetal activity is normal.    Prenatal complications: PIH.   No bleeding or preterm labor.       Blood pressure (!) 139/91, pulse 77, temperature 98.3 F (36.8 C), temperature source Oral, resp. rate 18, height 5\' 5"  (1.651 m), weight 88.9 kg (196 lb), last menstrual period 02/22/2015. Exam Physical Exam  Constitutional: She is oriented to person, place, and time. She appears well-developed and well-nourished.  HENT:  Head: Normocephalic.  Eyes: Conjunctivae and EOM are normal.  Neck: Normal range of motion.  Cardiovascular: Intact distal pulses.   Respiratory: Effort normal.  GI: Soft. Bowel sounds are normal.  Musculoskeletal: Normal range of motion.  Neurological: She is alert and oriented to person, place, and time.  Skin: Skin is warm and dry.  Psychiatric: She has a normal mood and affect.    Prenatal labs: ABO, Rh: O/POS/-- (06/07 1204) Antibody: NEG (06/07 1204) Rubella: <0.90 (04/03 0939) RPR: NON REAC (07/27 0919)  HBsAg: NEGATIVE (04/03 0939)  HIV: NONREACTIVE (07/27 0919)  GBS:   negative  Assessment/Plan: FHR is 150 with accelerations and no decelerations and moderate variability.  Patient is a 40 and 1 week G3 P1011 here for induction of labor due to high blood pressure in the clinic.  Will admit for pitocin induction and plan for NSVD.  Charlesetta GaribaldiKathryn Lorraine Kooistra CNM 11/30/2015, 10:37 AM

## 2015-11-30 NOTE — Progress Notes (Signed)
Patient was seen and examined at the bedside. Contraction have increased in frequency to every 2 minutes with a Category 1 tracing. Patient ROM about 1 hour ago with cervical dilation estimated at 4.0. Plan to continue Pitocin and allow for continued progression of labor.

## 2015-11-30 NOTE — Progress Notes (Signed)
   PRENATAL VISIT NOTE  Subjective:  Michele Gardner is a 30 y.o. G3P1011 at 6162w1d being seen today for ongoing prenatal care.  She is currently monitored for the following issues for this low-risk pregnancy and has Transient hypertension of pregnancy in first trimester; Encounter for supervision of normal pregnancy in multigravida; Rubella non-immune status, antepartum; and Anemia of mother in pregnancy, antepartum on her problem list.  Patient reports irregular contractions, constant pelvic pressure.  Contractions: Irregular. Vag. Bleeding: None.  Movement: Present. Denies leaking of fluid.   The following portions of the patient's history were reviewed and updated as appropriate: allergies, current medications, past family history, past medical history, past social history, past surgical history and problem list. Problem list updated.  Objective:   Vitals:   11/30/15 0815 11/30/15 0818  BP: (!) 142/86 (!) 137/93  Pulse: 76   Weight: 196 lb 12.8 oz (89.3 kg)     Fetal Status:     Movement: Present  Presentation: Vertex  General:  Alert, oriented and cooperative. Patient is in no acute distress.  Skin: Skin is warm and dry. No rash noted.   Cardiovascular: Normal heart rate noted  Respiratory: Normal respiratory effort, no problems with respiration noted  Abdomen: Soft, gravid, appropriate for gestational age. Pain/Pressure: Present     Pelvic:  Cervical exam performed Dilation: 3.5 Effacement (%): 80 Station: -2 Membranes swept  Extremities: Normal range of motion.  Edema: None  Mental Status: Normal mood and affect. Normal behavior. Normal judgment and thought content.   Assessment and Plan:  Pregnancy: G3P1011 at 6662w1d  1. Post-term pregnancy, 40-42 weeks of gestation  - Fetal nonstress test--reactive  2. Gestational hypertension w/o significant proteinuria in 3rd trimester --Elevated BP x 2 today. Previously transient HTN noted during pregnancy.  No h/a, episgastric pain, or  visual disturbances reported. Cervix favorable. --[facility]. Walked to registration by clinic staff.   Please refer to After Visit Summary for other counseling recommendations.  No Follow-up on file.  Hurshel PartyLisa A Leftwich-Kirby, CNM

## 2015-11-30 NOTE — Anesthesia Pain Management Evaluation Note (Signed)
  CRNA Pain Management Visit Note  Patient: Michele Gardner, 30 y.o., female  "Hello I am a [facility] of the [facility]. We have an anesthesia team available at all times to provide care throughout the hospital, including epidural management and anesthesia for C-section. I don't know your plan for the delivery whether it a natural birth, water birth, IV sedation, nitrous supplementation, doula or epidural, but we want to meet your pain goals."   1.Was your pain managed to your expectations on prior hospitalizations?   Yes   2.What is your expectation for pain management during this hospitalization?     Labor support without medications  3.How can we help you reach that goal? Nursing support and comfort measures  Record the patient's initial score and the patient's pain goal.   Pain: 7  Pain Goal: 0-10 [facility] wants you to be able to say your pain was always managed very well.  [facility] 11/30/2015

## 2015-11-30 NOTE — Progress Notes (Addendum)
Patient resting in bed, comfortable feels mild contractions. Pitocin to be started at 2 x 2. FHR 130 with acceleartions and no decerlations and moderate variability (Cat 1 tracing).

## 2015-12-01 ENCOUNTER — Ambulatory Visit: Payer: Self-pay

## 2015-12-01 LAB — RPR: RPR: NONREACTIVE

## 2015-12-01 MED ORDER — IBUPROFEN 600 MG PO TABS: 600.0000 mg | ORAL_TABLET | Freq: Four times a day (QID) | ORAL | 0 refills | Status: DC

## 2015-12-01 MED ORDER — MEASLES, MUMPS & RUBELLA VAC ~~LOC~~ INJ
0.5000 mL | INJECTION | Freq: Once | SUBCUTANEOUS | Status: AC
  Administered 2015-12-01: 0.5 mL via SUBCUTANEOUS
  Filled 2015-12-01: qty 0.5

## 2015-12-01 MED ORDER — ACETAMINOPHEN 325 MG PO TABS: 650.0000 mg | ORAL_TABLET | ORAL | 3 refills | Status: DC | PRN

## 2015-12-01 NOTE — Discharge Summary (Signed)
OB Discharge Summary     Patient Name: Michele Gardner DOB: April 06, 1985 MRN: [id number]  Date of admission: 11/30/2015 Delivering MD: Marylene Land   Date of discharge: 12/01/2015  Admitting diagnosis: INDUCTION Intrauterine pregnancy: [redacted]w[redacted]d     Secondary diagnosis:  Active Problems:   Hypertension affecting pregnancy in third trimester  Additional problems: rubella not immune     Discharge diagnosis: Term Pregnancy Delivered                                                                                                Post partum procedures:none  Augmentation: Pitocin  Complications: None  Hospital course:  Induction of Labor With Vaginal Delivery   30 y.o. yo [id number] at [redacted]w[redacted]d was admitted to the hospital 11/30/2015 for induction of labor.  Indication for induction: elevated blood pressure at full term.  Patient had an uncomplicated labor course as follows: Membrane Rupture Time/Date: 1:26 PM ,11/30/2015   Intrapartum Procedures: Episiotomy: None [1]                                         Lacerations:  1st degree [2]  Patient had delivery of a Viable infant.  Information for the patient's newborn:  Tannah, Dreyfuss [[id number]]  Delivery Method: Vaginal, Spontaneous Delivery (Filed from Delivery Summary)   IOL for elevated BP in clinic at full term. Occasional elevated BP during labor, but ultimately not diagnosed with a hypertensive disorder, did not require treatment, and BPs wnl postpartum. Rubella not immune, vaccine ordered to be given prior to discharge.  11/30/2015  Details of delivery can be found in separate delivery note.  Patient had a routine postpartum course. Patient is discharged home 12/01/15.   Physical exam Vitals:   11/30/15 1816 11/30/15 1848 11/30/15 2215 12/01/15 0640  BP: (!) 116/95 138/85 133/86 132/85  Pulse: 76 96 86 80  Resp: 18 18 18 18   Temp: 99.2 F (37.3 C) 99.3 F (37.4 C) 98.7 F (37.1 C) 98 F (36.7 C)  TempSrc:  Oral Oral Oral Oral  SpO2:   99% 99%  Weight:      Height:       General: alert, cooperative and no distress Lochia: appropriate Uterine Fundus: firm Incision: N/A DVT Evaluation: No cords or calf tenderness. No significant calf/ankle edema. Labs: Lab Results  Component Value Date   WBC 8.7 11/30/2015   HGB 11.6 (L) 11/30/2015   HCT 34.2 (L) 11/30/2015   MCV 85.3 11/30/2015   PLT 217 11/30/2015   CMP Latest Ref Rng & Units 11/30/2015  Glucose 65 - 99 mg/dL 94  BUN 6 - 20 mg/dL 6  Creatinine 1.30 - 8.65 mg/dL 7.84  Sodium 696 - 295 mmol/L 135  Potassium 3.5 - 5.1 mmol/L 3.8  Chloride 101 - 111 mmol/L 107  CO2 22 - 32 mmol/L 19(L)  Calcium 8.9 - 10.3 mg/dL 2.8(U)  Total Protein 6.5 - 8.1 g/dL 6.7  Total Bilirubin 0.3 - 1.2 mg/dL 0.6  Alkaline Phos  38 - 126 U/L 139(H)  AST 15 - 41 U/L 32  ALT 14 - 54 U/L 17    Discharge instruction: per After Visit Summary and "Baby and Me Booklet".  After visit meds:    Medication List    STOP taking these medications   ranitidine 150 MG tablet Commonly known as:  ZANTAC     TAKE these medications   acetaminophen 325 MG tablet Commonly known as:  TYLENOL Take 2 tablets (650 mg total) by mouth every 4 (four) hours as needed (for pain scale < 4).   ibuprofen 600 MG tablet Commonly known as:  ADVIL,MOTRIN Take 1 tablet (600 mg total) by mouth every 6 (six) hours.   multivitamin-prenatal 27-0.8 MG Tabs tablet Take 1 tablet by mouth daily at 12 noon.       Diet: routine diet  Activity: Advance as tolerated. Pelvic rest for 6 weeks.   Outpatient follow up:6 weeks Follow up Appt:No future appointments. Follow up Visit:No Follow-up on file.  Postpartum contraception: IUD Paragard  Newborn Data: Live born female  Birth Weight: 8 lb 6.2 oz (3805 g) APGAR: 8, 9  Baby Feeding: Bottle and Breast Disposition: pending   12/01/2015 Silvano BilisNoah B Sophia Cubero, MD

## 2015-12-01 NOTE — Lactation Note (Signed)
This note was copied from a baby's chart. Lactation Consultation Note  Patient Name: Michele Gardner ONGEX'BToday's Date: 12/01/2015 Reason for consult: Initial assessment Baby at 26 hr of life. Mom desires early d/c but has not been seen by lactation. Upon entry it appears that baby has been bf. Mom stated baby just came off the breast. She reports bf is going well. She denies breast or nipple pain, voiced no concerns. Left lactation number on the white board for mom to call at the next feeding. Discussed baby behavior, feeding frequency, baby belly size, voids, wt loss, breast changes, and nipple care. She stated she can manually express. Given lactation handouts. Aware of OP services and support group.    Maternal Data Has patient been taught Hand Expression?: Yes Does the patient have breastfeeding experience prior to this delivery?: Yes  Feeding Feeding Type: Breast Fed Length of feed: 30 min  LATCH Score/Interventions                      Lactation Tools Discussed/Used WIC Program: No   Consult Status Consult Status: Follow-up Date: 12/01/15 Follow-up type: In-patient    Rulon Eisenmengerlizabeth E Kennice Finnie 12/01/2015, 5:48 PM

## 2015-12-01 NOTE — Progress Notes (Signed)
UR chart review completed.  

## 2015-12-01 NOTE — Discharge Instructions (Signed)

## 2016-01-05 ENCOUNTER — Ambulatory Visit (INDEPENDENT_AMBULATORY_CARE_PROVIDER_SITE_OTHER): Payer: Medicaid Other | Admitting: Family Medicine

## 2016-01-05 DIAGNOSIS — G44229 Chronic tension-type headache, not intractable: Secondary | ICD-10-CM

## 2016-01-05 DIAGNOSIS — O1003 Pre-existing essential hypertension complicating the puerperium: Secondary | ICD-10-CM

## 2016-01-05 DIAGNOSIS — R12 Heartburn: Secondary | ICD-10-CM

## 2016-01-05 MED ORDER — RANITIDINE HCL 150 MG PO TABS: 150.0000 mg | ORAL_TABLET | Freq: Two times a day (BID) | ORAL | 1 refills | Status: DC | PRN

## 2016-01-05 MED ORDER — CYCLOBENZAPRINE HCL 5 MG PO TABS: 5.0000 mg | ORAL_TABLET | Freq: Two times a day (BID) | ORAL | 0 refills | Status: DC | PRN

## 2016-01-05 MED ORDER — AMLODIPINE BESYLATE 5 MG PO TABS: 5.0000 mg | ORAL_TABLET | Freq: Every day | ORAL | 1 refills | Status: DC

## 2016-01-05 NOTE — Patient Instructions (Signed)
Hypertension Hypertension is another name for high blood pressure. High blood pressure forces your heart to work harder to pump blood. A blood pressure reading has two numbers, which includes a higher number over a lower number (example: 110/72). Follow these instructions at home:  Have your blood pressure rechecked by your doctor.  Only take medicine as told by your doctor. Follow the directions carefully. The medicine does not work as well if you skip doses. Skipping doses also puts you at risk for problems.  Do not smoke.  Monitor your blood pressure at home as told by your doctor. Contact a doctor if:  You think you are having a reaction to the medicine you are taking.  You have repeat headaches or feel dizzy.  You have puffiness (swelling) in your ankles.  You have trouble with your vision. Get help right away if:  You get a very bad headache and are confused.  You feel weak, numb, or faint.  You get chest or belly (abdominal) pain.  You throw up (vomit).  You cannot breathe very well. This information is not intended to replace advice given to you by your health care provider. Make sure you discuss any questions you have with your health care provider. Document Released: 07/19/2007 Document Revised: 07/08/2015 Document Reviewed: 11/22/2012 Elsevier Interactive Patient Education  2017 Elsevier Inc.  

## 2016-01-05 NOTE — Progress Notes (Signed)
Subjective:     Michele Gardner is a 30 y.o. female who presents for a postpartum visit. She is 5 weeks postpartum following a vaginal . I have fully reviewed the prenatal and intrapartum course. The delivery was at [redacted] week gestational weeks. Outcome: . Anesthesia: not at time. Postpartum course has been complicated. Baby's course has been uncomplicated. Baby is feeding by breast. Bleeding not at all. Bowel function is normal. Bladder function is normal . Patient is not  sexually active. Contraception method is none at this time but desires paraguard. Postpartum depression screening: negative   Complaining of headaches, frequently. Feels like tension headaches, with tightness in neck. Only trying Tylenol.   Review of Systems Pertinent items are noted in HPI.   Objective:    BP (!) 143/99 (BP Location: Left Arm, Cuff Size: Normal)   Pulse (!) 108   Wt 169 lb 3.2 oz (76.7 kg)   BMI 28.16 kg/m   General:  alert, cooperative, appears stated age and no distress   Breasts:  inspection negative, no nipple discharge or bleeding, no masses or nodularity palpable; Engorgement in left breast at 4 o'clock and 8 o'clock.   Lungs: clear to auscultation bilaterally  Heart:  regular rate and rhythm, S1, S2 normal, no murmur, click, rub or gallop  Abdomen: soft, non-tender; bowel sounds normal; no masses,  no organomegaly  Extremities: No edema  Neuro: Grossly normal, CN 2-12 grossly intact        Assessment:   5 week postpartum exam. Pap smear done 05/17/15 was negative, repeat in 3 years.   Plan:    1. Contraception: IUD - paraguard. Educated about protection for the next few weeks with condoms before returning for insertion.  2. Demonstrated milking the breast while feeding and warm compresses. 3. Trial with Flexeril for headaches if Motrin does not work 4. Starting on BP meds, Amlodipine 5mg , return in 3 weeks for recheck. 5. Follow up in: 3 weeks for BP recheck and Paraguard insertion or as  needed.

## 2016-01-26 ENCOUNTER — Encounter: Payer: Self-pay | Admitting: Medical

## 2016-01-26 ENCOUNTER — Ambulatory Visit (INDEPENDENT_AMBULATORY_CARE_PROVIDER_SITE_OTHER): Payer: Medicaid Other | Admitting: Medical

## 2016-01-26 VITALS — BP 137/92 | HR 73 | Wt 167.5 lb

## 2016-01-26 DIAGNOSIS — Z30430 Encounter for insertion of intrauterine contraceptive device: Secondary | ICD-10-CM

## 2016-01-26 DIAGNOSIS — Z3202 Encounter for pregnancy test, result negative: Secondary | ICD-10-CM

## 2016-01-26 LAB — POCT PREGNANCY, URINE: Preg Test, Ur: NEGATIVE

## 2016-01-26 MED ORDER — LEVONORGESTREL 18.6 MCG/DAY IU IUD
INTRAUTERINE_SYSTEM | Freq: Once | INTRAUTERINE | Status: AC
  Administered 2016-01-26: 1 via INTRAUTERINE

## 2016-01-26 NOTE — Progress Notes (Signed)
   GYNECOLOGY CLINIC PROCEDURE NOTE  [name] is a 30 y.o. [id number] here for Mirena IUD insertion. No GYN concerns.  Last pap smear was on 05/2015 and was normal.  IUD Insertion Procedure Note Patient identified, informed consent performed.  Discussed risks of irregular bleeding, cramping, infection, malpositioning or misplacement of the IUD outside the uterus which may require further procedure such as laparoscopy. Time out was performed.  Urine pregnancy test negative.  Speculum placed in the vagina.  Cervix visualized.  Cleaned with Betadine x 2.  Grasped anteriorly with a single tooth tenaculum.  Uterus sounded to 6 cm.  Mirena IUD placed per manufacturer's recommendations.  Strings trimmed to 3 cm. Tenaculum was removed, good hemostasis noted.  Patient tolerated procedure well.   Patient was given post-procedure instructions.  She was advised to be have backup contraception for one week.  Patient was also asked to check IUD strings periodically and follow up in 4 weeks for IUD check.  Patient also here for BP check.  BP (!) 137/92   Pulse 73   Wt 167 lb 8 oz (76 kg)   LMP 01/20/2016   Breastfeeding? Yes   BMI 27.87 kg/m  Currently on 5 mg Norvasc. Will change to Lisinopril and [facility]  Patient to return to [facility] in 1 week for BP check   Marny LowensteinJulie N Bowie Delia, PA-C 01/26/2016 11:55 AM

## 2016-01-26 NOTE — Patient Instructions (Signed)

## 2016-01-27 ENCOUNTER — Telehealth: Payer: Self-pay | Admitting: *Deleted

## 2016-01-27 NOTE — Telephone Encounter (Signed)
Attempted to schedule patient [facility]. The scheduler said there were no appointments for new patients at this time but that the patient could call on Monday Dec 18 to schedule a visit. I called patient and left a detailed voice mail stating that she should call [facility] on Monday, gave phone number to reach them. If she has any questions, call back at the clinic.

## 2016-01-28 ENCOUNTER — Other Ambulatory Visit: Payer: Self-pay | Admitting: Medical

## 2016-01-28 ENCOUNTER — Other Ambulatory Visit: Payer: Self-pay | Admitting: *Deleted

## 2016-01-28 DIAGNOSIS — I10 Essential (primary) hypertension: Secondary | ICD-10-CM

## 2016-01-28 DIAGNOSIS — O1003 Pre-existing essential hypertension complicating the puerperium: Secondary | ICD-10-CM

## 2016-01-28 MED ORDER — AMLODIPINE BESYLATE 5 MG PO TABS: 5.0000 mg | ORAL_TABLET | Freq: Every day | ORAL | 1 refills | Status: DC

## 2016-01-28 MED ORDER — LISINOPRIL 10 MG PO TABS: 10.0000 mg | ORAL_TABLET | Freq: Every day | ORAL | 1 refills | Status: DC

## 2016-02-23 ENCOUNTER — Ambulatory Visit: Payer: Medicaid Other | Admitting: Student

## 2016-08-25 ENCOUNTER — Encounter: Payer: Self-pay | Admitting: Family Medicine

## 2016-08-25 ENCOUNTER — Ambulatory Visit (INDEPENDENT_AMBULATORY_CARE_PROVIDER_SITE_OTHER): Payer: Medicaid Other | Admitting: Family Medicine

## 2016-08-25 VITALS — BP 142/90 | HR 74 | Temp 99.1°F | Wt 163.0 lb

## 2016-08-25 DIAGNOSIS — G43109 Migraine with aura, not intractable, without status migrainosus: Secondary | ICD-10-CM | POA: Insufficient documentation

## 2016-08-25 DIAGNOSIS — O0991 Supervision of high risk pregnancy, unspecified, first trimester: Secondary | ICD-10-CM

## 2016-08-25 DIAGNOSIS — G43909 Migraine, unspecified, not intractable, without status migrainosus: Secondary | ICD-10-CM | POA: Insufficient documentation

## 2016-08-25 DIAGNOSIS — I10 Essential (primary) hypertension: Secondary | ICD-10-CM | POA: Insufficient documentation

## 2016-08-25 DIAGNOSIS — G43709 Chronic migraine without aura, not intractable, without status migrainosus: Secondary | ICD-10-CM

## 2016-08-25 DIAGNOSIS — O1003 Pre-existing essential hypertension complicating the puerperium: Secondary | ICD-10-CM | POA: Insufficient documentation

## 2016-08-25 MED ORDER — AMLODIPINE BESYLATE 5 MG PO TABS: 5.0000 mg | ORAL_TABLET | Freq: Every day | ORAL | 1 refills | Status: DC

## 2016-08-25 MED ORDER — PRENATAL 27-0.8 MG PO TABS: 1.0000 | ORAL_TABLET | Freq: Every day | ORAL | 12 refills | Status: DC

## 2016-08-25 NOTE — Progress Notes (Signed)
     Subjective:   Michele Gardner is a 31 y.o. female with a history of migraines here for 9 month history of high blood pressure after the birth of her daughter.  She has taken Norvasc and Lisinopril in the past for her blood pressure issues.  She also has had migraines every day that are constant in nature and has taken tylenol and excedrin with some relief.  She is currently breastfeeding.  She has nausea, sensitivity to light/sound with migraines but denies vomiting.  Review of Systems:  Per HPI.   Social History: Former smoker  Objective:  BP (!) 142/90   Pulse 74   Temp 99.1 F (37.3 C) (Oral)   Wt 163 lb (73.9 kg)   SpO2 98%   BMI 27.12 kg/m   Gen:  31 y.o. female in NAD HEENT: NCAT, MMM, EOMI, anicteric sclerae CV: RRR, no MRG Resp: Non-labored, CTAB, no wheezes noted Ext: WWP, no edema MSK: Full ROM, strength intact, normal gait.  No edema.  Neuro: Alert and oriented, speech normal.  Optic field normal. Extraocular movements intact.  Hearing grossly intact bilaterally.  Tongue protrudes normally with no deviation.  Shoulder shrug, smile symmetric.  Assessment & Plan:     Michele Gardner is a 31 y.o. female here for   Benign essential hypertension, postpartum - DASH diet - work on weight loss - Reordered Norvasc 5 mg. - return in 2 weeks for BP check - return in 1 month for f/u   Migraine - Norvasc 5mg  - return in 1 month for f/u   Ellwood Denseumball, Alison, DO  PGY-1,  [facility] 08/25/2016  2:31 PM

## 2016-08-25 NOTE — Patient Instructions (Addendum)
It was great to see you!  For your blood pressure,  - work on decreasing salt intake - come into the clinic in 2 weeks for a blood pressure check - work on losing weight - take amlodipine as directed for blood pressure.  If you have any question, concerns, or side effects - don't hesitate to call the clinic.   Take care and seek immediate care sooner if you develop any concerns.   Thanks for letting us be a part of your care!  Ellwood DenseAlison Javan Gonzaga, DO [facility]

## 2016-08-25 NOTE — Assessment & Plan Note (Signed)
-   Norvasc 5mg  - return in 1 month for f/u

## 2016-08-25 NOTE — Assessment & Plan Note (Signed)
-   DASH diet - work on weight loss - Reordered Norvasc 5 mg. - return in 2 weeks for BP check - return in 1 month for f/u

## 2016-09-01 ENCOUNTER — Encounter: Payer: Self-pay | Admitting: Family Medicine

## 2016-09-01 ENCOUNTER — Ambulatory Visit: Payer: Medicaid Other | Admitting: Obstetrics & Gynecology

## 2016-09-08 ENCOUNTER — Other Ambulatory Visit: Payer: Self-pay | Admitting: *Deleted

## 2016-09-08 ENCOUNTER — Ambulatory Visit (INDEPENDENT_AMBULATORY_CARE_PROVIDER_SITE_OTHER): Payer: Medicaid Other | Admitting: *Deleted

## 2016-09-08 VITALS — BP 136/84 | HR 80

## 2016-09-08 DIAGNOSIS — R12 Heartburn: Secondary | ICD-10-CM

## 2016-09-08 DIAGNOSIS — Z0130 Encounter for examination of blood pressure without abnormal findings: Secondary | ICD-10-CM

## 2016-09-08 MED ORDER — RANITIDINE HCL 150 MG PO TABS: 150.0000 mg | ORAL_TABLET | Freq: Two times a day (BID) | ORAL | 1 refills | Status: DC | PRN

## 2016-09-08 NOTE — Progress Notes (Signed)
   Patient in nurse clinic for blood pressure check. Patient reported headache today, denies chest pain, dizziness, SOB, visual changes or n/v.  Patient stated she usually have a headache everyday and PCP is aware. Patient normally take Tylenol or Excedrin with some relief.  Patient is taking her blood pressure as prescribed and took prior to appointment this morning. Will forward to PCP.  Clovis PuMartin, Esperanza Madrazo L, RN   Vitals:   09/08/16 0943  BP: 136/84  Pulse: 80

## 2016-09-08 NOTE — Telephone Encounter (Signed)
Patient calling to request refill of:  Name of Medication(s):  Zantac  Last date of OV:  08/25/2016 Pharmacy:  [facility]  Will route refill request to Clinic RN.  Discussed with patient policy to call pharmacy for future refills.  Also, discussed refills may take up to 48 hours to approve or deny.  Nguyen Butler,  Kenedie Dirocco

## 2016-09-22 ENCOUNTER — Telehealth: Payer: Self-pay | Admitting: Family Medicine

## 2016-09-22 DIAGNOSIS — R12 Heartburn: Secondary | ICD-10-CM

## 2016-09-22 NOTE — Telephone Encounter (Signed)
Pt is calling because her Zantac was sent to the wrong pharmacy and they will not transfer her prescription to the other location. [facility] [address]  She would also like to speak to the doctor about her Migraines that she has started to have. jw

## 2016-09-23 MED ORDER — RANITIDINE HCL 150 MG PO TABS: 150.0000 mg | ORAL_TABLET | Freq: Two times a day (BID) | ORAL | 1 refills | Status: DC | PRN

## 2016-09-25 NOTE — Telephone Encounter (Signed)
Pt contacted and apt made for pcp 9/6.

## 2016-10-17 NOTE — Progress Notes (Deleted)
   Subjective:   Patient ID: Michele Gardner F Halle    DOB: 03/11/1985, 31 y.o. female   MRN: [id number]  Michele Gardner F Hoelzel is a 31 y.o. female with a history of hypertension, migraines here for follow up for migraines.***  *** Flu shot Was started on Norvasc in July. Still breastfeeding? Headache worsening or changing? More thorough HPI on migraines Neuro exam Could start BB for prophylaxis? First line acute therapy per UTD - tylenol combo therapy with 10 mg reglan/caffeine  Review of Systems:  Per HPI.   PMFSH: ***. Smoking status reviewed. Medications reviewed.  Objective:   There were no vitals taken for this visit. Vitals and nursing note reviewed.  General: well nourished, well developed, in no acute distress with non-toxic appearance HEENT: normocephalic, atraumatic, moist mucous membranes Neck: supple, non-tender without lymphadenopathy CV: regular rate and rhythm without murmurs, rubs, or gallops, no lower extremity edema Lungs: clear to auscultation bilaterally with normal work of breathing Abdomen: soft, non-tender, non-distended, no masses or organomegaly palpable, normoactive bowel sounds Skin: warm, dry, no rashes or lesions, cap refill < 2 seconds Extremities: warm and well perfused, normal tone MSK: Full ROM, strength and deep reflexes intact, normal gait.  No edema.  Neuro: Alert and oriented, speech normal.  Romberg negative.  Optic field normal. Extraocular movements intact.  Intact symmetric sensation to light touch of face bilaterally.  Hearing grossly intact bilaterally.  Tongue protrudes normally with no deviation.  Shoulder shrug, smile symmetric.  Assessment & Plan:   No problem-specific Assessment & Plan notes found for this encounter.  No orders of the defined types were placed in this encounter.  No orders of the defined types were placed in this encounter.   Ellwood DenseAlison Aava Deland, DO PGY-1, [facility] 10/17/2016 7:58 PM

## 2016-10-19 ENCOUNTER — Ambulatory Visit: Payer: Medicaid Other | Admitting: Family Medicine

## 2017-02-12 ENCOUNTER — Other Ambulatory Visit: Payer: Self-pay | Admitting: Family Medicine

## 2017-02-12 MED ORDER — LEVONORGESTREL 1.5 MG PO TABS: 1.5000 mg | ORAL_TABLET | Freq: Once | ORAL | 0 refills | Status: AC

## 2017-05-29 ENCOUNTER — Encounter: Payer: Self-pay | Admitting: *Deleted

## 2017-05-31 ENCOUNTER — Other Ambulatory Visit: Payer: Self-pay

## 2017-05-31 DIAGNOSIS — R12 Heartburn: Secondary | ICD-10-CM

## 2017-06-01 MED ORDER — RANITIDINE HCL 150 MG PO TABS: 150.0000 mg | ORAL_TABLET | Freq: Two times a day (BID) | ORAL | 1 refills | Status: DC | PRN

## 2017-07-27 ENCOUNTER — Encounter

## 2018-04-05 DIAGNOSIS — R079 Chest pain, unspecified: Secondary | ICD-10-CM | POA: Diagnosis not present

## 2018-04-05 DIAGNOSIS — R55 Syncope and collapse: Secondary | ICD-10-CM | POA: Diagnosis not present

## 2018-04-05 DIAGNOSIS — R509 Fever, unspecified: Secondary | ICD-10-CM | POA: Diagnosis not present

## 2018-06-21 DIAGNOSIS — H6090 Unspecified otitis externa, unspecified ear: Secondary | ICD-10-CM | POA: Diagnosis not present

## 2018-06-24 DIAGNOSIS — S01312A Laceration without foreign body of left ear, initial encounter: Secondary | ICD-10-CM | POA: Diagnosis not present

## 2018-12-02 ENCOUNTER — Other Ambulatory Visit: Payer: Self-pay

## 2018-12-02 ENCOUNTER — Ambulatory Visit (INDEPENDENT_AMBULATORY_CARE_PROVIDER_SITE_OTHER): Payer: Medicaid Other | Admitting: *Deleted

## 2018-12-02 DIAGNOSIS — Z111 Encounter for screening for respiratory tuberculosis: Secondary | ICD-10-CM | POA: Diagnosis not present

## 2018-12-02 NOTE — Progress Notes (Signed)
Patient is here for a PPD placement.  PPD placed in Left forearm @ 2:15pm.  Patient will return 12/05/18 @ 10am to have PPD read. Christen Bame, CMA

## 2018-12-05 ENCOUNTER — Other Ambulatory Visit: Payer: Self-pay

## 2018-12-05 ENCOUNTER — Ambulatory Visit (INDEPENDENT_AMBULATORY_CARE_PROVIDER_SITE_OTHER): Payer: Medicaid Other | Admitting: *Deleted

## 2018-12-05 DIAGNOSIS — Z111 Encounter for screening for respiratory tuberculosis: Secondary | ICD-10-CM

## 2018-12-05 LAB — TB SKIN TEST
Induration: 0 mm
TB Skin Test: NEGATIVE

## 2018-12-05 NOTE — Progress Notes (Signed)
Patient is here for a PPD read.  It was placed on 12/01/20 in the Left forearm @ 2:15pm am/pm.    PPD RESULTS:  Result: negative Induration: 0 mm  Letter created and given to patient for documentation purposes. Christen Bame, CMA

## 2019-02-15 NOTE — Progress Notes (Signed)
error 

## 2019-02-25 DIAGNOSIS — Z1152 Encounter for screening for COVID-19: Secondary | ICD-10-CM | POA: Diagnosis not present

## 2019-08-06 DIAGNOSIS — G43909 Migraine, unspecified, not intractable, without status migrainosus: Secondary | ICD-10-CM | POA: Diagnosis not present

## 2019-08-08 ENCOUNTER — Ambulatory Visit (INDEPENDENT_AMBULATORY_CARE_PROVIDER_SITE_OTHER): Payer: Medicaid Other | Admitting: Medical

## 2019-08-08 ENCOUNTER — Encounter: Payer: Self-pay | Admitting: Medical

## 2019-08-08 ENCOUNTER — Other Ambulatory Visit: Payer: Self-pay

## 2019-08-08 ENCOUNTER — Other Ambulatory Visit (HOSPITAL_COMMUNITY)
Admission: RE | Admit: 2019-08-08 | Discharge: 2019-08-08 | Disposition: A | Discharge status: Home / Self Care | Payer: Medicaid Other | Source: Ambulatory Visit | Attending: Medical | Admitting: Medical

## 2019-08-08 VITALS — BP 155/114 | HR 75 | Ht 66.0 in | Wt 164.1 lb

## 2019-08-08 DIAGNOSIS — Z01419 Encounter for gynecological examination (general) (routine) without abnormal findings: Secondary | ICD-10-CM | POA: Diagnosis not present

## 2019-08-08 DIAGNOSIS — Z3202 Encounter for pregnancy test, result negative: Secondary | ICD-10-CM

## 2019-08-08 DIAGNOSIS — G43709 Chronic migraine without aura, not intractable, without status migrainosus: Secondary | ICD-10-CM

## 2019-08-08 DIAGNOSIS — I10 Essential (primary) hypertension: Secondary | ICD-10-CM

## 2019-08-08 DIAGNOSIS — Z30433 Encounter for removal and reinsertion of intrauterine contraceptive device: Secondary | ICD-10-CM

## 2019-08-08 LAB — POCT PREGNANCY, URINE: Preg Test, Ur: NEGATIVE

## 2019-08-08 MED ORDER — LISINOPRIL 10 MG PO TABS: 10.0000 mg | ORAL_TABLET | Freq: Every day | ORAL | 1 refills | Status: DC

## 2019-08-08 NOTE — Patient Instructions (Signed)
IUD PLACEMENT POST-PROCEDURE INSTRUCTIONS  1. You may take Ibuprofen, Aleve or Tylenol for pain if needed.  Cramping should resolve within in 24 hours.  2. You may have a small amount of spotting.  You should wear a mini pad for the next few days.  3. You may have intercourse after 24 hours.  If you using this for birth control, it is effective immediately.  4. You need to call if you have any pelvic pain, fever, heavy bleeding or foul smelling vaginal discharge.  Irregular bleeding is common the first several months after having an IUD placed. You do not need to call for this reason unless you are concerned.  5. Shower or bathe as normal  You should have a follow-up appointment in 4-8 weeks for a re-check to make sure you are not having any problems.Hypertension, Adult Hypertension is another name for high blood pressure. High blood pressure forces your heart to work harder to pump blood. This can cause problems over time. There are two numbers in a blood pressure reading. There is a top number (systolic) over a bottom number (diastolic). It is best to have a blood pressure that is below 120/80. Healthy choices can help lower your blood pressure, or you may need medicine to help lower it. What are the causes? The cause of this condition is not known. Some conditions may be related to high blood pressure. What increases the risk?  Smoking.  Having type 2 diabetes mellitus, high cholesterol, or both.  Not getting enough exercise or physical activity.  Being overweight.  Having too much fat, sugar, calories, or salt (sodium) in your diet.  Drinking too much alcohol.  Having long-term (chronic) kidney disease.  Having a family history of high blood pressure.  Age. Risk increases with age.  Race. You may be at higher risk if you are African American.  Gender. Men are at higher risk than women before age 22. After age 45, women are at higher risk than men.  Having obstructive  sleep apnea.  Stress. What are the signs or symptoms?  High blood pressure may not cause symptoms. Very high blood pressure (hypertensive crisis) may cause: ? Headache. ? Feelings of worry or nervousness (anxiety). ? Shortness of breath. ? Nosebleed. ? A feeling of being sick to your stomach (nausea). ? Throwing up (vomiting). ? Changes in how you see. ? Very bad chest pain. ? Seizures. How is this treated?  This condition is treated by making healthy lifestyle changes, such as: ? Eating healthy foods. ? Exercising more. ? Drinking less alcohol.  Your health care provider may prescribe medicine if lifestyle changes are not enough to get your blood pressure under control, and if: ? Your top number is above 130. ? Your bottom number is above 80.  Your personal target blood pressure may vary. Follow these instructions at home: Eating and drinking   If told, follow the DASH eating plan. To follow this plan: ? Fill one half of your plate at each meal with fruits and vegetables. ? Fill one fourth of your plate at each meal with whole grains. Whole grains include whole-wheat pasta, brown rice, and whole-grain bread. ? Eat or drink low-fat dairy products, such as skim milk or low-fat yogurt. ? Fill one fourth of your plate at each meal with low-fat (lean) proteins. Low-fat proteins include fish, chicken without skin, eggs, beans, and tofu. ? Avoid fatty meat, cured and processed meat, or chicken with skin. ? Avoid pre-made or processed  food.  Eat less than 1,500 mg of salt each day.  Do not drink alcohol if: ? Your doctor tells you not to drink. ? You are pregnant, may be pregnant, or are planning to become pregnant.  If you drink alcohol: ? Limit how much you use to:  0-1 drink a day for women.  0-2 drinks a day for men. ? Be aware of how much alcohol is in your drink. In the [place] one drink equals one 12 oz bottle of beer (355 mL), one 5 oz glass of wine (148 mL), or one  1 oz glass of hard liquor (44 mL). Lifestyle   Work with your doctor to stay at a healthy weight or to lose weight. Ask your doctor what the best weight is for you.  Get at least 30 minutes of exercise most days of the week. This may include walking, swimming, or biking.  Get at least 30 minutes of exercise that strengthens your muscles (resistance exercise) at least 3 days a week. This may include lifting weights or doing Pilates.  Do not use any products that contain nicotine or tobacco, such as cigarettes, e-cigarettes, and chewing tobacco. If you need help quitting, ask your doctor.  Check your blood pressure at home as told by your doctor.  Keep all follow-up visits as told by your doctor. This is important. Medicines  Take over-the-counter and prescription medicines only as told by your doctor. Follow directions carefully.  Do not skip doses of blood pressure medicine. The medicine does not work as well if you skip doses. Skipping doses also puts you at risk for problems.  Ask your doctor about side effects or reactions to medicines that you should watch for. Contact a doctor if you:  Think you are having a reaction to the medicine you are taking.  Have headaches that keep coming back (recurring).  Feel dizzy.  Have swelling in your ankles.  Have trouble with your vision. Get help right away if you:  Get a very bad headache.  Start to feel mixed up (confused).  Feel weak or numb.  Feel faint.  Have very bad pain in your: ? Chest. ? Belly (abdomen).  Throw up more than once.  Have trouble breathing. Summary  Hypertension is another name for high blood pressure.  High blood pressure forces your heart to work harder to pump blood.  For most people, a normal blood pressure is less than 120/80.  Making healthy choices can help lower blood pressure. If your blood pressure does not get lower with healthy choices, you may need to take medicine. This  information is not intended to replace advice given to you by your health care provider. Make sure you discuss any questions you have with your health care provider. Document Revised: 10/10/2017 Document Reviewed: 10/10/2017 Elsevier Patient Education  2020 [employer].

## 2019-08-08 NOTE — Progress Notes (Signed)
  History:  Michele Gardner is a 34 y.o. [id number] who presents to clinic today for removal and reinsertion of her Liletta IUD. Would like to switch to Paraguard because of hormone exposure.   The following portions of the patient's history were reviewed and updated as appropriate: allergies, current medications, family history, past medical history, social history, past surgical history and problem list.  Review of Systems:  Review of Systems  Constitutional: Negative.   HENT: Negative.   Eyes: Negative.  Negative for blurred vision and photophobia.  Respiratory: Negative.   Cardiovascular: Negative.  Negative for palpitations.  Gastrointestinal: Negative.   Genitourinary: Negative.   Musculoskeletal: Negative.   Skin: Negative.   Neurological: Positive for headaches (had a severe headache earlier in the week, still has "pressure").  Endo/Heme/Allergies: Negative.   Psychiatric/Behavioral: Negative.      Objective:  Physical Exam BP (!) 155/114   Pulse 75   Ht 5\' 6"  (1.676 m)   Wt 164 lb 1.6 oz (74.4 kg)   BMI 26.49 kg/m  Physical Exam  Constitutional: She is oriented to person, place, and time.  HENT:  Head: Normocephalic.  Nose: Nose normal.  Mouth/Throat: Mucous membranes are moist.  Cardiovascular: Normal rate and regular rhythm.  Respiratory: Effort normal.  GI: Normal appearance.  Genitourinary:    Vulva, cervix, right adnexa, left adnexa and rectum normal.     Pelvic exam was performed with patient in the lithotomy position.     Vaginal discharge (normal white, non odorous) present.     No vaginal tenderness or bleeding.  No tenderness or bleeding in the vagina.  Musculoskeletal:        General: Normal range of motion.  Neurological: She is alert and oriented to person, place, and time.  Skin: Skin is warm and dry.  Psychiatric: Her behavior is normal. Mood, judgment and thought content normal.   Labs and Imaging Results for orders placed or performed in  visit on 08/08/19 (from the past 24 hour(s))  Pregnancy, urine POC     Status: None   Collection Time: 08/08/19  8:41 AM  Result Value Ref Range   Preg Test, Ur NEGATIVE NEGATIVE   Assessment & Plan:  1. Essential hypertension Pt not taking Norvasc as listed on med rec, says she was told to stop taking it because of "cancer risk". Advised she needs to be treated for HTN, discussed lifestyle changes and recommend close F/U with PCP. - lisinopril (ZESTRIL) 10 MG tablet; Take 1 tablet (10 mg total) by mouth daily.  Dispense: 30 tablet; Refill: 1  2. Gynecologic exam normal Discussed bleeding profile of Paraguard vs Liletta, explained that hormone exposure with 08/10/19 is local and rarely systemic. HA are likely due to HTN. Pt decided to keep Liletta.  - Cytology - PAP( [place])  Approximately 20 minutes of face-to-face time was spent with this patient   Michele Gardner, CNM 08/08/2019 9:15 AM

## 2019-08-11 LAB — CYTOLOGY - PAP
Comment: NEGATIVE
Diagnosis: NEGATIVE
High risk HPV: NEGATIVE

## 2019-09-12 ENCOUNTER — Ambulatory Visit: Payer: Medicaid Other | Admitting: Medical

## 2019-09-27 ENCOUNTER — Other Ambulatory Visit: Payer: Self-pay | Admitting: Medical

## 2019-09-27 DIAGNOSIS — I10 Essential (primary) hypertension: Secondary | ICD-10-CM

## 2019-10-17 ENCOUNTER — Other Ambulatory Visit: Payer: Self-pay

## 2019-10-17 ENCOUNTER — Ambulatory Visit (INDEPENDENT_AMBULATORY_CARE_PROVIDER_SITE_OTHER): Payer: Medicaid Other | Admitting: Family Medicine

## 2019-10-17 ENCOUNTER — Telehealth: Payer: Self-pay | Admitting: Family Medicine

## 2019-10-17 ENCOUNTER — Encounter: Payer: Self-pay | Admitting: Family Medicine

## 2019-10-17 VITALS — BP 132/86 | HR 82 | Wt 166.2 lb

## 2019-10-17 DIAGNOSIS — I10 Essential (primary) hypertension: Secondary | ICD-10-CM

## 2019-10-17 DIAGNOSIS — K219 Gastro-esophageal reflux disease without esophagitis: Secondary | ICD-10-CM | POA: Diagnosis not present

## 2019-10-17 MED ORDER — HYDROCHLOROTHIAZIDE 12.5 MG PO CAPS: 12.5000 mg | ORAL_CAPSULE | Freq: Every day | ORAL | 1 refills | Status: DC

## 2019-10-17 MED ORDER — LISINOPRIL 10 MG PO TABS: 10.0000 mg | ORAL_TABLET | Freq: Every day | ORAL | 1 refills | Status: DC

## 2019-10-17 MED ORDER — HYDROCHLOROTHIAZIDE 25 MG PO TABS: 25.0000 mg | ORAL_TABLET | Freq: Every day | ORAL | 3 refills | Status: DC

## 2019-10-17 MED ORDER — OMEPRAZOLE 20 MG PO CPDR: 20.0000 mg | DELAYED_RELEASE_CAPSULE | Freq: Every day | ORAL | 3 refills | Status: DC

## 2019-10-17 NOTE — Progress Notes (Signed)
° ° °  SUBJECTIVE:   CHIEF COMPLAINT / HPI:   HTN Was recently seen at [facility] center for IUD removal and insertion. Her blood pressure was elevated during this encounter and was started on lisinopril. She has been out for the last few days and would like a refill. Denies headaches, chest pain, and vision changes.    Heartburn Continues to have heartburn described as a fiery feeling in the center of her chest after she eats. This has been intermittent since being pregnant with her last child. Would like to try some medication for relief. Denies abdominal pain, nausea, and vomiting  PERTINENT  PMH / PSH: Liletta IUD (reinsertion 08/08/2019), Migraines  OBJECTIVE:   BP 132/86    Pulse 82    Wt 166 lb 3.2 oz (75.4 kg)    SpO2 98%    BMI 26.83 kg/m   General: Appears well, no acute distress. Age appropriate. Cardiac: RRR, normal heart sounds, no murmurs Respiratory: CTAB, normal effort  ASSESSMENT/PLAN:   Hypertension Chronic. Stable. Hx of postpartum HTN. Previously taking lisinopril 10 mg daily. In case patient desire pregnancy in the future will switch to HCTZ.  -Discontinue lisinopril -Start HCTZ 12.5 mg daily -F/u in 2 weeks for BP check  Gastroesophageal reflux disease Appears to be chronic and intermittent for 4 years since her last child.  -Start omeprazole 20 mg daily -Follow up as needed if symptoms do not resolve.    Michele Jumbo, DO [facility]

## 2019-10-17 NOTE — Patient Instructions (Addendum)
Today you were seen for your blood pressure.  It is a little elevated.  Likely due to to not taking medication for couple days.  I have prescribed lisinopril 10 mg.  Please to pick this up today and start taking.  Follow-up in 2 weeks for BP recheck  I have also prescribed omeprazole 20 mg daily for heartburn.  If this does not improve you can increase to 40 mg daily.  We can follow-up on this at your next visit.  Have a wonderful weekend.  Dr. Salvadore Dom

## 2019-10-17 NOTE — Telephone Encounter (Signed)
Called patient and pharmacy to discontinue lisinopril and start HCTZ 12.5mg  daily. Both parties aware.   Marnae Madani Autry-Lott, DO 10/17/2019, 12:17 PM PGY-2, [facility]

## 2019-10-23 DIAGNOSIS — K219 Gastro-esophageal reflux disease without esophagitis: Secondary | ICD-10-CM | POA: Insufficient documentation

## 2019-10-23 NOTE — Assessment & Plan Note (Signed)
Appears to be chronic and intermittent for 4 years since her last child.  -Start omeprazole 20 mg daily -Follow up as needed if symptoms do not resolve.

## 2019-10-23 NOTE — Assessment & Plan Note (Signed)
Chronic. Stable. Hx of postpartum HTN. Previously taking lisinopril 10 mg daily. In case patient desire pregnancy in the future will switch to HCTZ.  -Discontinue lisinopril -Start HCTZ 12.5 mg daily -F/u in 2 weeks for BP check

## 2019-12-26 ENCOUNTER — Other Ambulatory Visit: Payer: Self-pay | Admitting: Family Medicine

## 2019-12-26 DIAGNOSIS — I10 Essential (primary) hypertension: Secondary | ICD-10-CM

## 2020-03-01 ENCOUNTER — Encounter: Payer: Self-pay | Admitting: *Deleted

## 2020-03-01 ENCOUNTER — Telehealth: Payer: Self-pay | Admitting: *Deleted

## 2020-03-01 NOTE — Telephone Encounter (Signed)
Tried to reach patient via phone.  Left voicemail message and sent My Chart message.  Due to inclement weather office not opening until 10 am 03/02/20 patient appointment scheduled for 0835 am.  Appointment cancelled.  Encouraged patient to call the office tomorrow after 10 am to reschedule.  Sent a message to the Admin pool to call patient to reschedule is patient doesn't call the office.

## 2020-03-02 ENCOUNTER — Ambulatory Visit: Payer: Medicaid Other | Admitting: Advanced Practice Midwife

## 2020-03-12 ENCOUNTER — Telehealth: Payer: Self-pay

## 2020-03-12 NOTE — Telephone Encounter (Signed)
Patient calls nurse line regarding concerns for elevated BP. Patient also states that she has been having tingling and pain in feet and legs. Patient has questions about if pain could be restless leg syndrome. Patient does report that symptoms improve when taking BP medication (HCTZ). Patient states that she has not taken medication today or yesterday, due to waiting on refill from pharmacy. Advised patient when picking up refill to check BP on pharmacy machine and to return call to office if numbers were elevated.   Patient also reported "slight chest discomfort" over the last two days. Patient is not currently having chest pain or difficulty breathing. Offered to schedule patient same day appointment for further evaluation. Patient declined due to work and school schedule. Patient requested appointment for Monday morning. Scheduled with Dr. Larita Fife to address BP concerns.   Patient is also requesting referral to Neurologist for long history of migraines. Advised patient that she would likely need a separate appointment to discuss with PCP, as Monday's appointment will be centered around BP concern.   Scheduled with PCP on Wednesday 2/2.   Provided patient with strict ED precautions.

## 2020-03-15 ENCOUNTER — Ambulatory Visit: Payer: Medicaid Other | Admitting: Family Medicine

## 2020-03-16 NOTE — Progress Notes (Signed)
° ° °  SUBJECTIVE:   CHIEF COMPLAINT / HPI:   Hypertension: Patient is a 35 year old female presents today for follow-up on her hypertension.  Current medications include hydrochlorothiazide 12.5 mg daily.    History of migraines: Patient states that she has had ongoing migraines for some time.  She request a referral for neurology for these.  She does not have any current migraine medications at this time.  She states that at this time she has a migraine almost daily. She gets these sometimes first thing in the morning and sometimes later in the day. She gets blurry vision, feels nauseated, and has significant headache on one side or the other, she will often have photophobia with the headaches. She has been taking Excendin migraine almost daily for this. She has not tried any other medication to prevent them in the past.   She is on the IUD for contraception.  PERTINENT  PMH / PSH: History of migraines  OBJECTIVE:   BP (!) 145/82    Pulse 92    Ht 5\' 6"  (1.676 m)    Wt 166 lb 9.6 oz (75.6 kg)    SpO2 99%    BMI 26.89 kg/m    General: NAD, pleasant, able to participate in exam Cardiac: RRR, no murmurs. Respiratory: CTAB, normal effort, No wheezes, rales or rhonchi Neuro: alert, no obvious focal deficits, CN II through XII intact Psych: Normal affect and mood  ASSESSMENT/PLAN:   Migraine Assessment: 35 year old female with ongoing history of migraine headaches.  These headaches have been occurring almost every day and are associated with nausea, photophobia, and are unilateral with a "pulsating" feel.  The laterality of the headache seems to vary day today.  Patient has been using Excedrin Migraine for these daily and this does seem to abort the headache within an hour or so.  Patient was initially requesting a referral to neurology but has not yet attempted any prophylactic medication for them.  Normal neurologic exam and encounter today.  Overall differential is consistent with migraine  though it is better than usual to have these almost every day.  We will attempt a trial of prophylactic medication to see how the patient does. Plan: -We will initiate propranolol ER 60 mg/day for prophylaxis -We will recommend patient keep a headache diary follows up in 1 month -We will have patient continue Excedrin Migraine daily when getting a headache as this seems to be working well for her.  Hypertension Blood pressure remains a little elevated today at 145/82.  Due to patient's complaint of headache at this time and ongoing complaints of migraines this may be a product of or a cause of her headaches throughout the day.  Since we are initiating propranolol for migraine prophylaxis we will hold off on increasing her blood pressure medication at this time time.  Patient plans to do a follow-up appointment in the next 3 to 4 weeks and at that time we can consider increasing her hydrochlorothiazide to 25 mg/day.     36, DO [facility]    This note was prepared using Dragon voice recognition software and may include unintentional dictation errors due to the inherent limitations of voice recognition software.

## 2020-03-16 NOTE — Patient Instructions (Signed)
It was great to see you! Thank you for allowing me to participate in your care!  Our plans for today:  -We are starting a medication for your migraines which I would like you to take each day.  This is called propranolol and should help prevent them within the next few weeks. -I would like you to keep a headache journal to document how often you have them and any other associated symptoms -I like to see back in 1 month -Continue to use Excedrin Migraine as needed when you have headaches.  Take care and seek immediate care sooner if you develop any concerns.   Dr. Jackelyn Poling, DO [facility]

## 2020-03-17 ENCOUNTER — Ambulatory Visit (INDEPENDENT_AMBULATORY_CARE_PROVIDER_SITE_OTHER): Payer: Medicaid Other | Admitting: Family Medicine

## 2020-03-17 ENCOUNTER — Encounter: Payer: Self-pay | Admitting: Family Medicine

## 2020-03-17 ENCOUNTER — Other Ambulatory Visit: Payer: Self-pay

## 2020-03-17 DIAGNOSIS — I10 Essential (primary) hypertension: Secondary | ICD-10-CM

## 2020-03-17 DIAGNOSIS — G43709 Chronic migraine without aura, not intractable, without status migrainosus: Secondary | ICD-10-CM

## 2020-03-17 MED ORDER — PROPRANOLOL HCL ER 60 MG PO CP24: 60.0000 mg | ORAL_CAPSULE | Freq: Every day | ORAL | 1 refills | Status: DC

## 2020-03-17 NOTE — Assessment & Plan Note (Signed)
Blood pressure remains a little elevated today at 145/82.  Due to patient's complaint of headache at this time and ongoing complaints of migraines this may be a product of or a cause of her headaches throughout the day.  Since we are initiating propranolol for migraine prophylaxis we will hold off on increasing her blood pressure medication at this time time.  Patient plans to do a follow-up appointment in the next 3 to 4 weeks and at that time we can consider increasing her hydrochlorothiazide to 25 mg/day.

## 2020-03-17 NOTE — Assessment & Plan Note (Signed)
Assessment: 35 year old female with ongoing history of migraine headaches.  These headaches have been occurring almost every day and are associated with nausea, photophobia, and are unilateral with a "pulsating" feel.  The laterality of the headache seems to vary day today.  Patient has been using Excedrin Migraine for these daily and this does seem to abort the headache within an hour or so.  Patient was initially requesting a referral to neurology but has not yet attempted any prophylactic medication for them.  Normal neurologic exam and encounter today.  Overall differential is consistent with migraine though it is better than usual to have these almost every day.  We will attempt a trial of prophylactic medication to see how the patient does. Plan: -We will initiate propranolol ER 60 mg/day for prophylaxis -We will recommend patient keep a headache diary follows up in 1 month -We will have patient continue Excedrin Migraine daily when getting a headache as this seems to be working well for her.

## 2020-03-23 ENCOUNTER — Ambulatory Visit: Payer: Medicaid Other | Admitting: Certified Nurse Midwife

## 2020-04-02 ENCOUNTER — Other Ambulatory Visit: Payer: Self-pay | Admitting: Family Medicine

## 2020-04-02 DIAGNOSIS — I10 Essential (primary) hypertension: Secondary | ICD-10-CM

## 2020-04-15 NOTE — Progress Notes (Deleted)
    SUBJECTIVE:   CHIEF COMPLAINT / HPI:   Migraines: Patient is a 35 year old female that presents today for follow-up to discuss migraines.  At our previous appointment we initiated propranolol extended release 60 mg/day for prophylaxis.  Patient also plan to keep a headache diary for the next 1 month.  Today she states***.  Hypertension: At last appointment patient's blood pressure was also elevated at 145/82, though she was complaining of a headache at the time which may have increased her blood pressure numbers.  We did not make a modification to her blood pressure medication as we initiate propranolol for migraine prophylaxis.  Patient plan to follow-up today for a blood pressure recheck at which point we could consider increasing her hydrochlorothiazide to 25 mg/day.  Today she states***.  PERTINENT  PMH / PSH: ***  OBJECTIVE:   There were no vitals taken for this visit.   General: NAD, pleasant, able to participate in exam Cardiac: RRR, no murmurs. Respiratory: CTAB, normal effort Extremities: no edema or cyanosis. Skin: warm and dry, no rashes noted Neuro: alert, no obvious focal deficits Psych: Normal affect and mood  ASSESSMENT/PLAN:   No problem-specific Assessment & Plan notes found for this encounter.     Jackelyn Poling, DO [facility]    This note was prepared using Dragon voice recognition software and may include unintentional dictation errors due to the inherent limitations of voice recognition software.

## 2020-04-16 ENCOUNTER — Ambulatory Visit: Payer: Medicaid Other | Admitting: Family Medicine

## 2020-07-07 NOTE — Progress Notes (Deleted)
    SUBJECTIVE:   CHIEF COMPLAINT / HPI:   Face Rash  Current medications include: HCTZ and inderal  HTN  Elevated at last visit. Thought to be due to headaches and no change sin medication at that time. Patient no showed to follow up. Today BP is *** .  PERTINENT  PMH / PSH: ***  OBJECTIVE:   There were no vitals taken for this visit.  ***  ASSESSMENT/PLAN:   No problem-specific Assessment & Plan notes found for this encounter.     Melene Plan, MD [facility]   {    This will disappear when note is signed, click to select method of visit    :1}

## 2020-07-08 ENCOUNTER — Ambulatory Visit: Payer: Medicaid Other

## 2020-07-10 ENCOUNTER — Other Ambulatory Visit: Payer: Self-pay | Admitting: Family Medicine

## 2020-07-10 DIAGNOSIS — I10 Essential (primary) hypertension: Secondary | ICD-10-CM

## 2020-08-19 ENCOUNTER — Other Ambulatory Visit: Payer: Self-pay

## 2020-08-19 ENCOUNTER — Ambulatory Visit (INDEPENDENT_AMBULATORY_CARE_PROVIDER_SITE_OTHER): Payer: Medicaid Other | Admitting: Family Medicine

## 2020-08-19 ENCOUNTER — Ambulatory Visit (HOSPITAL_COMMUNITY)
Admission: RE | Admit: 2020-08-19 | Discharge: 2020-08-19 | Disposition: A | Discharge status: Home / Self Care | Payer: Medicaid Other | Source: Ambulatory Visit | Attending: Family Medicine | Admitting: Family Medicine

## 2020-08-19 VITALS — BP 149/101 | HR 97

## 2020-08-19 DIAGNOSIS — J029 Acute pharyngitis, unspecified: Secondary | ICD-10-CM | POA: Insufficient documentation

## 2020-08-19 DIAGNOSIS — I10 Essential (primary) hypertension: Secondary | ICD-10-CM | POA: Diagnosis not present

## 2020-08-19 DIAGNOSIS — R059 Cough, unspecified: Secondary | ICD-10-CM | POA: Diagnosis not present

## 2020-08-19 NOTE — Progress Notes (Signed)
    SUBJECTIVE:   CHIEF COMPLAINT / HPI:   Sore throat Throat pain x1 day and feeling like lungs are hurting and heavy. Has had mucus production that was green. Hurts to take a deep breath. Denies fever, vomiting. Recently quit smoking 3 weeks ago. Hx of recurrent viral infections and strep throat. At home COVID test was negative. Would like to be test again today.   Hypertension: - Medications: HCTZ 12.5 mg daily - Compliance: No, did not take this morning - Denies any SOB, CP, vision changes, LE edema, medication SEs, or symptoms of hypotension  PERTINENT  PMH / PSH: Migraines, GERD  OBJECTIVE:   BP (!) 149/101   Pulse 97   SpO2 100%   General: Appears well, no acute distress. Age appropriate. Cardiac: RRR, normal heart sounds, no murmurs Respiratory: Upper lung fields sound clear to auscultation. Lower lung fields appreciate a faint rub. No wheezing. Normal effort.  ASSESSMENT/PLAN:   1. Sore throat 1 day of illness. Symptoms appear to be viral. Somewhat concerning lung ausculation and known smoker. Will obtain CXR to rule out possible pneumonia otherwise likely viral illness that is self limiting. Discussed supportive care and follow up if no improvement within 7-10 days. - DG Chest 2 View; Future - Novel Coronavirus, NAA ([facility])  2. Benign essential hypertension -Continue medications -ED precautions given -Follow up with PCP    Lavonda Jumbo, DO [facility]

## 2020-08-19 NOTE — Patient Instructions (Addendum)
Go to [facility] to get chest x-ray.  I will call you with the results to COVID test.  In the meantime you can use honey for cough and Tylenol or ibuprofen for pain.  As discussed please go to ED if you have shortness of breath increased chest pain.  Please take your blood pressure medication.  Follow-up in 1 to 2 weeks if symptoms do not get better.   Dr. Salvadore Dom

## 2020-08-20 ENCOUNTER — Telehealth: Payer: Self-pay | Admitting: Family Medicine

## 2020-08-20 ENCOUNTER — Telehealth: Payer: Self-pay

## 2020-08-20 LAB — NOVEL CORONAVIRUS, NAA: SARS-CoV-2, NAA: NOT DETECTED

## 2020-08-20 LAB — SARS-COV-2, NAA 2 DAY TAT

## 2020-08-20 NOTE — Telephone Encounter (Signed)
Discussed negative covid and chest xray. Supportive care measures. If viral will get better with time. If not feeling better by end of next week follow up.   Lavonda Jumbo, DO 08/20/2020, 5:25 PM PGY-3, [facility]

## 2020-08-20 NOTE — Telephone Encounter (Signed)
Patient calls nurse line regarding results from Chest XR. Advised of normal results. Patient requesting returned call from provider to discuss next steps.   Veronda Prude, RN

## 2020-09-15 DIAGNOSIS — L720 Epidermal cyst: Secondary | ICD-10-CM | POA: Diagnosis not present

## 2020-09-22 ENCOUNTER — Ambulatory Visit: Payer: Medicaid Other

## 2020-10-02 ENCOUNTER — Other Ambulatory Visit: Payer: Self-pay | Admitting: Family Medicine

## 2020-10-02 DIAGNOSIS — I10 Essential (primary) hypertension: Secondary | ICD-10-CM

## 2020-10-11 ENCOUNTER — Ambulatory Visit (HOSPITAL_COMMUNITY)
Admission: EM | Admit: 2020-10-11 | Discharge: 2020-10-11 | Discharge status: Against Medical Advice | Payer: Medicaid Other

## 2020-10-11 ENCOUNTER — Other Ambulatory Visit: Payer: Self-pay

## 2020-10-11 ENCOUNTER — Telehealth: Payer: Self-pay

## 2020-10-11 NOTE — Telephone Encounter (Signed)
Patient calls nurse line reporting headaches and blurry vision. Patient reports she took her blood pressure at work 159/109. Patient reports she feels "funny" very fatigued. Patient reports she takes hydrochlorothiazide daily, however stopped taking propanolol due to giving her migraines. Patient advised to go to UC to be evaluated. Patient agreed with plan. Patient scheduled for next available in office to followup.

## 2020-10-12 ENCOUNTER — Other Ambulatory Visit: Payer: Self-pay

## 2020-10-12 ENCOUNTER — Encounter (HOSPITAL_BASED_OUTPATIENT_CLINIC_OR_DEPARTMENT_OTHER): Payer: Self-pay | Admitting: Emergency Medicine

## 2020-10-12 ENCOUNTER — Emergency Department (HOSPITAL_BASED_OUTPATIENT_CLINIC_OR_DEPARTMENT_OTHER): Payer: Medicaid Other

## 2020-10-12 ENCOUNTER — Emergency Department (HOSPITAL_BASED_OUTPATIENT_CLINIC_OR_DEPARTMENT_OTHER)
Admission: EM | Admit: 2020-10-12 | Discharge: 2020-10-12 | Disposition: A | Discharge status: Home / Self Care | Payer: Medicaid Other | Attending: Emergency Medicine | Admitting: Emergency Medicine

## 2020-10-12 DIAGNOSIS — G43109 Migraine with aura, not intractable, without status migrainosus: Secondary | ICD-10-CM | POA: Diagnosis not present

## 2020-10-12 DIAGNOSIS — Z87891 Personal history of nicotine dependence: Secondary | ICD-10-CM | POA: Insufficient documentation

## 2020-10-12 DIAGNOSIS — J45909 Unspecified asthma, uncomplicated: Secondary | ICD-10-CM | POA: Insufficient documentation

## 2020-10-12 DIAGNOSIS — G43001 Migraine without aura, not intractable, with status migrainosus: Secondary | ICD-10-CM

## 2020-10-12 DIAGNOSIS — Z79899 Other long term (current) drug therapy: Secondary | ICD-10-CM | POA: Diagnosis not present

## 2020-10-12 DIAGNOSIS — I10 Essential (primary) hypertension: Secondary | ICD-10-CM | POA: Diagnosis not present

## 2020-10-12 DIAGNOSIS — G43901 Migraine, unspecified, not intractable, with status migrainosus: Secondary | ICD-10-CM | POA: Insufficient documentation

## 2020-10-12 DIAGNOSIS — R079 Chest pain, unspecified: Secondary | ICD-10-CM | POA: Diagnosis not present

## 2020-10-12 LAB — BASIC METABOLIC PANEL
Anion gap: 11 (ref 5–15)
BUN: 10 mg/dL (ref 6–20)
CO2: 25 mmol/L (ref 22–32)
Calcium: 9.4 mg/dL (ref 8.9–10.3)
Chloride: 103 mmol/L (ref 98–111)
Creatinine, Ser: 0.79 mg/dL (ref 0.44–1.00)
GFR, Estimated: >60 mL/min (ref >60)
Glucose, Bld: 108 mg/dL — ABNORMAL HIGH (ref 70–99)
Potassium: 4 mmol/L (ref 3.5–5.1)
Sodium: 139 mmol/L (ref 135–145)

## 2020-10-12 LAB — CBC
HCT: 38.7 % (ref 36.0–46.0)
Hemoglobin: 13.3 g/dL (ref 12.0–15.0)
MCH: 30.4 pg (ref 26.0–34.0)
MCHC: 34.4 g/dL (ref 30.0–36.0)
MCV: 88.6 fL (ref 80.0–100.0)
Platelets: 246 10*3/uL (ref 150–400)
RBC: 4.37 MIL/uL (ref 3.87–5.11)
RDW: 12.3 % (ref 11.5–15.5)
WBC: 7.5 10*3/uL (ref 4.0–10.5)
nRBC: 0 % (ref 0.0–0.2)

## 2020-10-12 LAB — TROPONIN I (HIGH SENSITIVITY): Troponin I (High Sensitivity): <2 ng/L (ref <18)

## 2020-10-12 LAB — PREGNANCY, URINE: Preg Test, Ur: NEGATIVE

## 2020-10-12 LAB — TSH: TSH: 0.251 u[IU]/mL — ABNORMAL LOW (ref 0.350–4.500)

## 2020-10-12 MED ORDER — SUMATRIPTAN SUCCINATE 50 MG PO TABS: 50.0000 mg | ORAL_TABLET | ORAL | 0 refills | Status: DC | PRN

## 2020-10-12 MED ORDER — KETOROLAC TROMETHAMINE 30 MG/ML IJ SOLN
30.0000 mg | Freq: Once | INTRAMUSCULAR | Status: AC
  Administered 2020-10-12: 30 mg via INTRAVENOUS
  Filled 2020-10-12: qty 1

## 2020-10-12 MED ORDER — SODIUM CHLORIDE 0.9 % IV BOLUS
1000.0000 mL | Freq: Once | INTRAVENOUS | Status: AC
  Administered 2020-10-12: 1000 mL via INTRAVENOUS

## 2020-10-12 MED ORDER — ONDANSETRON HCL 4 MG/2ML IJ SOLN
4.0000 mg | Freq: Once | INTRAMUSCULAR | Status: AC
  Administered 2020-10-12: 4 mg via INTRAVENOUS
  Filled 2020-10-12: qty 2

## 2020-10-12 MED ORDER — MAGNESIUM SULFATE 2 GM/50ML IV SOLN
2.0000 g | Freq: Once | INTRAVENOUS | Status: AC
  Administered 2020-10-12: 2 g via INTRAVENOUS
  Filled 2020-10-12: qty 50

## 2020-10-12 MED ORDER — ONDANSETRON 4 MG PO TBDP
4.0000 mg | ORAL_TABLET | Freq: Three times a day (TID) | ORAL | 0 refills | Status: DC | PRN
Start: 2020-10-12 — End: 2020-10-13

## 2020-10-12 NOTE — ED Provider Notes (Signed)
Medical screening examination/treatment/procedure(s) were conducted as a shared visit with non-physician practitioner(s) and myself.  I personally evaluated the patient during the encounter.  Clinical Impression:   Final diagnoses:  Migraine without aura and with status migrainosus, not intractable  Primary hypertension    Pt has htn, has migraines - has had fatigue and some weight gain - currently with her "typical migraine" - exam unremarkable =- VS with mild htn, rechecked at 139/90.  Fluids and ha cocktail - anticipate d/c.  Doubt sx are from  htn.   Eber Hong, MD 10/23/20 [phone]

## 2020-10-12 NOTE — Discharge Instructions (Addendum)
Your lab work and imaging today were reassuring.  Your EKG was normal. Your blood pressures today have been slightly elevated, but this is not something I believe you need to be hospitalized for. We also evaluated your thyroid levels. The results are still pending but you can follow up with your primary care provider to discuss the results.   We gave you medications for your migraine.  Continue good fluid intake, I am writing a prescription for Sumatriptan for migraines, you can take 1 tablet and then again 2 hours later if symptoms persist. Do not exceed two tablets in 24 hours.   I am also writing you a script for Zofran which is an antinausea medication that you can take as needed.    I would advise following up with a primary care provider for blood pressure management.  I would recommend discussing migraine management with them as well. Continue to monitor your symptoms and return to the ED for new or worsening headache or chest pain.

## 2020-10-12 NOTE — ED Provider Notes (Signed)
[facility] EMERGENCY DEPARTMENT Provider Note   CSN: [id number] Arrival date & time: 10/12/20  1111     History Chief Complaint  Patient presents with   Hypertension    Michele Gardner is a 35 y.o. female with history of migraines and hypertension who presents with migraine for the last 5 days, as well as right sided chest tightness that radiates to the back that has been going on for 2 days.  She has been taking Excedrin without relief.  Patient has a history of hypertension, she takes hydrochlorothiazide daily.  She states when she has migraines she often has an increase in her blood pressure.  She checks this level at home and her blood pressure has been running in the 150s systolic range.  Also endorses subjective weight gain, has never had her thyroid evaluated before.  She does not take oral contraceptives, denies recent travel.    Hypertension Associated symptoms include chest pain and headaches. Pertinent negatives include no shortness of breath.      Past Medical History:  Diagnosis Date   Asthma    Hypertension    Medical history non-contributory     Patient Active Problem List   Diagnosis Date Noted   Gastroesophageal reflux disease 10/23/2019   Benign essential hypertension, postpartum 08/25/2016   Migraine 08/25/2016   Hypertension 11/30/2015    Past Surgical History:  Procedure Laterality Date   NO PAST SURGERIES     WISDOM TOOTH EXTRACTION  2007     OB History     Gravida  3   Para  2   Term  2   Preterm  0   AB  1   Living  2      SAB  0   IAB  1   Ectopic  0   Multiple  0   Live Births  2           Family History  Problem Relation Age of Onset   Cancer Maternal Grandfather    Cancer Paternal Grandmother    Diabetes Paternal Grandfather    Hypertension Mother    Hyperlipidemia Mother    Melanoma Father    Alcohol abuse Father    Brain cancer Other     Social History   Tobacco Use   Smoking status:  Former    Packs/day: 0.50    Types: Cigarettes    Quit date: 04/23/2015    Years since quitting: 5.4   Smokeless tobacco: Never  Substance Use Topics   Alcohol use: No   Drug use: No    Home Medications Prior to Admission medications   Medication Sig Start Date End Date Taking? Authorizing Provider  ondansetron (ZOFRAN ODT) 4 MG disintegrating tablet Take 1 tablet (4 mg total) by mouth every 8 (eight) hours as needed for nausea or vomiting. 10/12/20  Yes Abram Sax T, PA-C  SUMAtriptan (IMITREX) 50 MG tablet Take 1 tablet (50 mg total) by mouth every 2 (two) hours as needed for migraine. May repeat in 2 hours if headache persists or recurs. 10/12/20  Yes Duglas Heier T, PA-C  aspirin-acetaminophen-caffeine (EXCEDRIN MIGRAINE) [phone] MG tablet Take by mouth every 6 (six) hours as needed for headache.    [provider]  hydrochlorothiazide (MICROZIDE) 12.5 MG capsule TAKE 1 CAPSULE BY MOUTH EVERY DAY 10/04/20   Jackelyn Poling, DO  omeprazole (PRILOSEC) 20 MG capsule Take 1 capsule (20 mg total) by mouth daily. 10/17/19   Autry-Lott, Randa Evens, DO  propranolol  ER (INDERAL LA) 60 MG 24 hr capsule Take 1 capsule (60 mg total) by mouth daily. 03/17/20   Jackelyn Poling, DO    Allergies    Bee venom  Review of Systems   Review of Systems  Constitutional:  Positive for unexpected weight change. Negative for chills and fever.       Subjective weight gain  Respiratory:  Negative for shortness of breath.   Cardiovascular:  Positive for chest pain. Negative for leg swelling.  Gastrointestinal:  Positive for nausea. Negative for vomiting.  Musculoskeletal:  Positive for back pain.  Neurological:  Positive for headaches. Negative for dizziness and light-headedness.  All other systems reviewed and are negative.  Physical Exam Updated Vital Signs BP (!) 142/101   Pulse 65   Temp 98.4 F (36.9 C) (Oral)   Resp 16   Ht 5\' 6"  (1.676 m)   Wt 79.4 kg   LMP 09/06/2020 (Approximate)    SpO2 99%   BMI 28.25 kg/m   Physical Exam Vitals and nursing note reviewed.  Constitutional:      Appearance: Normal appearance.  HENT:     Head: Normocephalic and atraumatic.  Eyes:     Extraocular Movements: Extraocular movements intact.     Conjunctiva/sclera: Conjunctivae normal.  Cardiovascular:     Rate and Rhythm: Normal rate and regular rhythm.  Pulmonary:     Effort: Pulmonary effort is normal. No respiratory distress.     Breath sounds: Normal breath sounds.  Chest:     Chest wall: No tenderness.  Abdominal:     General: There is no distension.     Palpations: Abdomen is soft.     Tenderness: There is no abdominal tenderness.  Musculoskeletal:     Comments: No muscular or bony tenderness to palpation of back.  Skin:    General: Skin is warm and dry.  Neurological:     General: No focal deficit present.     Mental Status: She is alert.    ED Results / Procedures / Treatments   Labs (all labs ordered are listed, but only abnormal results are displayed) Labs Reviewed  BASIC METABOLIC PANEL - Abnormal; Notable for the following components:      Result Value   Glucose, Bld 108 (*)    All other components within normal limits  PREGNANCY, URINE  CBC  TSH  TROPONIN I (HIGH SENSITIVITY)    EKG EKG Interpretation  Date/Time:  Tuesday October 12 2020 11:29:57 EDT Ventricular Rate:  80 PR Interval:  144 QRS Duration: 94 QT Interval:  372 QTC Calculation: 429 R Axis:   42 Text Interpretation: Normal sinus rhythm with sinus arrhythmia Normal ECG Confirmed by [id number] (Eber Hong) on 10/12/2020 12:18:10 PM  Radiology DG Chest 2 View  Result Date: 10/12/2020 CLINICAL DATA:  Chest pain on the right EXAM: CHEST - 2 VIEW COMPARISON:  None. FINDINGS: The heart size and mediastinal contours are within normal limits. Both lungs are clear. The visualized skeletal structures are unremarkable. IMPRESSION: No active cardiopulmonary disease. Electronically Signed   By:  10/14/2020 M.D.   On: 10/12/2020 12:22    Procedures Procedures   Medications Ordered in ED Medications  ketorolac (TORADOL) 30 MG/ML injection 30 mg (30 mg Intravenous Given 10/12/20 1231)  ondansetron (ZOFRAN) injection 4 mg (4 mg Intravenous Given 10/12/20 1230)  magnesium sulfate IVPB 2 g 50 mL (0 g Intravenous Stopped 10/12/20 1354)  sodium chloride 0.9 % bolus 1,000 mL (0 mLs Intravenous Stopped 10/12/20 1353)  ED Course  I have reviewed the triage vital signs and the nursing notes.  Pertinent labs & imaging results that were available during my care of the patient were reviewed by me and considered in my medical decision making (see chart for details).    MDM Rules/Calculators/A&P                           Patient is 35 y/o female with history of migraines and hypertension who presents with elevated blood pressure and migraine x 5 days.   Workup to rule out ACS. Migraine cocktail ordered. BP averaging 140s systolic. TSH pending, patient to follow up with PCP to discuss results.  Patient is to be discharged with recommendation to follow up with PCP in regards to today's hospital visit. Chest pain is not likely of cardiac or pulmonary etiology d/t presentation, PERC negative, VSS, no tracheal deviation, no JVD or new murmur, RRR, breath sounds equal bilaterally, EKG without acute abnormalities, negative troponin, and negative CXR. Pt has been advised to return to the ED if CP becomes exertional, associated with diaphoresis or nausea, radiates to left jaw/arm, worsens or becomes concerning in any way. Pt appears reliable for follow up and is agreeable to discharge.   Patient reports slight improvement with migraine cocktail. Gave prescriptions for imitrex and zofran for migraine. Discussed following up with PCP for blood pressure management. Discussed reasons for return to ED. Case has been discussed with and seen by Dr. Hyacinth Meeker who agrees with the above plan to discharge.    Final  Clinical Impression(s) / ED Diagnoses Final diagnoses:  Migraine without aura and with status migrainosus, not intractable  Primary hypertension    Rx / DC Orders ED Discharge Orders          Ordered    ondansetron (ZOFRAN ODT) 4 MG disintegrating tablet  Every 8 hours PRN        10/12/20 1339    SUMAtriptan (IMITREX) 50 MG tablet  Every 2 hours PRN        10/12/20 1339             Mieczyslaw Stamas T, PA-C 10/12/20 1355    Eber Hong, MD 10/23/20 [phone]

## 2020-10-12 NOTE — ED Triage Notes (Signed)
Reports bp has been elevated for the last 5 days with headache.  Usually has migraines but excedrin migraine is not helping the headache.  Also reports right sided chest pain.  Describes cp as a tightness.

## 2020-10-13 ENCOUNTER — Other Ambulatory Visit: Payer: Self-pay

## 2020-10-13 ENCOUNTER — Encounter: Payer: Self-pay | Admitting: Family Medicine

## 2020-10-13 ENCOUNTER — Ambulatory Visit (INDEPENDENT_AMBULATORY_CARE_PROVIDER_SITE_OTHER): Payer: Medicaid Other | Admitting: Family Medicine

## 2020-10-13 VITALS — BP 128/108 | HR 84 | Ht 66.0 in | Wt 178.2 lb

## 2020-10-13 DIAGNOSIS — M791 Myalgia, unspecified site: Secondary | ICD-10-CM

## 2020-10-13 DIAGNOSIS — R7989 Other specified abnormal findings of blood chemistry: Secondary | ICD-10-CM

## 2020-10-13 DIAGNOSIS — G43709 Chronic migraine without aura, not intractable, without status migrainosus: Secondary | ICD-10-CM | POA: Diagnosis not present

## 2020-10-13 DIAGNOSIS — I10 Essential (primary) hypertension: Secondary | ICD-10-CM | POA: Diagnosis not present

## 2020-10-13 MED ORDER — HYDROCHLOROTHIAZIDE 25 MG PO TABS: 25.0000 mg | ORAL_TABLET | Freq: Every day | ORAL | 3 refills | Status: DC

## 2020-10-13 NOTE — Patient Instructions (Signed)
We will draw some more blood work today to look at your very slightly abnormal thyroid level.  If it is drastically abnormal I will call you.  Otherwise I will send you a note in the mail.  Regarding her blood pressure, I think I would recommend increasing your hydrochlorothiazide to 25 mg tabs.  I will call a prescription in for that.  In the interim, you can take 2 of the existing tabs that are 12.5 mg daily until you run out of them.  I will put in a referral to the headache clinic.  You should hear from them in the next 7 to 10 days.  If you have not, please call the office and leave me a message to that effect.  Please make an appointment to see your PCP, Dr. Vincente Poli, in about a month.  Regarding the overall body aches, it could be something like fibromyalgia.  As we discussed, that is a diagnosis of exclusion, but I will go ahead and give you some information on it.Myofascial Pain Syndrome and Fibromyalgia Myofascial pain syndrome and fibromyalgia are both pain disorders. This pain may be felt mainly in your muscles. Myofascial pain syndrome: Always has tender points in the muscle that will cause pain when pressed (trigger points). The pain may come and go. Usually affects your neck, upper back, and shoulder areas. The pain often radiates into your arms and hands. Fibromyalgia: Has muscle pains and tenderness that come and go. Is often associated with fatigue and sleep problems. Has trigger points. Tends to be long-lasting (chronic), but is not life-threatening. Fibromyalgia and myofascial pain syndrome are not the same. However, they often occur together. If you have both conditions, each can make the other worse. Both are common and can cause enough pain and fatigue to make day-to-day activities difficult. Both can be hard to diagnose because their symptoms are common in many other conditions. What are the causes? The exact causes of these conditions are not known. What increases the  risk? You are more likely to develop this condition if: You have a family history of the condition. You have certain triggers, such as: Spine disorders. An injury (trauma) or other physical stressors. Being under a lot of stress. Medical conditions such as osteoarthritis, rheumatoid arthritis, or lupus. What are the signs or symptoms? Fibromyalgia The main symptom of fibromyalgia is widespread pain and tenderness in your muscles. Pain is sometimes described as stabbing, shooting, or burning. You may also have: Tingling or numbness. Sleep problems and fatigue. Problems with attention and concentration (fibro fog). Other symptoms may include:  Bowel and bladder problems. Headaches. Visual problems. Problems with odors and noises. Depression or mood changes. Painful menstrual periods (dysmenorrhea). Dry skin or eyes. These symptoms can vary over time. Myofascial pain syndrome Symptoms of myofascial pain syndrome include: Tight, ropy bands of muscle. Uncomfortable sensations in muscle areas. These may include aching, cramping, burning, numbness, tingling, and weakness. Difficulty moving certain parts of the body freely (poor range of motion). How is this diagnosed? This condition may be diagnosed by your symptoms and medical history. You will also have a physical exam. In general: Fibromyalgia is diagnosed if you have pain, fatigue, and other symptoms for more than 3 months, and symptoms cannot be explained by another condition. Myofascial pain syndrome is diagnosed if you have trigger points in your muscles, and those trigger points are tender and cause pain elsewhere in your body (referred pain). How is this treated? Treatment for these conditions depends on  the type that you have.  Treating these conditions often requires a team of health care providers. These may include: Your primary care provider.  Lifestyle  Exercise as directed by your health care provider or physical  therapist. Practice relaxation techniques to control your stress. You may want to try: Biofeedback. Visual imagery. Hypnosis. Muscle relaxation. Yoga. Meditation. Maintain a healthy lifestyle. This includes eating a healthy diet and getting enough sleep. Do not use any products that contain nicotine or tobacco, such as cigarettes and e-cigarettes. If you need help quitting, ask your health care provider. General instructions Talk to your health care provider about complementary treatments, such as acupuncture or massage. Consider joining a support group with others who are diagnosed with this condition. Do not do activities that stress or strain your muscles. This includes repetitive motions and heavy lifting. Keep all follow-up visits as told by your health care provider. This is important. Where to find more information National Fibromyalgia Association: www.fmaware.org Arthritis Foundation: www.arthritis.org American Chronic Pain Association: www.theacpa.org Contact a health care provider if: You have new symptoms. Your symptoms get worse or your pain is severe. You have side effects from your medicines. You have trouble sleeping. Your condition is causing depression or anxiety. Summary Myofascial pain syndrome and fibromyalgia are pain disorders. Myofascial pain syndrome has tender points in the muscle that will cause pain when pressed (trigger points). Fibromyalgia also has muscle pains and tenderness that come and go, but this condition is often associated with fatigue and sleep disturbances. Fibromyalgia and myofascial pain syndrome are not the same but often occur together, causing pain and fatigue that make day-to-day activities difficult. Treatment for fibromyalgia includes taking medicines to relax the muscles and medicines for pain, depression, or seizures. Treatment for myofascial pain syndrome includes taking medicines for pain, cooling and stretching of muscles, and  injecting medicines into trigger points. Follow your health care provider's instructions for taking medicines and maintaining a healthy lifestyle. This information is not intended to replace advice given to you by your health care provider. Make sure you discuss any questions you have with your health care provider. Document Revised: 05/24/2018 Document Reviewed: 02/14/2017 Elsevier Patient Education  2022 [employer].

## 2020-10-13 NOTE — Telephone Encounter (Signed)
Patient returns call to nurse line. Patient reports that she went to the ED, however, they only sent in medication for migraines, in which she was unable to pick up. Patient reports that she was told to follow up with our office for further medication management of BP.   Reports that she has continued to have headache, elevated BP and fatigue.   Scheduled patient for same day appointment this morning for further evaluation of BP.   Routing to PCP and Dr. Jennette Kettle (who will be seeing patient this AM)  Veronda Prude, RN

## 2020-10-14 DIAGNOSIS — M791 Myalgia, unspecified site: Secondary | ICD-10-CM | POA: Insufficient documentation

## 2020-10-14 LAB — TSH+T4F+T3FREE
Free T4: 1.28 ng/dL (ref 0.82–1.77)
T3, Free: 2.7 pg/mL (ref 2.0–4.4)
TSH: 0.198 u[IU]/mL — ABNORMAL LOW (ref 0.450–4.500)

## 2020-10-14 NOTE — Progress Notes (Signed)
    CHIEF COMPLAINT / HPI: #1.  Seen at urgent care for headache with elevated blood pressure.  Has had migraines for years and has never really found anything that worked well.  She has tried various medications in the past.  Would like to be referred to neurology or headache center.  They did give her something for the migraine which improved yesterday.   #2 hypertension: Her blood pressure was elevated at that visit for headache yesterday and has been previously elevated a couple of times.  She currently is on 12.5 mg of HCTZ without any side effects.  She is taking it regularly. #3.  Abnormal TSH: They checked TSH yesterday and it was abnormal.  She is here for follow-up.  No hair loss.  No unusual weight change.  She does have pretty significant aches and pains in a lot of muscle groups most of the time and that has been going on for a month or so.   PERTINENT  PMH / PSH: I have reviewed the patient's medications, allergies, past medical and surgical history, smoking status and updated in the EMR as appropriate.   OBJECTIVE:  BP (!) 128/108   Pulse 84   Ht 5\' 6"  (1.676 m)   Wt 178 lb 3.2 oz (80.8 kg)   SpO2 99%   BMI 28.76 kg/m  Vital signs reviewed. GENERAL: Well-developed, well-nourished, no acute distress. CARDIOVASCULAR: Regular rate and rhythm no murmur gallop or rub LUNGS: Clear to auscultation bilaterally, no rales or wheeze. ABDOMEN: Soft positive bowel sounds NEURO: No gross focal neurological deficits. MSK: Movement of extremity x 4.  Normal muscle bulk and tone. Review of lab work from yesterday at the urgent care center shows TSH of 0.251.  She is not on any thyroid supplement.  Review of her other lab work which was CBC and CMP is normal except for very slightly elevated glucose at 108 however this was nonfasting.  ASSESSMENT / PLAN:   Migraine Has had history of migraines since she was a teenager.  Has tried propranolol and a few other medications including  sumatriptan for breakthrough, without any success.  Her I will do a referral to headache center/neurology.  Hypertension Review of her blood pressure shows that she is not really adequately controlled.  I will increase her HCTZ to 25 mg a day.  Would like to have her follow-up with her PCP in the next 1 month and they would need to consider recheck of potassium at that point.  She had blood work done yesterday and it was normal.  Myalgia Brief discussion of her myalgias.  Could be many causes.  I did briefly give her some information about myofascial pain syndrome and fibromyalgia but by no means a mild diagnosing these.  She needs to follow-up with her PCP in 1 month for further evaluation and management.   MD

## 2020-10-14 NOTE — Assessment & Plan Note (Signed)
Has had history of migraines since she was a teenager.  Has tried propranolol and a few other medications including sumatriptan for breakthrough, without any success.  Her I will do a referral to headache center/neurology.

## 2020-10-14 NOTE — Assessment & Plan Note (Signed)
Review of her blood pressure shows that she is not really adequately controlled.  I will increase her HCTZ to 25 mg a day.  Would like to have her follow-up with her PCP in the next 1 month and they would need to consider recheck of potassium at that point.  She had blood work done yesterday and it was normal.

## 2020-10-14 NOTE — Assessment & Plan Note (Signed)
Brief discussion of her myalgias.  Could be many causes.  I did briefly give her some information about myofascial pain syndrome and fibromyalgia but by no means a mild diagnosing these.  She needs to follow-up with her PCP in 1 month for further evaluation and management.

## 2020-10-19 ENCOUNTER — Telehealth: Payer: Self-pay

## 2020-10-19 NOTE — Telephone Encounter (Signed)
Patient calls nurse line regarding results of thyroid tests. Patient has questions regarding next steps.   Veronda Prude, RN

## 2020-10-20 ENCOUNTER — Encounter: Payer: Self-pay | Admitting: Family Medicine

## 2020-10-20 DIAGNOSIS — R946 Abnormal results of thyroid function studies: Secondary | ICD-10-CM

## 2020-10-20 NOTE — Telephone Encounter (Signed)
Patient returns call to nurse line. Informed of result message per Dr. Jennette Kettle. Patient still has additional questions regarding continued fatigue.   Recommended that patient discuss this further at office visit tomorrow.   Veronda Prude, RN

## 2020-10-21 ENCOUNTER — Ambulatory Visit: Payer: Medicaid Other

## 2020-11-10 ENCOUNTER — Ambulatory Visit: Payer: Medicaid Other | Admitting: Family Medicine

## 2020-11-14 ENCOUNTER — Encounter (HOSPITAL_BASED_OUTPATIENT_CLINIC_OR_DEPARTMENT_OTHER): Payer: Self-pay | Admitting: Emergency Medicine

## 2020-11-14 ENCOUNTER — Other Ambulatory Visit: Payer: Self-pay | Admitting: Family Medicine

## 2020-11-14 ENCOUNTER — Emergency Department (HOSPITAL_BASED_OUTPATIENT_CLINIC_OR_DEPARTMENT_OTHER)
Admission: EM | Admit: 2020-11-14 | Discharge: 2020-11-14 | Disposition: A | Discharge status: Home / Self Care | Payer: Medicaid Other | Attending: Emergency Medicine | Admitting: Emergency Medicine

## 2020-11-14 ENCOUNTER — Other Ambulatory Visit: Payer: Self-pay

## 2020-11-14 DIAGNOSIS — B9789 Other viral agents as the cause of diseases classified elsewhere: Secondary | ICD-10-CM | POA: Diagnosis not present

## 2020-11-14 DIAGNOSIS — K219 Gastro-esophageal reflux disease without esophagitis: Secondary | ICD-10-CM

## 2020-11-14 DIAGNOSIS — R059 Cough, unspecified: Secondary | ICD-10-CM | POA: Diagnosis not present

## 2020-11-14 DIAGNOSIS — I10 Essential (primary) hypertension: Secondary | ICD-10-CM | POA: Diagnosis not present

## 2020-11-14 DIAGNOSIS — Z87891 Personal history of nicotine dependence: Secondary | ICD-10-CM | POA: Insufficient documentation

## 2020-11-14 DIAGNOSIS — J069 Acute upper respiratory infection, unspecified: Secondary | ICD-10-CM | POA: Diagnosis not present

## 2020-11-14 DIAGNOSIS — Z79899 Other long term (current) drug therapy: Secondary | ICD-10-CM | POA: Insufficient documentation

## 2020-11-14 DIAGNOSIS — J45909 Unspecified asthma, uncomplicated: Secondary | ICD-10-CM | POA: Insufficient documentation

## 2020-11-14 DIAGNOSIS — Z20822 Contact with and (suspected) exposure to covid-19: Secondary | ICD-10-CM | POA: Insufficient documentation

## 2020-11-14 LAB — GROUP A STREP BY PCR: Group A Strep by PCR: NOT DETECTED

## 2020-11-14 NOTE — ED Provider Notes (Signed)
[facility] EMERGENCY DEPARTMENT Provider Note   CSN: [id number] Arrival date & time: 11/14/20  1136     History Chief Complaint  Patient presents with   Sore Throat   Cough    Michele Gardner is a 35 y.o. female with a past medical history of asthma who presents today with a complaint of sore throat and cough since Thursday.  Patient reports on Thursday she began to experience chills and pain with swallowing.  Has had strep multiple times however does not feel this feels like her previous strep throat.  Cough is nonproductive.  Son was sick last week.  Has used over-the-counter remedies, tea and soup and noticed improvement.  No chest pain, difficulty breathing or objective fevers.  Would not have come to be evaluated on her own but wanted to be seen because she is already here with her daughter.   Sore Throat Pertinent negatives include no chest pain, no abdominal pain and no shortness of breath.  Cough Associated symptoms: chills and sore throat   Associated symptoms: no chest pain, no ear pain, no fever, no rash and no shortness of breath       Past Medical History:  Diagnosis Date   Asthma    Hypertension    Medical history non-contributory     Patient Active Problem List   Diagnosis Date Noted   Myalgia 10/14/2020   Gastroesophageal reflux disease 10/23/2019   Benign essential hypertension, postpartum 08/25/2016   Migraine 08/25/2016   Hypertension 11/30/2015    Past Surgical History:  Procedure Laterality Date   NO PAST SURGERIES     WISDOM TOOTH EXTRACTION  2007     OB History     Gravida  3   Para  2   Term  2   Preterm  0   AB  1   Living  2      SAB  0   IAB  1   Ectopic  0   Multiple  0   Live Births  2           Family History  Problem Relation Age of Onset   Cancer Maternal Grandfather    Cancer Paternal Grandmother    Diabetes Paternal Grandfather    Hypertension Mother    Hyperlipidemia Mother    Melanoma  Father    Alcohol abuse Father    Brain cancer Other     Social History   Tobacco Use   Smoking status: Former    Packs/day: 0.50    Types: Cigarettes    Quit date: 04/23/2015    Years since quitting: 5.5   Smokeless tobacco: Never  Substance Use Topics   Alcohol use: No   Drug use: No    Home Medications Prior to Admission medications   Medication Sig Start Date End Date Taking? Authorizing Provider  aspirin-acetaminophen-caffeine (EXCEDRIN MIGRAINE) [phone] MG tablet Take by mouth every 6 (six) hours as needed for headache.    [provider]  hydrochlorothiazide (HYDRODIURIL) 25 MG tablet Take 1 tablet (25 mg total) by mouth daily. 10/13/20   Nestor Ramp, MD    Allergies    Bee venom  Review of Systems   Review of Systems  Constitutional:  Positive for chills. Negative for activity change and fever.  HENT:  Positive for congestion, postnasal drip and sore throat. Negative for ear pain.   Respiratory:  Positive for cough. Negative for shortness of breath.   Cardiovascular:  Negative for  chest pain and palpitations.  Gastrointestinal:  Negative for abdominal pain, nausea and vomiting.  Skin:  Negative for rash.  All other systems reviewed and are negative.  Physical Exam Updated Vital Signs BP (!) 143/105   Pulse 87   Temp 98.3 F (36.8 C) (Oral)   Resp 16   Ht 5\' 6"  (1.676 m)   Wt 81.6 kg   SpO2 98%   BMI 29.05 kg/m   Physical Exam Vitals and nursing note reviewed.  Constitutional:      Appearance: Normal appearance.  HENT:     Head: Normocephalic and atraumatic.     Right Ear: Tympanic membrane and ear canal normal.     Left Ear: Tympanic membrane and ear canal normal.     Nose: Congestion present.     Mouth/Throat:     Mouth: Mucous membranes are moist.     Pharynx: Oropharynx is clear.     Tonsils: 1+ on the right. 1+ on the left.  Eyes:     General: No scleral icterus.    Conjunctiva/sclera: Conjunctivae normal.  Cardiovascular:      Rate and Rhythm: Normal rate.     Heart sounds: No murmur heard. Pulmonary:     Effort: Pulmonary effort is normal. No respiratory distress.     Breath sounds: Normal breath sounds. No wheezing.  Abdominal:     Palpations: Abdomen is soft.     Tenderness: There is no abdominal tenderness.  Musculoskeletal:     Cervical back: Normal range of motion.  Lymphadenopathy:     Cervical: No cervical adenopathy.  Skin:    General: Skin is warm and dry.     Findings: No rash.  Neurological:     Mental Status: She is alert.  Psychiatric:        Mood and Affect: Mood normal.        Behavior: Behavior normal.    ED Results / Procedures / Treatments   Labs (all labs ordered are listed, but only abnormal results are displayed) Labs Reviewed  GROUP A STREP BY PCR  SARS CORONAVIRUS 2 (TAT 6-24 HRS)    EKG None  Radiology No results found.  Procedures Procedures   Medications Ordered in ED Medications - No data to display  ED Course  I have reviewed the triage vital signs and the nursing notes.  Pertinent labs & imaging results that were available during my care of the patient were reviewed by me and considered in my medical decision making (see chart for details).    MDM Rules/Calculators/A&P  Patient was evaluated by me.  She was in no acute distress and reported that she really only came to the emergency department to have her daughter evaluated.  Decided to be evaluated while she was here.  She was tested for both strep throat COVID-19/flu.  Strep throat negative.  Patient aware that her other tests may not come back today.  She will be able to see these results in her chart or expect a phone call if they are abnormal.  She has had no difficulty breathing or chest pain.  Her cough is nonproductive.  She is still able to eat and drink and feels that her symptoms are improving.  I believe her to be experiencing some type of viral upper respiratory infection.  She may continue to  treat this over-the-counter and return if her symptoms worsen or she develops difficulty breathing, chest pain or other severe symptoms.  Stable for discharge at this time.  Final Clinical Impression(s) / ED Diagnoses Final diagnoses:  Viral URI with cough    Rx / DC Orders Results and diagnoses were explained to the patient. Return precautions discussed in full. Patient had no additional questions and expressed complete understanding.     Woodroe Chen 11/14/20 1428    Tegeler, Canary Brim, MD 11/14/20 [phone]

## 2020-11-14 NOTE — ED Notes (Signed)
ED Provider at bedside. 

## 2020-11-14 NOTE — Discharge Instructions (Addendum)
Your strep test is negative.  Your COVID-19 and flu testing will not come back today however you will be able to see these results in your chart.  You may continue with any over-the-counter remedies that help your symptoms.  It was a pleasure to meet you and I hope that you feel better!

## 2020-11-14 NOTE — ED Triage Notes (Signed)
Pt reports sore throat, cough

## 2020-11-15 LAB — SARS CORONAVIRUS 2 (TAT 6-24 HRS): SARS Coronavirus 2: NEGATIVE

## 2020-11-25 ENCOUNTER — Ambulatory Visit (INDEPENDENT_AMBULATORY_CARE_PROVIDER_SITE_OTHER): Payer: Medicaid Other | Admitting: Obstetrics & Gynecology

## 2020-11-25 ENCOUNTER — Encounter: Payer: Self-pay | Admitting: Obstetrics & Gynecology

## 2020-11-25 ENCOUNTER — Other Ambulatory Visit: Payer: Self-pay

## 2020-11-25 VITALS — BP 124/98 | HR 82 | Wt 180.0 lb

## 2020-11-25 DIAGNOSIS — Z30432 Encounter for removal of intrauterine contraceptive device: Secondary | ICD-10-CM

## 2020-11-25 NOTE — Progress Notes (Signed)
Patient is here because she would like her IUD removed today. She believes that it is causing problems with her blood pressure

## 2020-11-25 NOTE — Progress Notes (Signed)
Patient ID: Michele Gardner, female   DOB: [birth year], 35 y.o.   MRN: [id number]    GYNECOLOGY OFFICE PROCEDURE NOTE  Michele Gardner is a 35 y.o. [id number] here for Liletta IUD removal. No GYN concerns.  Last pap smear was on 08/08/2019 and was normal.  IUD Removal  Patient identified, informed consent performed, consent signed.  Patient was in the dorsal lithotomy position, normal external genitalia was noted.  A speculum was placed in the patient's vagina, normal discharge was noted, no lesions. The cervix was visualized, no lesions, no abnormal discharge.  The strings of the IUD were grasped and pulled using forceps. The IUD was removed in its entirety. Patient tolerated the procedure well.    Patient will use abstinence for contraception.  Routine preventative health maintenance measures emphasized.   Adam Phenix, MD Obstetrician & Gynecologist, [facility] for [facility], [facility]

## 2020-12-22 ENCOUNTER — Other Ambulatory Visit: Payer: Self-pay

## 2020-12-22 ENCOUNTER — Emergency Department (HOSPITAL_COMMUNITY)
Admission: EM | Admit: 2020-12-22 | Discharge: 2020-12-23 | Disposition: A | Discharge status: Home / Self Care | Payer: Medicaid Other | Attending: Emergency Medicine | Admitting: Emergency Medicine

## 2020-12-22 ENCOUNTER — Encounter (HOSPITAL_COMMUNITY): Payer: Self-pay

## 2020-12-22 ENCOUNTER — Telehealth: Payer: Self-pay

## 2020-12-22 DIAGNOSIS — I10 Essential (primary) hypertension: Secondary | ICD-10-CM | POA: Diagnosis not present

## 2020-12-22 DIAGNOSIS — J45909 Unspecified asthma, uncomplicated: Secondary | ICD-10-CM | POA: Diagnosis not present

## 2020-12-22 DIAGNOSIS — M549 Dorsalgia, unspecified: Secondary | ICD-10-CM | POA: Diagnosis not present

## 2020-12-22 DIAGNOSIS — R110 Nausea: Secondary | ICD-10-CM | POA: Insufficient documentation

## 2020-12-22 DIAGNOSIS — Z87891 Personal history of nicotine dependence: Secondary | ICD-10-CM | POA: Insufficient documentation

## 2020-12-22 DIAGNOSIS — R519 Headache, unspecified: Secondary | ICD-10-CM | POA: Insufficient documentation

## 2020-12-22 MED ORDER — NIFEDIPINE ER OSMOTIC RELEASE 60 MG PO TB24: 60.0000 mg | ORAL_TABLET | Freq: Every day | ORAL | 3 refills | Status: DC

## 2020-12-22 MED ORDER — ACETAMINOPHEN 500 MG PO TABS
1000.0000 mg | ORAL_TABLET | Freq: Once | ORAL | Status: AC
  Administered 2020-12-22: 1000 mg via ORAL
  Filled 2020-12-22: qty 2

## 2020-12-22 NOTE — ED Provider Notes (Signed)
Emergency Medicine Provider Triage Evaluation Note  EMALEE KNIES , a 35 y.o. female  was evaluated in triage.  Pt complains of migraine for the past 2 days.  She does have a history of migraines.  Recently found out she was pregnant with an at home pregnancy test.  Has been taking Aleve and Excedrin.  We discussed that she should not take these medications during pregnancy.  Has not tried Tylenol.  Review of Systems  Positive: Headaches Negative: Syncope  Physical Exam  BP (!) 133/97 (BP Location: Left Arm)   Pulse 78   Temp 98.5 F (36.9 C) (Oral)   Resp 18   Ht 5\' 6"  (1.676 m)   Wt 84.8 kg   SpO2 98%   BMI 30.18 kg/m  Gen:   Awake, no distress   Resp:  Normal effort  MSK:   Moves extremities without difficulty  Other:    Medical Decision Making  Medically screening exam initiated at 10:13 PM.  Appropriate orders placed.  IMOGENE GRAVELLE was informed that the remainder of the evaluation will be completed by another provider, this initial triage assessment does not replace that evaluation, and the importance of remaining in the ED until their evaluation is complete.     Sabas Sous, PA-C 12/22/20 2214    2215, DO 12/24/20 [phone]

## 2020-12-22 NOTE — Telephone Encounter (Signed)
Returned patient's call.  Patient states she has scheduled an initial OB appointment with an OB/GYN.  I discussed with her that nifedipine would be treatment of choice for elevated blood pressure during pregnancy.  We are going to discontinue the hydrochlorothiazide and I have sent in nifedipine to her pharmacy.  Patient reports that she has not had an OB/GYN appointment scheduled early next week.  She had no further questions at this time.

## 2020-12-22 NOTE — Telephone Encounter (Signed)
Patient calls nurse line requesting to speak with provider regarding possibly adjusting BP medication. Patient reports positive home pregnancy test on 11/7. Patient does not have LMP as she had IUD removed on 10/13.  Patient is going to be scheduling new OB visit with gynecologist. Please advise how patient should proceed with BP medication.   Veronda Prude, RN

## 2020-12-22 NOTE — ED Triage Notes (Signed)
Pt reports recurrent headaches that started 2 days ago after positive home pregnancy test. Spoke to PCP today and had a new rx for antihypertensives called in.

## 2020-12-23 LAB — PREGNANCY, URINE: Preg Test, Ur: POSITIVE — AB

## 2020-12-23 MED ORDER — METOCLOPRAMIDE HCL 10 MG PO TABS
5.0000 mg | ORAL_TABLET | Freq: Once | ORAL | Status: AC
  Administered 2020-12-23: 5 mg via ORAL
  Filled 2020-12-23: qty 1

## 2020-12-23 NOTE — ED Provider Notes (Signed)
[facility]EMERGENCY [facility]PT Provider Note  CSN: [id number] Arrival date & time: 12/22/20 2127  Chief Complaint(s) Headache  HPI Michele Gardner is a 35 y.o. female    Headache Pain location:  R parietal and R temporal Quality: throbbing. Onset quality:  Gradual Timing:  Intermittent Progression:  Waxing and waning Chronicity:  Chronic Similar to prior headaches: yes   Relieved by:  NSAIDs and acetaminophen Worsened by:  Activity and light Associated symptoms: back pain, nausea and photophobia   Associated symptoms: no blurred vision, no cough, no dizziness, no fatigue, no fever, no visual change and no vomiting    Already scheduled for Neurology for evaluation of headaches.  Reports that she is three weeks pregnant. Trying to set up OB appt. Past Medical History Past Medical History:  Diagnosis Date   Asthma    Hypertension    Medical history non-contributory    Patient Active Problem List   Diagnosis Date Noted   Myalgia 10/14/2020   Gastroesophageal reflux disease 10/23/2019   Benign essential hypertension, postpartum 08/25/2016   Migraine 08/25/2016   Hypertension 11/30/2015   Home Medication(s) Prior to Admission medications   Medication Sig Start Date End Date Taking? Authorizing Provider  aspirin-acetaminophen-caffeine (EXCEDRIN MIGRAINE) [phone] MG tablet Take by mouth every 6 (six) hours as needed for headache. Patient not taking: Reported on 11/25/2020    [provider]  NIFEdipine (PROCARDIA XL/NIFEDICAL XL) 60 MG 24 hr tablet Take 1 tablet (60 mg total) by mouth at bedtime. 12/22/20   Jackelyn Poling, DO                                                                                                                                    Past Surgical History Past Surgical History:  Procedure Laterality Date   NO PAST SURGERIES     WISDOM TOOTH EXTRACTION  2007   Family History Family History  Problem Relation Age of Onset    Cancer Maternal Grandfather    Cancer Paternal Grandmother    Diabetes Paternal Grandfather    Hypertension Mother    Hyperlipidemia Mother    Melanoma Father    Alcohol abuse Father    Brain cancer Other     Social History Social History   Tobacco Use   Smoking status: Former    Packs/day: 0.50    Types: Cigarettes    Quit date: 04/23/2015    Years since quitting: 5.6   Smokeless tobacco: Never  Substance Use Topics   Alcohol use: No   Drug use: No   Allergies Bee venom  Review of Systems Review of Systems  Constitutional:  Negative for fatigue and fever.  Eyes:  Positive for photophobia. Negative for blurred vision.  Respiratory:  Negative for cough.   Gastrointestinal:  Positive for nausea. Negative for vomiting.  Musculoskeletal:  Positive for back pain.  Neurological:  Positive for headaches. Negative for dizziness.  All  other systems are reviewed and are negative for acute change except as noted in the HPI  Physical Exam Vital Signs  I have reviewed the triage vital signs BP (!) 141/100   Pulse 63   Temp 98.5 F (36.9 C) (Oral)   Resp 18   Ht 5\' 6"  (1.676 m)   Wt 84.8 kg   SpO2 98%   BMI 30.18 kg/m   Physical Exam Vitals reviewed.  Constitutional:      General: She is not in acute distress.    Appearance: She is well-developed. She is not diaphoretic.  HENT:     Head: Normocephalic and atraumatic.     Right Ear: External ear normal.     Left Ear: External ear normal.     Nose: Nose normal.  Eyes:     General: No scleral icterus.    Conjunctiva/sclera: Conjunctivae normal.  Neck:     Trachea: Phonation normal.  Cardiovascular:     Rate and Rhythm: Normal rate and regular rhythm.  Pulmonary:     Effort: Pulmonary effort is normal. No respiratory distress.     Breath sounds: No stridor.  Abdominal:     General: There is no distension.  Musculoskeletal:        General: Normal range of motion.     Cervical back: Normal range of motion.   Neurological:     Mental Status: She is alert and oriented to person, place, and time.     Cranial Nerves: Cranial nerves 2-12 are intact.     Sensory: Sensation is intact.     Motor: Motor function is intact.  Psychiatric:        Behavior: Behavior normal.    ED Results and Treatments Labs (all labs ordered are listed, but only abnormal results are displayed) Labs Reviewed  PREGNANCY, URINE - Abnormal; Notable for the following components:      Result Value   Preg Test, Ur POSITIVE (*)    All other components within normal limits                                                                                                                         EKG  EKG Interpretation  Date/Time:    Ventricular Rate:    PR Interval:    QRS Duration:   QT Interval:    QTC Calculation:   R Axis:     Text Interpretation:         Radiology No results found.  Pertinent labs & imaging results that were available during my care of the patient were reviewed by me and considered in my medical decision making (see MDM for details).  Medications Ordered in ED Medications  acetaminophen (TYLENOL) tablet 1,000 mg (1,000 mg Oral Given 12/22/20 2216)  metoCLOPramide (REGLAN) tablet 5 mg (5 mg Oral Given 12/23/20 0036)  Procedures Procedures  (including critical care time)  Medical Decision Making / ED Course I have reviewed the nursing notes for this encounter and the patient's prior records (if available in EHR or on provided paperwork).  Michele Gardner was evaluated in Emergency Department on 12/23/2020 for the symptoms described in the history of present illness. She was evaluated in the context of the global COVID-19 pandemic, which necessitated consideration that the patient might be at risk for infection with the SARS-CoV-2 virus that causes COVID-19.  Institutional protocols and algorithms that pertain to the evaluation of patients at risk for COVID-19 are in a state of rapid change based on information released by regulatory bodies including the CDC and federal and state organizations. These policies and algorithms were followed during the patient's care in the ED.     Typical migraine headache for the pt. Non focal neuro exam. No recent head trauma. No fever. Doubt meningitis. Doubt intracranial bleed. Doubt IIH. No indication for imaging.   Pain already improved with tylenol here. Given additional reglan.   Final Clinical Impression(s) / ED Diagnoses Final diagnoses:  Bad headache    The patient appears reasonably screened and/or stabilized for discharge and I doubt any other medical condition or other [facility] requiring further screening, evaluation, or treatment in the ED at this time prior to discharge. Safe for discharge with strict return precautions.  Disposition: Discharge  Condition: Good  I have discussed the results, Dx and Tx plan with the patient/family who expressed understanding and agree(s) with the plan. Discharge instructions discussed at length. The patient/family was given strict return precautions who verbalized understanding of the instructions. No further questions at time of discharge.    ED Discharge Orders     None        Follow Up: No follow-up provider specified.   This chart was dictated using voice recognition software.  Despite best efforts to proofread,  errors can occur which can change the documentation meaning.    Nira Conn, MD 12/23/20 [phone]

## 2020-12-24 ENCOUNTER — Encounter: Payer: Self-pay | Admitting: Family Medicine

## 2020-12-24 ENCOUNTER — Other Ambulatory Visit: Payer: Self-pay

## 2020-12-24 ENCOUNTER — Ambulatory Visit (INDEPENDENT_AMBULATORY_CARE_PROVIDER_SITE_OTHER): Payer: Medicaid Other | Admitting: Family Medicine

## 2020-12-24 DIAGNOSIS — G43709 Chronic migraine without aura, not intractable, without status migrainosus: Secondary | ICD-10-CM | POA: Diagnosis not present

## 2020-12-24 DIAGNOSIS — Z3491 Encounter for supervision of normal pregnancy, unspecified, first trimester: Secondary | ICD-10-CM | POA: Insufficient documentation

## 2020-12-24 MED ORDER — CODEINE SULFATE 30 MG PO TABS: 30.0000 mg | ORAL_TABLET | Freq: Four times a day (QID) | ORAL | 0 refills | Status: DC | PRN

## 2020-12-24 MED ORDER — CODEINE SULFATE 30 MG PO TABS: 30.0000 mg | ORAL_TABLET | Freq: Four times a day (QID) | ORAL | 0 refills | Status: AC | PRN

## 2020-12-24 NOTE — Assessment & Plan Note (Signed)
Temporal, pulsating migraine for the last 4 days.  She received some improvement with Tylenol and Reglan in the ED.  She has not been taking scheduled Tylenol at home.  We discussed options of taking at 1000 mg every 8 hours versus trying Tylenol and codeine to see if she could get some relief.  She opted to try the Tylenol and codeine so she did not have relief with the 1000 mg of Tylenol yesterday.  Reassuringly, her neuro exam was unremarkable.  We discussed ED precautions for any changes in vision or weakness.  We discussed that if her nausea became so severe that she was starting to vomit and unable to keep down fluids she should also present to the MAU for rehydration.  -Continue Tylenol, can take 1000 mg q8h.  -3-day Rx sent for 30 mg of codeine as needed every 6 hours for migraine -Return precautions provided

## 2020-12-24 NOTE — Assessment & Plan Note (Signed)
Both home pregnancy test was positive in the ED pregnancy test positive.  States that she has a scheduled OB/GYN appointment in January, when she is 11 weeks.  She does have a history of chronic hypertension (on nifedipine) and her blood pressures were mildly elevated today.  I discussed that if her blood pressures remain elevated, or she becomes symptomatic, she should definitely reach out to her OB/GYN for further management.

## 2020-12-24 NOTE — Progress Notes (Signed)
    SUBJECTIVE:   CHIEF COMPLAINT / HPI:   Headache, Migraine Michele Gardner is a 35 y.o. female with PMHx of migraine who presents to the clinic today with concerns of continued headache/migraine for the last 4 days. She was seen in the ED on 11/9 for similar concerns and received Tylenol and Reglan.  She notes that her typical migraines are more in the occipital region.  Her current migraine is more temporal and pulsating.  She has some photophobia and slight phonophobia.  No weakness or visual changes.  She states that she took 500 mg of Tylenol today and 1000 mg of Tylenol yesterday with only slight improvement.  She is hesitant to take any medications because she does not know the safety profile in her pregnancy.  States that she would typically take Excedrin and Goody powder for her migraines but has stopped when she discovered she was pregnant.  Positive Pregnancy Test Recently had a positive home test. Was also positive in the ED. Unknown LMP as she had IUD removed in early October. She plans to have prenatal care performed at [facility] office.  States that she has an appointment scheduled in January because that is when she will be 11 weeks.  PERTINENT  PMH / PSH:  GERD, HTN, migraine   OBJECTIVE:   BP 133/89   Pulse 75   Wt 183 lb 3.2 oz (83.1 kg)   SpO2 98%   BMI 29.57 kg/m   133/89  General: NAD, pleasant, able to participate in exam Respiratory: Breathing comfortably on room air, in no respiratory distress Extremities: no edema or cyanosis. Skin: warm and dry, no rashes noted Neuro: alert, CN3-12 intact, speaking in clear sentences, no slurring, 5/5 muscle strength, no focal weakness Psych: Normal affect and mood  ASSESSMENT/PLAN:   Migraine Temporal, pulsating migraine for the last 4 days.  She received some improvement with Tylenol and Reglan in the ED.  She has not been taking scheduled Tylenol at home.  We discussed options of taking at 1000 mg every 8 hours versus  trying Tylenol and codeine to see if she could get some relief.  She opted to try the Tylenol and codeine so she did not have relief with the 1000 mg of Tylenol yesterday.  Reassuringly, her neuro exam was unremarkable.  We discussed ED precautions for any changes in vision or weakness.  We discussed that if her nausea became so severe that she was starting to vomit and unable to keep down fluids she should also present to the MAU for rehydration.  -Continue Tylenol, can take 1000 mg q8h.  -3-day Rx sent for 30 mg of codeine as needed every 6 hours for migraine -Return precautions provided  Currently pregnant in first trimester with unknown gestational age Both home pregnancy test was positive in the ED pregnancy test positive.  States that she has a scheduled OB/GYN appointment in January, when she is 11 weeks.  She does have a history of chronic hypertension (on nifedipine) and her blood pressures were mildly elevated today.  I discussed that if her blood pressures remain elevated, or she becomes symptomatic, she should definitely reach out to her OB/GYN for further management.     Sabino Dick, DO [place] [facility] [facility]

## 2020-12-24 NOTE — Patient Instructions (Addendum)
It was wonderful to see you today.  Please bring ALL of your medications with you to every visit.   Today we talked about:  -I am so sorry that you have been dealing with your migraine for this long.  I am sending prescription for codeine to your pharmacy.  Do not use this for more than three days.  -If your nausea persists and you develop vomiting and are unable to keep down fluids, it is important that you be evaluated. Hydration is very important, especially in pregnancy! -If the migraine does not get better, please return.  Please go to your scheduled neurology appointment. -Your blood pressure is a little elevated today. If your blood pressure remains elevated, please contact your OB/GYN.  -Continue to take prenatal vitamins.   Thank you for choosing [facility].   Please call [phone] with any questions about today's appointment.  Please be sure to schedule follow up at the front  desk before you leave today.   Sabino Dick, DO PGY-2 Family Medicine

## 2020-12-29 ENCOUNTER — Other Ambulatory Visit: Payer: Self-pay

## 2020-12-29 ENCOUNTER — Encounter: Payer: Self-pay | Admitting: Obstetrics and Gynecology

## 2020-12-29 ENCOUNTER — Telehealth: Payer: Self-pay

## 2020-12-29 ENCOUNTER — Ambulatory Visit (INDEPENDENT_AMBULATORY_CARE_PROVIDER_SITE_OTHER): Payer: Medicaid Other | Admitting: Obstetrics and Gynecology

## 2020-12-29 VITALS — BP 133/85 | HR 93 | Ht 66.0 in | Wt 184.0 lb

## 2020-12-29 DIAGNOSIS — R519 Headache, unspecified: Secondary | ICD-10-CM

## 2020-12-29 DIAGNOSIS — O10919 Unspecified pre-existing hypertension complicating pregnancy, unspecified trimester: Secondary | ICD-10-CM

## 2020-12-29 DIAGNOSIS — Z3A01 Less than 8 weeks gestation of pregnancy: Secondary | ICD-10-CM

## 2020-12-29 DIAGNOSIS — Z3491 Encounter for supervision of normal pregnancy, unspecified, first trimester: Secondary | ICD-10-CM

## 2020-12-29 HISTORY — DX: Unspecified pre-existing hypertension complicating pregnancy, unspecified trimester: O10.919

## 2020-12-29 MED ORDER — ASPIRIN EC 81 MG PO TBEC
81.0000 mg | DELAYED_RELEASE_TABLET | Freq: Every day | ORAL | 2 refills | Status: DC
Start: 2020-12-29 — End: 2022-01-12

## 2020-12-29 MED ORDER — CYCLOBENZAPRINE HCL 10 MG PO TABS: 10.0000 mg | ORAL_TABLET | Freq: Three times a day (TID) | ORAL | 2 refills | Status: DC | PRN

## 2020-12-29 MED ORDER — LABETALOL HCL 200 MG PO TABS: 200.0000 mg | ORAL_TABLET | Freq: Two times a day (BID) | ORAL | 10 refills | Status: DC

## 2020-12-29 NOTE — Patient Instructions (Signed)
First Trimester of Pregnancy °The first trimester of pregnancy starts on the first day of your last menstrual period until the end of week 12. This is months 1 through 3 of pregnancy. A week after a sperm fertilizes an egg, the egg will implant into the wall of the uterus and begin to develop into a baby. By the end of 12 weeks, all the baby's organs will be formed and the baby will be 2-3 inches in size. °Body changes during your first trimester °Your body goes through many changes during pregnancy. The changes vary and generally return to normal after your baby is born. °Physical changes °You may gain or lose weight. °Your breasts may begin to grow larger and become tender. The tissue that surrounds your nipples (areola) may become darker. °Dark spots or blotches (chloasma or mask of pregnancy) may develop on your face. °You may have changes in your hair. These can include thickening or thinning of your hair or changes in texture. °Health changes °You may feel nauseous, and you may vomit. °You may have heartburn. °You may develop headaches. °You may develop constipation. °Your gums may bleed and may be sensitive to brushing and flossing. °Other changes °You may tire easily. °You may urinate more often. °Your menstrual periods will stop. °You may have a loss of appetite. °You may develop cravings for certain kinds of food. °You may have changes in your emotions from day to day. °You may have more vivid and strange dreams. °Follow these instructions at home: °Medicines °Follow your health care provider's instructions regarding medicine use. Specific medicines may be either safe or unsafe to take during pregnancy. Do not take any medicines unless told to by your health care provider. °Take a prenatal vitamin that contains at least 600 micrograms (mcg) of folic acid. °Eating and drinking °Eat a healthy diet that includes fresh fruits and vegetables, whole grains, good sources of protein such as meat, eggs, or tofu,  and low-fat dairy products. °Avoid raw meat and unpasteurized juice, milk, and cheese. These carry germs that can harm you and your baby. °If you feel nauseous or you vomit: °Eat 4 or 5 small meals a day instead of 3 large meals. °Try eating a few soda crackers. °Drink liquids between meals instead of during meals. °You may need to take these actions to prevent or treat constipation: °Drink enough fluid to keep your urine pale yellow. °Eat foods that are high in fiber, such as beans, whole grains, and fresh fruits and vegetables. °Limit foods that are high in fat and processed sugars, such as fried or sweet foods. °Activity °Exercise only as directed by your health care provider. Most people can continue their usual exercise routine during pregnancy. Try to exercise for 30 minutes at least 5 days a week. °Stop exercising if you develop pain or cramping in the lower abdomen or lower back. °Avoid exercising if it is very hot or humid or if you are at high altitude. °Avoid heavy lifting. °If you choose to, you may have sex unless your health care provider tells you not to. °Relieving pain and discomfort °Wear a good support bra to relieve breast tenderness. °Rest with your legs elevated if you have leg cramps or low back pain. °If you develop bulging veins (varicose veins) in your legs: °Wear support hose as told by your health care provider. °Elevate your feet for 15 minutes, 3-4 times a day. °Limit salt in your diet. °Safety °Wear your seat belt at all times when driving   or riding in a car. °Talk with your health care provider if someone is verbally or physically abusive to you. °Talk with your health care provider if you are feeling sad or have thoughts of hurting yourself. °Lifestyle °Do not use hot tubs, steam rooms, or saunas. °Do not douche. Do not use tampons or scented sanitary pads. °Do not use herbal remedies, alcohol, illegal drugs, or medicines that are not approved by your health care provider. Chemicals  in these products can harm your baby. °Do not use any products that contain nicotine or tobacco, such as cigarettes, e-cigarettes, and chewing tobacco. If you need help quitting, ask your health care provider. °Avoid cat litter boxes and soil used by cats. These carry germs that can cause birth defects in the baby and possibly loss of the unborn baby (fetus) by miscarriage or stillbirth. °General instructions °During routine prenatal visits in the first trimester, your health care provider will do a physical exam, perform necessary tests, and ask you how things are going. Keep all follow-up visits. This is important. °Ask for help if you have counseling or nutritional needs during pregnancy. Your health care provider can offer advice or refer you to specialists for help with various needs. °Schedule a dentist appointment. At home, brush your teeth with a soft toothbrush. Floss gently. °Write down your questions. Take them to your prenatal visits. °Where to find more information °American Pregnancy Association: americanpregnancy.org °American College of Obstetricians and Gynecologists: acog.org/en/Womens%20Health/Pregnancy °Office on Women's Health: womenshealth.gov/pregnancy °Contact a health care provider if you have: °Dizziness. °A fever. °Mild pelvic cramps, pelvic pressure, or nagging pain in the abdominal area. °Nausea, vomiting, or diarrhea that lasts for 24 hours or longer. °A bad-smelling vaginal discharge. °Pain when you urinate. °Known exposure to a contagious illness, such as chickenpox, measles, Zika virus, HIV, or hepatitis. °Get help right away if you have: °Spotting or bleeding from your vagina. °Severe abdominal cramping or pain. °Shortness of breath or chest pain. °Any kind of trauma, such as from a fall or a car crash. °New or increased pain, swelling, or redness in an arm or leg. °Summary °The first trimester of pregnancy starts on the first day of your last menstrual period until the end of week  12 (months 1 through 3). °Eating 4 or 5 small meals a day rather than 3 large meals may help to relieve nausea and vomiting. °Do not use any products that contain nicotine or tobacco, such as cigarettes, e-cigarettes, and chewing tobacco. If you need help quitting, ask your health care provider. °Keep all follow-up visits. This is important. °This information is not intended to replace advice given to you by your health care provider. Make sure you discuss any questions you have with your health care provider. °Document Revised: 07/09/2019 Document Reviewed: 05/15/2019 °Elsevier Patient Education © 2022 Elsevier Inc. ° °

## 2020-12-29 NOTE — Telephone Encounter (Signed)
Received fax from pharmacy, PA needed on Codeine 30 mg tablets.  Clinical questions submitted via Cover My Meds.  Waiting on response, could take up to 72 hours.  Cover My Meds info: Key: [id number]  Veronda Prude, RN

## 2020-12-29 NOTE — Progress Notes (Signed)
Pt presents today to discuss HTN in pregnancy. She is very early pregnant. Approx. [redacted]w[redacted]d based on dates. She was given Nifedipine 60mg  by PCP initially. Pt also has history of migraines and is concerned about the amount of tylenol because she has consistent headaches.

## 2020-12-30 NOTE — Progress Notes (Signed)
Ms Ju presents to discuss treatment for her CHTN in pregnancy. She 3 5/7 weeks. Currently taking Procardia. Has daily headaches, no visual changes. Takes Tylenol for HA which helps but is concerned that she is taking to much. H/O CHTN since last pregnancy. H/O Migraine HA   PE AF  VSS Lungs clear Heart RRR Abd soft + BS  A/P  Early pregnancy         CHTN  Discussed that HA are probably related to pregnancy hormonal changes, H/O migraine and Procardia. Will stop Procardia and start Labetalol 400 mg po bid.. Start BASA qd. Pt already has New OB intake appt with U/S next month.

## 2020-12-30 NOTE — Telephone Encounter (Signed)
Medication approved through insurance until 06/28/2021. Pharmacy called and informed. LVM for patient regarding medication being approved.   Veronda Prude, RN

## 2021-01-24 ENCOUNTER — Ambulatory Visit (INDEPENDENT_AMBULATORY_CARE_PROVIDER_SITE_OTHER): Payer: Medicaid Other

## 2021-01-24 ENCOUNTER — Other Ambulatory Visit: Payer: Self-pay

## 2021-01-24 VITALS — BP 150/93 | HR 81 | Ht 66.0 in | Wt 189.8 lb

## 2021-01-24 DIAGNOSIS — O0991 Supervision of high risk pregnancy, unspecified, first trimester: Secondary | ICD-10-CM | POA: Diagnosis not present

## 2021-01-24 DIAGNOSIS — O0990 Supervision of high risk pregnancy, unspecified, unspecified trimester: Secondary | ICD-10-CM

## 2021-01-24 DIAGNOSIS — Z3A08 8 weeks gestation of pregnancy: Secondary | ICD-10-CM

## 2021-01-24 DIAGNOSIS — O219 Vomiting of pregnancy, unspecified: Secondary | ICD-10-CM

## 2021-01-24 DIAGNOSIS — O3680X0 Pregnancy with inconclusive fetal viability, not applicable or unspecified: Secondary | ICD-10-CM

## 2021-01-24 MED ORDER — BLOOD PRESSURE KIT DEVI
1.0000 | 0 refills | Status: AC
Start: 2021-01-24 — End: ?

## 2021-01-24 MED ORDER — DOXYLAMINE-PYRIDOXINE 10-10 MG PO TBEC: 2.0000 | DELAYED_RELEASE_TABLET | Freq: Every day | ORAL | 5 refills | Status: DC

## 2021-01-24 MED ORDER — GOJJI WEIGHT SCALE MISC
1.0000 | 0 refills | Status: DC
Start: 2021-01-24 — End: 2021-07-02

## 2021-01-24 NOTE — Addendum Note (Signed)
Addended by: Hamilton Capri on: 01/24/2021 01:11 PM   Modules accepted: Orders

## 2021-01-24 NOTE — Progress Notes (Signed)
Patient was assessed and managed by nursing staff during this encounter. I have reviewed the chart and agree with the documentation and plan. I have also made any necessary editorial changes.  Catalina Antigua, MD 01/24/2021 10:21 AM

## 2021-01-24 NOTE — Progress Notes (Signed)
New OB Intake  I connected with  Michele Gardner on 01/24/21 at  9:15 AM EST by in person and verified that I am speaking with the correct person using two identifiers. Nurse is located at [facility] and pt is located at [facility].  I discussed the limitations, risks, security and privacy concerns of performing an evaluation and management service by telephone and the availability of in person appointments. I also discussed with the patient that there may be a patient responsible charge related to this service. The patient expressed understanding and agreed to proceed.  I explained I am completing New OB Intake today. We discussed her EDD of 09/09/21 that is based on LMP of 12/03/20. Pt is G4/P2012. I reviewed her allergies, medications, Medical/Surgical/OB history, and appropriate screenings. I informed her of [facility] services. Based on history, this is a/an  pregnancy complicated by hypertension .   Patient Active Problem List   Diagnosis Date Noted   Supervision of high risk pregnancy, antepartum 01/24/2021   Chronic hypertension affecting pregnancy 12/29/2020   Currently pregnant in first trimester with unknown gestational age 35/12/2020   Myalgia 10/14/2020   Gastroesophageal reflux disease 10/23/2019   Benign essential hypertension, postpartum 08/25/2016   Migraine 08/25/2016   Hypertension 11/30/2015    Concerns addressed today  Delivery Plans:  Plans to deliver at [facility].   MyChart/Babyscripts MyChart access verified. I explained pt will have some visits in office and some virtually. Babyscripts instructions given and order placed. Patient verifies receipt of registration text/e-mail. Account successfully created and app downloaded.  Blood Pressure Cuff  Blood pressure cuff ordered for patient to pick-up from [employer]. Explained after first prenatal appt pt will check weekly and document in Babyscripts.  Weight scale: Patient does not have weight scale. Weight scale ordered  for patient to pick up form [facility].   Anatomy US Explained first scheduled [place] will be around 19 weeks. Dating and viability scan performed today.  Labs Discussed Avelina Laine genetic screening with patient. Would like both Panorama and Horizon drawn at new OB visit. Routine prenatal labs needed.  Covid Vaccine Patient has not covid vaccine.    Informed patient of [facility] website  and placed link in her AVS.   Social Determinants of Health Food Insecurity: Patient denies food insecurity. WIC Referral: Patient is interested in referral to [facility].  Transportation: Patient denies transportation needs. Childcare: Discussed no children allowed at ultrasound appointments. Offered childcare services; patient declines childcare services at this time.  Send link to Pregnancy Navigators   Placed OB Box on problem list and updated  First visit review I reviewed new OB appt with pt. I explained she will have a pelvic exam, ob bloodwork with genetic screening, and PAP smear. Explained pt will be seen by Tinnie Gens at first visit; encounter routed to appropriate provider. Explained that patient will be seen by pregnancy navigator following visit with provider. [facility] information placed in AVS.   Hamilton Capri, RN 01/24/2021  9:46 AM

## 2021-02-01 ENCOUNTER — Other Ambulatory Visit: Payer: Self-pay | Admitting: *Deleted

## 2021-02-01 DIAGNOSIS — O0990 Supervision of high risk pregnancy, unspecified, unspecified trimester: Secondary | ICD-10-CM

## 2021-02-01 NOTE — Progress Notes (Signed)
BabyRx reordered due to pt unable to get signed up.

## 2021-02-08 ENCOUNTER — Other Ambulatory Visit (HOSPITAL_COMMUNITY)
Admission: RE | Admit: 2021-02-08 | Discharge: 2021-02-08 | Disposition: A | Discharge status: Home / Self Care | Payer: Medicaid Other | Source: Ambulatory Visit | Attending: Family Medicine | Admitting: Family Medicine

## 2021-02-08 ENCOUNTER — Ambulatory Visit (INDEPENDENT_AMBULATORY_CARE_PROVIDER_SITE_OTHER): Payer: Medicaid Other | Admitting: Family Medicine

## 2021-02-08 ENCOUNTER — Telehealth: Payer: Self-pay

## 2021-02-08 ENCOUNTER — Encounter: Payer: Self-pay | Admitting: Family Medicine

## 2021-02-08 ENCOUNTER — Other Ambulatory Visit: Payer: Self-pay

## 2021-02-08 VITALS — BP 139/95 | HR 80 | Wt 193.0 lb

## 2021-02-08 DIAGNOSIS — Z23 Encounter for immunization: Secondary | ICD-10-CM

## 2021-02-08 DIAGNOSIS — O0990 Supervision of high risk pregnancy, unspecified, unspecified trimester: Secondary | ICD-10-CM | POA: Insufficient documentation

## 2021-02-08 DIAGNOSIS — Z3A09 9 weeks gestation of pregnancy: Secondary | ICD-10-CM

## 2021-02-08 DIAGNOSIS — I10 Essential (primary) hypertension: Secondary | ICD-10-CM

## 2021-02-08 DIAGNOSIS — O10919 Unspecified pre-existing hypertension complicating pregnancy, unspecified trimester: Secondary | ICD-10-CM

## 2021-02-08 DIAGNOSIS — O0991 Supervision of high risk pregnancy, unspecified, first trimester: Secondary | ICD-10-CM | POA: Diagnosis not present

## 2021-02-08 DIAGNOSIS — O10911 Unspecified pre-existing hypertension complicating pregnancy, first trimester: Secondary | ICD-10-CM | POA: Diagnosis not present

## 2021-02-08 NOTE — Assessment & Plan Note (Addendum)
On HCTZ 25 mg

## 2021-02-08 NOTE — Progress Notes (Signed)
Subjective:   Michele Gardner is a 35 y.o. [id number] at 15w4dby LMP, early ultrasound being seen today for her first obstetrical visit.  Her obstetrical history is significant for  CHTN on Labetalol . Pregnancy history fully reviewed.  Patient reports heartburn and nausea.  HISTORY: OB History  Gravida Para Term Preterm AB Living  _0 0 1 2  SAB IAB Ectopic Multiple Live Births  0 1 0 0 2    # Outcome Date GA Lbr Len/2nd Weight Sex Delivery Anes PTL Lv  4 Current           3 Term 11/30/15 467w1d2:18 / 00:03 8 lb 6.2 oz (3.805 kg) F Vag-Spont None  LIV     Birth Comments: Facial bruising noted     Name: TICYNDEL, GRIFFEY   Apgar1: 8  Apgar5: 9  2 IAB 2015          1 Term 12/05/07 3854w0d lb 14 oz (3.118 kg) M Vag-Spont None N LIV     Birth Comments: System Generated. Please review and update pregnancy details.   Last pap smear was  08/08/2019 and was normal Past Medical History:  Diagnosis Date   Asthma    Hypertension    Medical history non-contributory    Past Surgical History:  Procedure Laterality Date   NO PAST SURGERIES     WISDOM TOOTH EXTRACTION  2007   Family History  Problem Relation Age of Onset   Hypertension Mother    Hyperlipidemia Mother    Melanoma Father    Alcohol abuse Father    Cancer Maternal Grandfather    Diabetes Paternal Grandmother    Cancer Paternal Grandmother    Diabetes Paternal Grandfather    Brain cancer Other    Social History   Tobacco Use   Smoking status: Former    Packs/day: 0.50    Types: Cigarettes    Quit date: 04/23/2015    Years since quitting: 5.8   Smokeless tobacco: Never  Vaping Use   Vaping Use: Never used  Substance Use Topics   Alcohol use: No   Drug use: No   Allergies  Allergen Reactions   Bee Venom Anaphylaxis   Current Outpatient Medications on File Prior to Visit  Medication Sig Dispense Refill   aspirin EC 81 MG tablet Take 1 tablet (81 mg total) by mouth daily. Take after 12 weeks for  prevention of preeclampsia later in pregnancy (Patient not taking: Reported on 01/24/2021) 300 tablet 2   Blood Pressure Monitoring (BLOOD PRESSURE KIT) DEVI 1 kit by Does not apply route once a week. 1 each 0   cyclobenzaprine (FLEXERIL) 10 MG tablet Take 1 tablet (10 mg total) by mouth 3 (three) times daily as needed for muscle spasms. 30 tablet 2   labetalol (NORMODYNE) 200 MG tablet Take 1 tablet (200 mg total) by mouth 2 (two) times daily. 60 tablet 10   Misc. Devices (GOJJI WEIGHT SCALE) MISC 1 Device by Does not apply route every 30 (thirty) days. 1 each 0   No current facility-administered medications on file prior to visit.     Exam   Vitals:   02/08/21 0926 02/08/21 0939  BP: (!) 143/95 (!) 139/95  Pulse: 85 80  Weight: 193 lb (87.5 kg)    Fetal Heart Rate (bpm): 162  Uterus:   10 wk size  Pelvic Exam: Perineum: no hemorrhoids, normal perineum   Vulva: normal external genitalia,  no lesions   Vagina:  normal mucosa, normal discharge   Cervix: no lesions and normal, pap smear done.    Adnexa: normal adnexa and no mass, fullness, tenderness   Bony Pelvis: average  System: General: well-developed, well-nourished female in no acute distress   Breast:  normal appearance, no masses or tenderness   Skin: normal coloration and turgor, no rashes   Neurologic: oriented, normal, negative, normal mood   Extremities: normal strength, tone, and muscle mass, ROM of all joints is normal   HEENT PERRLA, extraocular movement intact and sclera clear, anicteric   Mouth/Teeth mucous membranes moist, pharynx normal without lesions and dental hygiene good   Neck supple and no masses   Cardiovascular: regular rate and rhythm   Respiratory:  no respiratory distress, normal breath sounds   Abdomen: soft, non-tender; bowel sounds normal; no masses,  no organomegaly     Assessment:   Pregnancy: [id number] Patient Active Problem List   Diagnosis Date Noted   Supervision of high risk pregnancy,  antepartum 01/24/2021   Chronic hypertension affecting pregnancy 12/29/2020   Myalgia 10/14/2020   Gastroesophageal reflux disease 10/23/2019   Benign essential hypertension 08/25/2016   Migraine with aura and without status migrainosus, not intractable 08/25/2016     Plan:  1. Supervision of high risk pregnancy, antepartum New OB labs Genetics screening deferred due to GA - Obstetric Panel, Including HIV - Hepatitis C Antibody - Cervicovaginal ancillary only( [place]) - Culture, OB Urine - [place] MFM OB DETAIL +14 WK; Future - Flu Vaccine QUAD 36+ mos IM (Fluarix, Quad PF)  2. Chronic hypertension affecting pregnancy Baseline labs Begin ASA at 12 weeks Continue Labetalol for BP control - Hemoglobin A1c - Comprehensive metabolic panel - TSH - Protein / creatinine ratio, urine  3. Benign essential hypertension DASH diet   Initial labs drawn. Continue prenatal vitamins. Genetic Screening discussed, NIPS:  needs to be drawn at next visit . Ultrasound discussed; fetal anatomic survey: ordered. Problem list reviewed and updated.  The nature of [facility] [place] with multiple MDs and other Advanced Practice Providers was explained to patient; also emphasized that residents, students are part of our team. Routine obstetric precautions reviewed. Return in 4 weeks (on 03/08/2021).
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
## 2021-02-08 NOTE — Patient Instructions (Signed)
First Trimester of Pregnancy °The first trimester of pregnancy starts on the first day of your last menstrual period until the end of week 12. This is months 1 through 3 of pregnancy. A week after a sperm fertilizes an egg, the egg will implant into the wall of the uterus and begin to develop into a baby. By the end of 12 weeks, all the baby's organs will be formed and the baby will be 2-3 inches in size. °Body changes during your first trimester °Your body goes through many changes during pregnancy. The changes vary and generally return to normal after your baby is born. °Physical changes °You may gain or lose weight. °Your breasts may begin to grow larger and become tender. The tissue that surrounds your nipples (areola) may become darker. °Dark spots or blotches (chloasma or mask of pregnancy) may develop on your face. °You may have changes in your hair. These can include thickening or thinning of your hair or changes in texture. °Health changes °You may feel nauseous, and you may vomit. °You may have heartburn. °You may develop headaches. °You may develop constipation. °Your gums may bleed and may be sensitive to brushing and flossing. °Other changes °You may tire easily. °You may urinate more often. °Your menstrual periods will stop. °You may have a loss of appetite. °You may develop cravings for certain kinds of food. °You may have changes in your emotions from day to day. °You may have more vivid and strange dreams. °Follow these instructions at home: °Medicines °Follow your health care provider's instructions regarding medicine use. Specific medicines may be either safe or unsafe to take during pregnancy. Do not take any medicines unless told to by your health care provider. °Take a prenatal vitamin that contains at least 600 micrograms (mcg) of folic acid. °Eating and drinking °Eat a healthy diet that includes fresh fruits and vegetables, whole grains, good sources of protein such as meat, eggs, or tofu,  and low-fat dairy products. °Avoid raw meat and unpasteurized juice, milk, and cheese. These carry germs that can harm you and your baby. °If you feel nauseous or you vomit: °Eat 4 or 5 small meals a day instead of 3 large meals. °Try eating a few soda crackers. °Drink liquids between meals instead of during meals. °You may need to take these actions to prevent or treat constipation: °Drink enough fluid to keep your urine pale yellow. °Eat foods that are high in fiber, such as beans, whole grains, and fresh fruits and vegetables. °Limit foods that are high in fat and processed sugars, such as fried or sweet foods. °Activity °Exercise only as directed by your health care provider. Most people can continue their usual exercise routine during pregnancy. Try to exercise for 30 minutes at least 5 days a week. °Stop exercising if you develop pain or cramping in the lower abdomen or lower back. °Avoid exercising if it is very hot or humid or if you are at high altitude. °Avoid heavy lifting. °If you choose to, you may have sex unless your health care provider tells you not to. °Relieving pain and discomfort °Wear a good support bra to relieve breast tenderness. °Rest with your legs elevated if you have leg cramps or low back pain. °If you develop bulging veins (varicose veins) in your legs: °Wear support hose as told by your health care provider. °Elevate your feet for 15 minutes, 3-4 times a day. °Limit salt in your diet. °Safety °Wear your seat belt at all times when driving   or riding in a car. °Talk with your health care provider if someone is verbally or physically abusive to you. °Talk with your health care provider if you are feeling sad or have thoughts of hurting yourself. °Lifestyle °Do not use hot tubs, steam rooms, or saunas. °Do not douche. Do not use tampons or scented sanitary pads. °Do not use herbal remedies, alcohol, illegal drugs, or medicines that are not approved by your health care provider. Chemicals  in these products can harm your baby. °Do not use any products that contain nicotine or tobacco, such as cigarettes, e-cigarettes, and chewing tobacco. If you need help quitting, ask your health care provider. °Avoid cat litter boxes and soil used by cats. These carry germs that can cause birth defects in the baby and possibly loss of the unborn baby (fetus) by miscarriage or stillbirth. °General instructions °During routine prenatal visits in the first trimester, your health care provider will do a physical exam, perform necessary tests, and ask you how things are going. Keep all follow-up visits. This is important. °Ask for help if you have counseling or nutritional needs during pregnancy. Your health care provider can offer advice or refer you to specialists for help with various needs. °Schedule a dentist appointment. At home, brush your teeth with a soft toothbrush. Floss gently. °Write down your questions. Take them to your prenatal visits. °Where to find more information °American Pregnancy Association: americanpregnancy.org °American College of Obstetricians and Gynecologists: acog.org/en/Womens%20Health/Pregnancy °Office on Women's Health: womenshealth.gov/pregnancy °Contact a health care provider if you have: °Dizziness. °A fever. °Mild pelvic cramps, pelvic pressure, or nagging pain in the abdominal area. °Nausea, vomiting, or diarrhea that lasts for 24 hours or longer. °A bad-smelling vaginal discharge. °Pain when you urinate. °Known exposure to a contagious illness, such as chickenpox, measles, Zika virus, HIV, or hepatitis. °Get help right away if you have: °Spotting or bleeding from your vagina. °Severe abdominal cramping or pain. °Shortness of breath or chest pain. °Any kind of trauma, such as from a fall or a car crash. °New or increased pain, swelling, or redness in an arm or leg. °Summary °The first trimester of pregnancy starts on the first day of your last menstrual period until the end of week  12 (months 1 through 3). °Eating 4 or 5 small meals a day rather than 3 large meals may help to relieve nausea and vomiting. °Do not use any products that contain nicotine or tobacco, such as cigarettes, e-cigarettes, and chewing tobacco. If you need help quitting, ask your health care provider. °Keep all follow-up visits. This is important. °This information is not intended to replace advice given to you by your health care provider. Make sure you discuss any questions you have with your health care provider. °Document Revised: 07/09/2019 Document Reviewed: 05/15/2019 °Elsevier Patient Education © 2022 Elsevier Inc. ° °

## 2021-02-08 NOTE — Progress Notes (Addendum)
NEW OB, BP elevated, she is taking Labetalol but she did not take it this Morning. Will do Genetic Screening next visit.

## 2021-02-09 LAB — CERVICOVAGINAL ANCILLARY ONLY
Chlamydia: NEGATIVE
Comment: NEGATIVE
Comment: NORMAL
Neisseria Gonorrhea: NEGATIVE

## 2021-02-09 LAB — PROTEIN / CREATININE RATIO, URINE
Creatinine, Urine: 273.3 mg/dL
Protein, Ur: 21.5 mg/dL
Protein/Creat Ratio: 79 mg/g creat (ref 0–200)

## 2021-02-10 LAB — URINE CULTURE, OB REFLEX

## 2021-02-10 LAB — CULTURE, OB URINE

## 2021-02-13 NOTE — L&D Delivery Note (Addendum)
OB/GYN Faculty Practice Delivery Note  Michele Gardner is a 36 y.o. [phone] at [redacted]w[redacted]d. She was admitted for IOL for cHTN with SIPE w/ severe features (BP and HA) on IV magnesium.   ROM: 10h 19m with clear fluid GBS Status: unknown with 5 doses of penicillin prior to delivery   Labor Progress: Patient presented for IOL for cHTN with SIPE (BP and HA). Cytotec X 1 was used until patient dilated enough for Cooks catheter + Pit. When Cooks cath fell out, patient was found to be 5 cm and AROMed with clear fluid without complication. Patient had worsening pain and received epidural which managed her pain until delivery.   Delivery Date/Time: [phone] Delivery: Called to room and patient was complete and pushing. Head delivered LOA. Loose nuchal chord X 1 was easily reduced. Shoulder and body delivered in usual fashion. Infant with spontaneous cry, placed on mother's abdomen, dried and stimulated. Delivery attended by Raelyn Mora, CNM, but remainder below supervised by Dr. Annia Friendly. Cord clamped x 2 after 1-minute delay, and cut by FOB under my direct supervision. Cord blood drawn. Placenta delivered spontaneously with gentle cord traction. Fundus firm with massage, lower uterine sweep, and Pitocin. Labia, perineum, vagina, and cervix were inspected, and very small left periurethral laceration was identified and hemostatic/not repaired. Prophylactic TXA given for increased PPH risk.  Placenta: 3 vessel cord noted. Placenta intact.  Complications: None Lacerations: L periurethral first degree not requiring suture repair EBL: 100 cc Analgesia: epidural   Infant: girl ([place])  APGARs 6/7/9  3090 g  Gilles Chiquito, MD PGY-2   ATTESTATION  I was present, gloved, and supervising throughout the delivery and agree with above documentation in the resident's note.  Allayne Stack, DO OB Fellow  [facility] [employer] (Faculty Practice) 08/09/2021, 4:41 PM

## 2021-02-15 ENCOUNTER — Telehealth: Payer: Self-pay

## 2021-02-15 NOTE — Telephone Encounter (Signed)
Patient called stating that she can not get her bp under control and that she can not get rid of her headache. She states that she spoke with the provider on call over the weekend and was told to increase her labetalol to 200 mg three times per day and to take tylenol for her headache. Patient reports that her bp today is 145/97 this morning and 147/98 at time of call.  Discussed with Dr. Shawnie Pons who recommends patient take labetalol 400 mg twice a day and to do one dose of 600 mg ibuprofen for her headache.  Patient verbalized understanding.

## 2021-02-17 ENCOUNTER — Encounter: Payer: Self-pay | Admitting: Obstetrics and Gynecology

## 2021-02-17 ENCOUNTER — Ambulatory Visit (INDEPENDENT_AMBULATORY_CARE_PROVIDER_SITE_OTHER): Payer: Medicaid Other | Admitting: Obstetrics and Gynecology

## 2021-02-17 ENCOUNTER — Other Ambulatory Visit: Payer: Self-pay

## 2021-02-17 VITALS — BP 145/89 | HR 86 | Wt 195.0 lb

## 2021-02-17 DIAGNOSIS — Z1329 Encounter for screening for other suspected endocrine disorder: Secondary | ICD-10-CM | POA: Diagnosis not present

## 2021-02-17 DIAGNOSIS — O0990 Supervision of high risk pregnancy, unspecified, unspecified trimester: Secondary | ICD-10-CM | POA: Diagnosis not present

## 2021-02-17 DIAGNOSIS — O0991 Supervision of high risk pregnancy, unspecified, first trimester: Secondary | ICD-10-CM | POA: Diagnosis not present

## 2021-02-17 DIAGNOSIS — Z131 Encounter for screening for diabetes mellitus: Secondary | ICD-10-CM | POA: Diagnosis not present

## 2021-02-17 DIAGNOSIS — O10919 Unspecified pre-existing hypertension complicating pregnancy, unspecified trimester: Secondary | ICD-10-CM

## 2021-02-17 DIAGNOSIS — Z3A10 10 weeks gestation of pregnancy: Secondary | ICD-10-CM | POA: Diagnosis not present

## 2021-02-17 MED ORDER — LABETALOL HCL 200 MG PO TABS: 400.0000 mg | ORAL_TABLET | Freq: Two times a day (BID) | ORAL | 3 refills | Status: DC

## 2021-02-17 NOTE — Progress Notes (Signed)
Pt had elevated BP at home of 141/100. Pt states she has frequent HA/migraine.  Pt states she is taking Labetalol daily- total of 800mg .

## 2021-02-17 NOTE — Progress Notes (Signed)
° °  PRENATAL VISIT NOTE  Subjective:  Michele Gardner is a 36 y.o. [phone] at [redacted]w[redacted]d being seen today for ongoing prenatal care.  She is currently monitored for the following issues for this high-risk pregnancy and has Benign essential hypertension; Migraine with aura and without status migrainosus, not intractable; Gastroesophageal reflux disease; Myalgia; Chronic hypertension affecting pregnancy; and Supervision of high risk pregnancy, antepartum on their problem list.  Patient reports headache.  Contractions: Not present. Vag. Bleeding: None.   . Denies leaking of fluid.   The following portions of the patient's history were reviewed and updated as appropriate: allergies, current medications, past family history, past medical history, past social history, past surgical history and problem list.   Objective:   Vitals:   02/17/21 1107  BP: (!) 145/89  Pulse: 86  Weight: 195 lb (88.5 kg)    Fetal Status: Fetal Heart Rate (bpm): 160         General:  Alert, oriented and cooperative. Patient is in no acute distress.  Skin: Skin is warm and dry. No rash noted.   Cardiovascular: Normal heart rate noted  Respiratory: Normal respiratory effort, no problems with respiration noted  Abdomen: Soft, gravid, appropriate for gestational age.  Pain/Pressure: Absent     Pelvic: Cervical exam deferred        Extremities: Normal range of motion.     Mental Status: Normal mood and affect. Normal behavior. Normal judgment and thought content.   Assessment and Plan:  Pregnancy: [id number] at [redacted]w[redacted]d 1. Supervision of high risk pregnancy, antepartum Patient doing well reporting a daily headache. Patient has a history of migraine headaches She does not like taking tylenol daily Will refer to Michele Gardner- headache specialist Anatomy ultrasound scheduled in March  2. Chronic hypertension affecting pregnancy BP stable on labetalol 400 BID Patient knows to start ASA at 12 weeks  Preterm labor symptoms  and general obstetric precautions including but not limited to vaginal bleeding, contractions, leaking of fluid and fetal movement were reviewed in detail with the patient. Please refer to After Visit Summary for other counseling recommendations.   No follow-ups on file.  Future Appointments  Date Time Provider [place]  03/08/2021 10:35 AM Woodroe Mode, MD [place] None  04/15/2021  8:30 AM [facility]  04/15/2021  8:45 AM [facility] US5 [facility]    Mora Bellman, MD

## 2021-02-17 NOTE — Addendum Note (Signed)
Addended by: Lewie Loron D on: 02/17/2021 12:19 PM   Modules accepted: Orders

## 2021-02-18 ENCOUNTER — Encounter: Payer: Medicaid Other | Attending: Obstetrics and Gynecology | Admitting: Registered"

## 2021-02-18 ENCOUNTER — Encounter: Payer: Self-pay | Admitting: Registered"

## 2021-02-18 DIAGNOSIS — O10919 Unspecified pre-existing hypertension complicating pregnancy, unspecified trimester: Secondary | ICD-10-CM | POA: Diagnosis present

## 2021-02-18 NOTE — Patient Instructions (Addendum)
Include a variety of fruits and vegetables daily Whole grains Continue avoid seasonings with high sodium Continue watching labels   Stress management including exercise 3-5 x/week, getting outside Good quality sleep, try guided sleep meditation   Postpartum and after done breastfeeding: Add Ground flaxseed to yogurt, oatmeal other foods. Start with 1 tablespoon and can work up to 2 tablespoons

## 2021-02-18 NOTE — Progress Notes (Signed)
Medical Nutrition Therapy  Appointment Start time:  [phone]  Appointment End time:  1100  Primary concerns today: high blood pressure  Referral diagnosis: 010.919 Chronic HTN affecting pregnancy Preferred learning style: no preference indicated Learning readiness: ready, change in progress   NUTRITION ASSESSMENT   Anthropometrics  Wt Readings from Last 3 Encounters:  02/17/21 195 lb (88.5 kg)  02/08/21 193 lb (87.5 kg)  01/24/21 189 lb 12.8 oz (86.1 kg)      Clinical Medical Hx: Migraine, HTN since last pregnancy Medications: reviewed Labs: reviewed Notable Signs/Symptoms: migraines increase with increased blood pressure, swelling in hands and ankles  Lifestyle & Dietary Hx Pt states she would like to manage HTN without medication. Pt states her diet is limited due to pregnancy related heartburn and nausea.  Pt states they have 5 kids at home, youngest is 2 yrs old, and keep a lot of healthy options available.  Pt states she does not use salt or other seasonings that include sodium at home. Patient reads food labels for sodium content and does not eat out often and does not use canned foods. Occasional fries with salt to help with nausea.  Pt reports history of trying multiple medications for HTN and some were contraindicated due to effect on her chronic migraines and was on Hydrochlorothiazide until current pregnancy.   Pt states she is active at work but doesn't get a lot of other structured activity. Pt states she realizes the effect of stress, pt states she is tired due to HTN and sleeps a lot.  Estimated daily fluid intake: 100 oz Supplements: prenatal Sleep: "all the time" Stress / self-care: not assessed Current average weekly physical activity: ADL  24-Hr Dietary Recall First Meal: eggs, sometime with bread Snack: fruit Second Meal: sometimes has a small Gatorade with lunch Snack: yogurt, granola Third Meal: salad, baked chicken Snack: sun chips  ~2x/week Beverages: water, juice (wic), Gatorade   NUTRITION DIAGNOSIS  NB-1.1 Food and nutrition-related knowledge deficit As related to hidden sources of sodium.  As evidenced by stated new learning that fresh chicken is injected with sodium solution and grilled options at restaurants tends to be very high in sodium.   NUTRITION INTERVENTION  Nutrition education (E-1) on the following topics:  Sources of sodium Importance of fruits, vegetables, whole grains in BP control Role of exercise & stress management  Handouts Provided Include  none  Learning Style & Readiness for Change Teaching method utilized: Visual & Auditory  Demonstrated degree of understanding via: Teach Back  Barriers to learning/adherence to lifestyle change: none  Goals Established by Pt Include a variety of fruits and vegetables daily Whole grains Continue avoid seasonings with high sodium Continue watching labels   Stress management including exercise 3-5 x/week, getting outside Good quality sleep, try guided sleep meditation   Postpartum and after done breastfeeding: Add Ground flaxseed to yogurt, oatmeal other foods. Start with 1 tablespoon and can work up to 2 tablespoons   MONITORING & EVALUATION Dietary intake, weekly physical activity, and blood pressure in PRN.  Next Steps  Patient is to return for follow-up if desired.

## 2021-02-21 ENCOUNTER — Telehealth: Payer: Self-pay

## 2021-02-21 ENCOUNTER — Other Ambulatory Visit: Payer: Self-pay | Admitting: Obstetrics and Gynecology

## 2021-02-21 MED ORDER — AZITHROMYCIN 250 MG PO TABS: ORAL_TABLET | ORAL | 1 refills | Status: DC

## 2021-02-21 NOTE — Telephone Encounter (Signed)
Pt reports possible sinus infection since Friday. She said she is proned to them. She is swallowing blood, blowing out yellow mucous, and has a migraine. BP 142/104 and she is taking Labetalol as instructed. Consulted with MD, Zpack sent to the pharmacy. Pt agrees to bring BP cuff to check for accuracy next visit.

## 2021-02-24 LAB — COMPREHENSIVE METABOLIC PANEL
ALT: 72 IU/L — ABNORMAL HIGH (ref 0–32)
AST: 76 IU/L — ABNORMAL HIGH (ref 0–40)
Albumin/Globulin Ratio: 1.7 (ref 1.2–2.2)
Albumin: 4 g/dL (ref 3.8–4.8)
Alkaline Phosphatase: 60 IU/L (ref 44–121)
BUN/Creatinine Ratio: 17 (ref 9–23)
BUN: 9 mg/dL (ref 6–20)
Bilirubin Total: 0.3 mg/dL (ref 0.0–1.2)
CO2: 20 mmol/L (ref 20–29)
Calcium: 9.1 mg/dL (ref 8.7–10.2)
Chloride: 102 mmol/L (ref 96–106)
Creatinine, Ser: 0.53 mg/dL — ABNORMAL LOW (ref 0.57–1.00)
Globulin, Total: 2.3 g/dL (ref 1.5–4.5)
Glucose: 76 mg/dL (ref 70–99)
Potassium: 4.4 mmol/L (ref 3.5–5.2)
Sodium: 136 mmol/L (ref 134–144)
Total Protein: 6.3 g/dL (ref 6.0–8.5)
eGFR: 125 mL/min/{1.73_m2} (ref >59)

## 2021-02-24 LAB — CBC/D/PLT+RPR+RH+ABO+RUBIGG...
Antibody Screen: NEGATIVE
Basophils Absolute: 0.1 10*3/uL (ref 0.0–0.2)
Basos: 1 %
EOS (ABSOLUTE): 0.2 10*3/uL (ref 0.0–0.4)
Eos: 3 %
HCV Ab: <0.1 s/co ratio (ref 0.0–0.9)
HIV Screen 4th Generation wRfx: NONREACTIVE
Hematocrit: 38.1 % (ref 34.0–46.6)
Hemoglobin: 12.7 g/dL (ref 11.1–15.9)
Hepatitis B Surface Ag: NEGATIVE
Immature Grans (Abs): 0.1 10*3/uL (ref 0.0–0.1)
Immature Granulocytes: 1 %
Lymphocytes Absolute: 1 10*3/uL (ref 0.7–3.1)
Lymphs: 11 %
MCH: 30.5 pg (ref 26.6–33.0)
MCHC: 33.3 g/dL (ref 31.5–35.7)
MCV: 91 fL (ref 79–97)
Monocytes Absolute: 0.7 10*3/uL (ref 0.1–0.9)
Monocytes: 8 %
Neutrophils Absolute: 7.6 10*3/uL — ABNORMAL HIGH (ref 1.4–7.0)
Neutrophils: 76 %
Platelets: 272 10*3/uL (ref 150–450)
RBC: 4.17 x10E6/uL (ref 3.77–5.28)
RDW: 13 % (ref 11.7–15.4)
RPR Ser Ql: NONREACTIVE
Rh Factor: POSITIVE
Rubella Antibodies, IGG: 1.08 index (ref >0.99)
WBC: 9.7 10*3/uL (ref 3.4–10.8)

## 2021-02-24 LAB — HCV INTERPRETATION

## 2021-02-24 LAB — TSH: TSH: 0.014 u[IU]/mL — ABNORMAL LOW (ref 0.450–4.500)

## 2021-02-24 LAB — HEMOGLOBIN A1C
Est. average glucose Bld gHb Est-mCnc: 108 mg/dL
Hgb A1c MFr Bld: 5.4 % (ref 4.8–5.6)

## 2021-03-08 ENCOUNTER — Ambulatory Visit (INDEPENDENT_AMBULATORY_CARE_PROVIDER_SITE_OTHER): Payer: Medicaid Other | Admitting: Obstetrics & Gynecology

## 2021-03-08 ENCOUNTER — Other Ambulatory Visit: Payer: Self-pay

## 2021-03-08 VITALS — BP 127/83 | HR 85 | Wt 198.0 lb

## 2021-03-08 DIAGNOSIS — Z3480 Encounter for supervision of other normal pregnancy, unspecified trimester: Secondary | ICD-10-CM | POA: Diagnosis not present

## 2021-03-08 DIAGNOSIS — O0990 Supervision of high risk pregnancy, unspecified, unspecified trimester: Secondary | ICD-10-CM

## 2021-03-08 DIAGNOSIS — Z3A13 13 weeks gestation of pregnancy: Secondary | ICD-10-CM

## 2021-03-08 DIAGNOSIS — Z3482 Encounter for supervision of other normal pregnancy, second trimester: Secondary | ICD-10-CM | POA: Diagnosis not present

## 2021-03-08 DIAGNOSIS — R7989 Other specified abnormal findings of blood chemistry: Secondary | ICD-10-CM

## 2021-03-08 DIAGNOSIS — O10919 Unspecified pre-existing hypertension complicating pregnancy, unspecified trimester: Secondary | ICD-10-CM

## 2021-03-08 DIAGNOSIS — O99281 Endocrine, nutritional and metabolic diseases complicating pregnancy, first trimester: Secondary | ICD-10-CM | POA: Diagnosis not present

## 2021-03-08 DIAGNOSIS — Z31430 Encounter of female for testing for genetic disease carrier status for procreative management: Secondary | ICD-10-CM | POA: Diagnosis not present

## 2021-03-08 MED ORDER — LABETALOL HCL 200 MG PO TABS: 400.0000 mg | ORAL_TABLET | Freq: Two times a day (BID) | ORAL | 3 refills | Status: DC

## 2021-03-08 NOTE — Progress Notes (Deleted)
Subjective:    Michele Gardner is a 36 y.o. [id number] [redacted]w[redacted]d being seen today for her obstetrical visit.  Patient reports {sx:14538}. Fetal movement: {fetal movement:14572}.  Objective:    BP 127/83    Pulse 85    Wt 198 lb (89.8 kg)    LMP 12/03/2020 (Approximate)    BMI 31.96 kg/m   Physical Exam  Exam  FHT: Fetal Heart Rate (bpm): 150  Uterine Size:    Presentation:       Assessment:    Pregnancy:  [id number]    Plan:    Patient Active Problem List   Diagnosis Date Noted   Abnormal TSH 03/08/2021   Supervision of high risk pregnancy, antepartum 01/24/2021   Chronic hypertension affecting pregnancy 12/29/2020   Myalgia 10/14/2020   Gastroesophageal reflux disease 10/23/2019   Benign essential hypertension 08/25/2016   Migraine with aura and without status migrainosus, not intractable 08/25/2016    {ob counseling:14517} Follow up in {follow up OB:14565}.    {*** HELP TEXT ***  This SmartLink requires parameters for processing. Parameters allow for more information to be given to the SmartLink. This allows you to request specific information by giving the SmartLink precise direction.  The SmartLink accepts a list of result component base names separated by commas. You can also request the number of results to display for each component. To indicate the number of results for each component, type the component name followed by a colon and then the number of results (Name:4). For all results for that particular component, type a star in place of the number (Name:*). To obtain the first and last results for a particular component, type FL in place of the number or the star (Name:FL). To obtain the last result for a particular component, type the component name without a colon, number, or star.  Example: .LASTLAB[MCH:3,MCV:*,HCT }

## 2021-03-08 NOTE — Progress Notes (Signed)
° °  PRENATAL VISIT NOTE  Subjective:  Michele Gardner is a 36 y.o. [phone] at [redacted]w[redacted]d being seen today for ongoing prenatal care.  She is currently monitored for the following issues for this high-risk pregnancy and has Benign essential hypertension; Migraine with aura and without status migrainosus, not intractable; Gastroesophageal reflux disease; Myalgia; Chronic hypertension affecting pregnancy; Supervision of high risk pregnancy, antepartum; and Abnormal TSH on their problem list.  Patient reports no complaints.  Contractions: Not present. Vag. Bleeding: None.   . Denies leaking of fluid.   The following portions of the patient's history were reviewed and updated as appropriate: allergies, current medications, past family history, past medical history, past social history, past surgical history and problem list.   Objective:   Vitals:   03/08/21 1040  BP: 127/83  Pulse: 85  Weight: 198 lb (89.8 kg)    Fetal Status: Fetal Heart Rate (bpm): 150         General:  Alert, oriented and cooperative. Patient is in no acute distress.  Skin: Skin is warm and dry. No rash noted.   Cardiovascular: Normal heart rate noted  Respiratory: Normal respiratory effort, no problems with respiration noted  Abdomen: Soft, gravid, appropriate for gestational age.  Pain/Pressure: Absent     Pelvic: Cervical exam deferred        Extremities: Normal range of motion.     Mental Status: Normal mood and affect. Normal behavior. Normal judgment and thought content.   Assessment and Plan:  Pregnancy: [id number] at [redacted]w[redacted]d 1. Supervision of high risk pregnancy, antepartum F/u abnl TSH - T3, free - T4, free - Genetic Screening  2. Chronic hypertension affecting pregnancy Continue medication - labetalol (NORMODYNE) 200 MG tablet; Take 2 tablets (400 mg total) by mouth 2 (two) times daily.  Dispense: 120 tablet; Refill: 3  3. Abnormal TSH Orders Placed This Encounter  Procedures   T3, free   T4, free    Genetic Screening     Preterm labor symptoms and general obstetric precautions including but not limited to vaginal bleeding, contractions, leaking of fluid and fetal movement were reviewed in detail with the patient. Please refer to After Visit Summary for other counseling recommendations.   Return in about 4 weeks (around 04/05/2021).  Future Appointments  Date Time Provider [place]  04/05/2021 10:35 AM Constant, Vickii Chafe, MD [place] None  04/15/2021  8:30 AM [facility] NURSE [facility]  04/15/2021  8:45 AM [facility] US5 [facility]  04/29/2021  9:30 AM [place] Bobbye Morton, PA-C [facility]    Emeterio Reeve, MD

## 2021-03-08 NOTE — Progress Notes (Signed)
Pt states that she took a Zpak recently and still having symptoms.

## 2021-03-09 ENCOUNTER — Telehealth: Payer: Self-pay | Admitting: *Deleted

## 2021-03-09 LAB — T3, FREE: T3, Free: 3.6 pg/mL (ref 2.0–4.4)

## 2021-03-09 LAB — T4, FREE: Free T4: 1.08 ng/dL (ref 0.82–1.77)

## 2021-03-09 NOTE — Telephone Encounter (Signed)
TC from patient reporting migraine x 3 days, unresponsive to tylenol. Appt with HA specialist is a few weeks out. Advised taking tylenol with caffeine and resting in a dark, quiet room. Advised to seek care in MAU for worsening HA, visual changes or other emergent concerns. Message sent to Dr. Debroah Loop for further advise.

## 2021-03-10 ENCOUNTER — Encounter: Payer: Self-pay | Admitting: *Deleted

## 2021-03-10 ENCOUNTER — Encounter: Payer: Self-pay | Admitting: Obstetrics & Gynecology

## 2021-03-10 ENCOUNTER — Telehealth: Payer: Self-pay | Admitting: *Deleted

## 2021-03-10 ENCOUNTER — Other Ambulatory Visit: Payer: Self-pay | Admitting: Obstetrics & Gynecology

## 2021-03-10 DIAGNOSIS — G43709 Chronic migraine without aura, not intractable, without status migrainosus: Secondary | ICD-10-CM

## 2021-03-10 MED ORDER — SUMATRIPTAN SUCCINATE 50 MG PO TABS: 50.0000 mg | ORAL_TABLET | ORAL | 0 refills | Status: DC | PRN

## 2021-03-10 NOTE — Telephone Encounter (Signed)
Received TC from patient reporting continued migraine headache X 4 days. Has tried Tylenol and caffeine and flexeril with no relief. Dr. Debroah Loop consulted and history of migraines with treatment with Imitrex noted. RX sent for Imitrex by Dr. Debroah Loop. Patient notified. Patient does not want to continue "just taking medication" and expresses desire for neurology consult. Staff message sent to Dr. Debroah Loop reporting patient's concern. Patient advised to fill RX and take as directed. Patient advised to seek care in the MAU for worsening symptoms or other emergent concerns.

## 2021-03-14 ENCOUNTER — Encounter: Payer: Self-pay | Admitting: *Deleted

## 2021-03-14 ENCOUNTER — Encounter: Payer: Self-pay | Admitting: Obstetrics & Gynecology

## 2021-03-17 ENCOUNTER — Ambulatory Visit: Payer: Medicaid Other | Admitting: Psychiatry

## 2021-03-17 ENCOUNTER — Encounter: Payer: Self-pay | Admitting: Obstetrics & Gynecology

## 2021-03-17 ENCOUNTER — Other Ambulatory Visit: Payer: Self-pay

## 2021-03-17 ENCOUNTER — Encounter: Payer: Self-pay | Admitting: Psychiatry

## 2021-03-17 VITALS — BP 139/91 | HR 89 | Ht 66.0 in | Wt 199.0 lb

## 2021-03-17 DIAGNOSIS — M542 Cervicalgia: Secondary | ICD-10-CM | POA: Diagnosis not present

## 2021-03-17 DIAGNOSIS — G43009 Migraine without aura, not intractable, without status migrainosus: Secondary | ICD-10-CM

## 2021-03-17 MED ORDER — CYPROHEPTADINE HCL 4 MG PO TABS: 4.0000 mg | ORAL_TABLET | Freq: Every day | ORAL | 3 refills | Status: DC

## 2021-03-17 NOTE — Patient Instructions (Addendum)
Start cyproheptadine 4 mg at bedtime for headache prevention Referral to physical therapy  MIGRAINE AND PREGNANCY  Migraine is a complex neurological disorder with many triggers, including sex hormones! This means that some women may experience changes in headache frequency when they are pregnant. In general, the increasing levels of estrogen during pregnancy can be protective, and women may see an improvement in migraine in the 2nd and 3rd trimester. After delivery, women typically return to the pre-pregnancy migraine pattern.   Treatment of migraine in pregnancy is tricky because the list of SAFE medications in pregnancy does not necessarily match the list of EFFECTIVE medications in headache.  The most difficult time is when a woman is planning pregnancy and it is unknown whether she is actually pregnant. Unfortunately, to avoid fetal exposure to potentially harmful medications, it is necessary to stop most migraine preventive medications and avoid the use of typical prescribed and over-the-counter as-needed medications previously used for headache.   ACUTE Treatment of Migraine in Pregnancy - Usual abortive treatments such as triptans and NSAIDS are generally considered unsafe in pregnancy.  - Acetaminophen (Tylenol) is less commonly used for migraine, however, it is considered the safest analgesic to use in pregnancy (category B), and it can be effective if used early in a migraine, especially in conjunction with caffeine and metoclopramide (reglan), an anti-nausea medication.   PROPHYLACTIC Treatment of Migraine in Pregnancy - Usual prophylactic migraine treatments are not safe to take in pregnancy - Magnesium oxide in doses up to 400mg  per day is often considered first line in pregnancy as a migraine preventative (level B evidence of efficacy) and is thought to be safe.  - Cyproheptadine (brand name Periactin) is an older anti-histamine that can be used (max dose 8mg /day) in prophylaxis and  has not been shown to have adverse effects to the fetus. Side effects include fatigue and weight gain. This should not be used while breastfeeding.   Non-pharmacological Methods of Treatment - these are the safest interventions and can significantly redcue the frequency and severity of migraine - Nerve blocks (lidocaine only unless after 20 weeks) - Physical Therapy - Acupuncture - Behavioral Therapies  - Relaxation strategies, stress management, biofeedback, yoga  - Identification of triggers: The major migraine triggers include stress, hormones, diet (missed meals more than specific food triggers), weather, sleep changes, odors, neck pain, lighting/glare, neck pain, exercise/exertion, caffeine intake and dehydration. - Lifestyle changes: eating regular healthy meals, adequate sleep, staying well-hydrated with water (64 oz/day), and at least 30 min of exercise (walking is fine) per day

## 2021-03-17 NOTE — Progress Notes (Signed)
Referring:  Woodroe Mode, MD [place],  [place] [id number]  PCP: Lurline Del, DO  Neurology was asked to evaluate Michele Gardner, a 36 year old female for a chief complaint of headaches.  Our recommendations of care will be communicated by shared medical record.    CC:  headaches  HPI:  Medical co-morbidities: HTN, currently pregnant  The patient presents for evaluation of daily migraines which have been present since she was 36 years old, but have worsened in severity since becoming pregnant. Migraines occur daily and last for several hours at a time. They are described as pounding pain in her vertex with associated photophobia, phonophobia, and nausea.  Takes Tylenol as needed but this doesn't help much. Flexeril helps but makes her drowsy.  Headache History: Onset: 15 years ago Triggers: none Aura: no Location: vertex Quality/Description: pounding Associated Symptoms:  Photophobia: yes  Phonophobia: yes  Nausea: yes Vomiting: yes Worse with activity?: yes Duration of headaches: several hours  Headache days per month: 30 Headache free days per month: 0  Current Treatment: Abortive Flexeril Tylenol  Preventative none  Prior Therapies                                 Labetalol 400 mg BID Flexeril Tylenol Excedrin  Headache Risk Factors: Headache risk factors and/or co-morbidities (+) Neck Pain  LABS: CBC    Component Value Date/Time   WBC 9.7 02/17/2021 1316   WBC 7.5 10/12/2020 1247   RBC 4.17 02/17/2021 1316   RBC 4.37 10/12/2020 1247   HGB 12.7 02/17/2021 1316   HCT 38.1 02/17/2021 1316   PLT 272 02/17/2021 1316   MCV 91 02/17/2021 1316   MCH 30.5 02/17/2021 1316   MCH 30.4 10/12/2020 1247   MCHC 33.3 02/17/2021 1316   MCHC 34.4 10/12/2020 1247   RDW 13.0 02/17/2021 1316   LYMPHSABS 1.0 02/17/2021 1316   MONOABS 0.7 06/11/2014 0029   EOSABS 0.2 02/17/2021 1316   BASOSABS 0.1 02/17/2021 1316   CMP Latest Ref Rng  & Units 02/17/2021 10/12/2020 11/30/2015  Glucose 70 - 99 mg/dL 76 108(H) 94  BUN 6 - 20 mg/dL _0 Creatinine 0.57 - 1.00 mg/dL 0.53(L) 0.79 0.50  Sodium 134 - 144 mmol/L 136 139 135  Potassium 3.5 - 5.2 mmol/L 4.4 4.0 3.8  Chloride 96 - 106 mmol/L 102 103 107  CO2 20 - 29 mmol/L 20 25 19(L)  Calcium 8.7 - 10.2 mg/dL 9.1 9.4 8.7(L)  Total Protein 6.0 - 8.5 g/dL 6.3 - 6.7  Total Bilirubin 0.0 - 1.2 mg/dL 0.3 - 0.6  Alkaline Phos 44 - 121 IU/L 60 - 139(H)  AST 0 - 40 IU/L 76(H) - 32  ALT 0 - 32 IU/L 72(H) - 17     IMAGING:  none  Current Outpatient Medications on File Prior to Visit  Medication Sig Dispense Refill   aspirin EC 81 MG tablet Take 1 tablet (81 mg total) by mouth daily. Take after 12 weeks for prevention of preeclampsia later in pregnancy 300 tablet 2   Blood Pressure Monitoring (BLOOD PRESSURE KIT) DEVI 1 kit by Does not apply route once a week. 1 each 0   cyclobenzaprine (FLEXERIL) 10 MG tablet Take 1 tablet (10 mg total) by mouth 3 (three) times daily as needed for muscle spasms. 30 tablet 2   labetalol (NORMODYNE) 200 MG tablet Take 2 tablets (  400 mg total) by mouth 2 (two) times daily. 120 tablet 3   Misc. Devices (GOJJI WEIGHT SCALE) MISC 1 Device by Does not apply route every 30 (thirty) days. 1 each 0   No current facility-administered medications on file prior to visit.     Allergies: Allergies  Allergen Reactions   Bee Venom Anaphylaxis    Family History: Migraine or other headaches in the family:  no Aneurysms in a first degree relative:  not first degree, uncle had an aneurysm Brain tumors in the family:  none Other neurological illness in the family: no  Past Medical History: Past Medical History:  Diagnosis Date   Asthma    Hypertension    Medical history non-contributory     Past Surgical History Past Surgical History:  Procedure Laterality Date   NO PAST SURGERIES     WISDOM TOOTH EXTRACTION  2007    Social History: Social  History   Tobacco Use   Smoking status: Former    Packs/day: 0.50    Types: Cigarettes    Quit date: 04/23/2015    Years since quitting: 5.9   Smokeless tobacco: Never  Vaping Use   Vaping Use: Never used  Substance Use Topics   Alcohol use: No   Drug use: No    ROS: Negative for fevers, chills. Positive for headaches. All other systems reviewed and negative unless stated otherwise in HPI.   Physical Exam:   Vital Signs: BP (!) 139/91    Pulse 89    Ht _0  (1.676 m)    Wt 199 lb (90.3 kg)    LMP 12/03/2020 (Approximate)    BMI 32.12 kg/m  GENERAL: well appearing,in no acute distress,alert SKIN:  Color, texture, turgor normal. No rashes or lesions HEAD:  Normocephalic/atraumatic. CV:  RRR RESP: Normal respiratory effort MSK: +tenderness to palpation over right occiput, neck, and shoulders  NEUROLOGICAL: Mental Status: Alert, oriented to person, place and time,Follows commands Cranial Nerves: PERRL, optic discs sharp OU, visual fields intact to confrontation,extraocular movements intact,facial sensation intact,no facial droop or ptosis,hearing grossly,no dysarthria Motor: muscle strength 5/5 both upper and lower extremities,no drift, normal tone Reflexes: 2+ throughout Sensation: intact to light touch all 4 extremities Coordination: Finger-to- nose-finger intact bilaterally Gait: normal-based   IMPRESSION: 36 year old pregnant female with a history of HTN who presents for evaluation of migraines. Her neurological exam today is normal. Discussed migraine treatment options in pregnancy including supplements, PT, nerve blocks, and preventive medications. Will start cyproheptadine for migraine prevention. Referral to neck PT placed for cervicalgia.  PLAN: -Prevention: Start cyproheptadine 4 mg QHS -Supplement information provided -Referral to neck PT -Counseled on limiting OTC medication use to avoid rebound headaches -next steps: consider PRN anti-emetic, occipital nerve  block   I spent a total of 25 minutes chart reviewing and counseling the patient. Headache education was done. Discussed treatment options including preventive and acute medications, natural supplements, and physical therapy. Discussed medication overuse headache and to limit use of acute treatments to no more than 2 days/week or 10 days/month. Discussed medication side effects, adverse reactions and drug interactions. Written educational materials and patient instructions outlining all of the above were given.  Follow-up: 3 months   Genia Harold, MD 03/17/2021   9:32 AM

## 2021-03-21 ENCOUNTER — Encounter: Payer: Self-pay | Admitting: Obstetrics & Gynecology

## 2021-03-21 DIAGNOSIS — O0990 Supervision of high risk pregnancy, unspecified, unspecified trimester: Secondary | ICD-10-CM

## 2021-03-24 ENCOUNTER — Encounter: Payer: Self-pay | Admitting: *Deleted

## 2021-03-24 ENCOUNTER — Telehealth: Payer: Self-pay | Admitting: Psychiatry

## 2021-03-24 ENCOUNTER — Other Ambulatory Visit: Payer: Self-pay | Admitting: Psychiatry

## 2021-03-24 DIAGNOSIS — R519 Headache, unspecified: Secondary | ICD-10-CM

## 2021-03-24 DIAGNOSIS — G08 Intracranial and intraspinal phlebitis and thrombophlebitis: Secondary | ICD-10-CM

## 2021-03-24 NOTE — Telephone Encounter (Signed)
Pt states she is having migraines daily and nothing is working.  Pt states she is approaching week 16 of pregnancy, please call pt.

## 2021-03-24 NOTE — Telephone Encounter (Signed)
The preventive medication can take 4-6 weeks to build in the system, so it is too early for it to start working. She can come in for an occipital nerve block to try to get some relief while waiting for the medication to kick in.  I will also order an MRI/MRV brain. There is an increased risk of blood clots during pregnancy so I want to make sure she doesn't have a clot in her veins triggering worsening of her headaches.

## 2021-03-25 ENCOUNTER — Inpatient Hospital Stay (HOSPITAL_COMMUNITY): Payer: Medicaid Other

## 2021-03-25 ENCOUNTER — Encounter (HOSPITAL_COMMUNITY): Payer: Self-pay | Admitting: Family Medicine

## 2021-03-25 ENCOUNTER — Telehealth: Payer: Self-pay | Admitting: Neurology

## 2021-03-25 ENCOUNTER — Other Ambulatory Visit: Payer: Self-pay

## 2021-03-25 ENCOUNTER — Inpatient Hospital Stay (HOSPITAL_COMMUNITY)
Admission: AD | Admit: 2021-03-25 | Discharge: 2021-03-25 | Disposition: A | Discharge status: Home / Self Care | Payer: Medicaid Other | Attending: Family Medicine | Admitting: Family Medicine

## 2021-03-25 DIAGNOSIS — Z3A16 16 weeks gestation of pregnancy: Secondary | ICD-10-CM | POA: Diagnosis not present

## 2021-03-25 DIAGNOSIS — G43109 Migraine with aura, not intractable, without status migrainosus: Secondary | ICD-10-CM | POA: Diagnosis not present

## 2021-03-25 DIAGNOSIS — G43909 Migraine, unspecified, not intractable, without status migrainosus: Secondary | ICD-10-CM | POA: Insufficient documentation

## 2021-03-25 DIAGNOSIS — O26892 Other specified pregnancy related conditions, second trimester: Secondary | ICD-10-CM | POA: Insufficient documentation

## 2021-03-25 DIAGNOSIS — O0990 Supervision of high risk pregnancy, unspecified, unspecified trimester: Secondary | ICD-10-CM | POA: Diagnosis not present

## 2021-03-25 DIAGNOSIS — H538 Other visual disturbances: Secondary | ICD-10-CM | POA: Insufficient documentation

## 2021-03-25 DIAGNOSIS — R519 Headache, unspecified: Secondary | ICD-10-CM | POA: Diagnosis not present

## 2021-03-25 DIAGNOSIS — Z3492 Encounter for supervision of normal pregnancy, unspecified, second trimester: Secondary | ICD-10-CM

## 2021-03-25 DIAGNOSIS — O2692 Pregnancy related conditions, unspecified, second trimester: Secondary | ICD-10-CM

## 2021-03-25 DIAGNOSIS — O99352 Diseases of the nervous system complicating pregnancy, second trimester: Secondary | ICD-10-CM | POA: Diagnosis present

## 2021-03-25 MED ORDER — PROMETHAZINE HCL 25 MG/ML IJ SOLN
25.0000 mg | Freq: Once | INTRAVENOUS | Status: AC
  Administered 2021-03-25: 25 mg via INTRAVENOUS
  Filled 2021-03-25: qty 1

## 2021-03-25 MED ORDER — SUMATRIPTAN SUCCINATE 25 MG PO TABS
25.0000 mg | ORAL_TABLET | Freq: Once | ORAL | Status: DC
Start: 2021-03-25 — End: 2021-03-25
  Filled 2021-03-25: qty 1

## 2021-03-25 MED ORDER — PROCHLORPERAZINE EDISYLATE 10 MG/2ML IJ SOLN
10.0000 mg | Freq: Four times a day (QID) | INTRAMUSCULAR | Status: DC | PRN
  Administered 2021-03-25: 10 mg via INTRAVENOUS
  Filled 2021-03-25 (×2): qty 2

## 2021-03-25 MED ORDER — ACETAMINOPHEN 500 MG PO TABS
1000.0000 mg | ORAL_TABLET | Freq: Once | ORAL | Status: AC
  Administered 2021-03-25: 1000 mg via ORAL
  Filled 2021-03-25: qty 2

## 2021-03-25 MED ORDER — LACTATED RINGERS IV BOLUS
250.0000 mL | Freq: Once | INTRAVENOUS | Status: AC
Start: 2021-03-25 — End: 2021-03-25
  Administered 2021-03-25: 250 mL via INTRAVENOUS

## 2021-03-25 MED ORDER — MAGNESIUM SULFATE 2 GM/50ML IV SOLN
2.0000 g | Freq: Once | INTRAVENOUS | Status: AC
  Administered 2021-03-25: 2 g via INTRAVENOUS
  Filled 2021-03-25: qty 50

## 2021-03-25 NOTE — MAU Note (Signed)
This is like the worst migraine.  Talked to neurologist, she thinks it might be a veinous clot.  "I know they want to do an MRI on the brain".  Was told if worse or persisted, to come here and to go ahead with the scan.  The meds she is taking are not working.

## 2021-03-25 NOTE — MAU Provider Note (Signed)
History     CSN: [id number]  Arrival date and time: 03/25/21 1521   Event Date/Time   First Provider Initiated Contact with Patient 03/25/21 1547      Chief Complaint  Patient presents with   Headache   HPI Michele Gardner is a 36 y.o. [id number] at 56w0dwho presents to MAU with chief complaint of migraine 2/2 history of migraines. Patient first experienced migraines around age 36 They have worsened in pregnancy. [facility] and has initiated Cyproheptadine 4 mg QHS. Patient reports she has tried other medications in the past without experiencing relief and so typically does not take anything else. Her migraines are consistently accompanied by blurry vision, present on arrival to MAU. She denies all other complaints or concerns including dizziness, weakness, syncope, slurred speech, facial droop. She is not experiencing difficulty performing ADLs. She denies pregnancy-related complaints including abdominal pain, vaginal bleeding, dysuria, fever or recent illness.  Patient [facility] care with [facility]  OB History     Gravida  4   Para  2   Term  2   Preterm  0   AB  1   Living  2      SAB  0   IAB  1   Ectopic  0   Multiple  0   Live Births  2           Past Medical History:  Diagnosis Date   Asthma    Hypertension    Medical history non-contributory     Past Surgical History:  Procedure Laterality Date   NO PAST SURGERIES     WISDOM TOOTH EXTRACTION  2007    Family History  Problem Relation Age of Onset   Hypertension Mother    Hyperlipidemia Mother    Melanoma Father    Alcohol abuse Father    Cancer Maternal Grandfather    Diabetes Paternal Grandmother    Cancer Paternal Grandmother    Diabetes Paternal Grandfather    Brain cancer Other     Social History   Tobacco Use   Smoking status: Former    Packs/day: 0.50    Types: Cigarettes    Quit date: 04/23/2015    Years since quitting:  5.9   Smokeless tobacco: Never  Vaping Use   Vaping Use: Never used  Substance Use Topics   Alcohol use: No   Drug use: No    Allergies:  Allergies  Allergen Reactions   Bee Venom Anaphylaxis    Medications Prior to Admission  Medication Sig Dispense Refill Last Dose   aspirin EC 81 MG tablet Take 1 tablet (81 mg total) by mouth daily. Take after 12 weeks for prevention of preeclampsia later in pregnancy 300 tablet 2 03/25/2021   cyproheptadine (PERIACTIN) 4 MG tablet Take 1 tablet (4 mg total) by mouth at bedtime. 30 tablet 3 03/24/2021   labetalol (NORMODYNE) 200 MG tablet Take 2 tablets (400 mg total) by mouth 2 (two) times daily. 120 tablet 3 03/25/2021   Blood Pressure Monitoring (BLOOD PRESSURE KIT) DEVI 1 kit by Does not apply route once a week. 1 each 0    cyclobenzaprine (FLEXERIL) 10 MG tablet Take 1 tablet (10 mg total) by mouth 3 (three) times daily as needed for muscle spasms. 30 tablet 2    Misc. Devices (GOJJI WEIGHT SCALE) MISC 1 Device by Does not apply route every 30 (thirty) days. 1 each 0     Review of  Systems  Eyes:  Negative for photophobia.  Neurological:  Positive for headaches. Negative for dizziness and facial asymmetry.  All other systems reviewed and are negative. Physical Exam   Blood pressure 111/74, pulse 87, temperature 98 F (36.7 C), temperature source Oral, resp. rate 16, height 5' 6"  (1.676 m), weight 93.1 kg, last menstrual period 12/03/2020, SpO2 99 %.  Physical Exam Vitals and nursing note reviewed.  Constitutional:      Appearance: She is well-developed.  Cardiovascular:     Rate and Rhythm: Normal rate and regular rhythm.     Heart sounds: Normal heart sounds.  Pulmonary:     Effort: Pulmonary effort is normal.     Breath sounds: Normal breath sounds.  Abdominal:     Palpations: Abdomen is soft.  Musculoskeletal:     Comments: Equal strength bilaterally in upper and lower extremities  Skin:    General: Skin is warm and dry.      Capillary Refill: Capillary refill takes less than 2 seconds.  Neurological:     Mental Status: She is alert and oriented to person, place, and time.     Cranial Nerves: Cranial nerves 2-12 are intact. No cranial nerve deficit or facial asymmetry.     Sensory: Sensation is intact. No sensory deficit.     Motor: Motor function is intact. No weakness.     Coordination: Coordination is intact. Romberg sign negative. Coordination normal.     Gait: Gait is intact. Gait normal.  Psychiatric:        Mood and Affect: Mood normal.        Speech: Speech normal.        Behavior: Behavior normal.    MAU Course/MDM  Procedures  --Migraine pain in setting of about 15 year history of migraines. Intact Neuro exam in MAU.  --First appointment with [facility] was 03/17/2021. Patient started on nightly Cyproheptadine and referred to PT during that appointment --No improvement in pain score with medications given in MAU. Dr. Leonel Ramsay, on call for Neurology service consulted at [place]. Advised Compazine, inpatient team unable to provide injections discussed during patient's previous outpatient evaluation. Order placed for Compazine.  --Prior to discharge patient reports improved pain score. Previously 10/10. Now 6/10.  Patient Vitals for the past 24 hrs:  BP Temp Temp src Pulse Resp SpO2 Height Weight  03/25/21 1621 111/74 -- -- 87 16 99 % -- --  03/25/21 1538 132/85 98 F (36.7 C) Oral 92 18 98 % 5' 6"  (1.676 m) 93.1 kg   Meds ordered this encounter  Medications   promethazine (PHENERGAN) 25 mg in lactated ringers 1,000 mL infusion   magnesium sulfate IVPB 2 g 50 mL   acetaminophen (TYLENOL) tablet 1,000 mg   prochlorperazine (COMPAZINE) injection 10 mg   lactated ringers bolus 250 mL  MR BRAIN WO CONTRAST  Result Date: 03/25/2021 CLINICAL DATA:  Worsening headache.  Pregnancy. EXAM: MRI HEAD WITHOUT CONTRAST MRV HEAD WITHOUT CONTRAST TECHNIQUE: Multiplanar, multi-echo pulse sequences of  the brain and surrounding structures were acquired without intravenous contrast. Angiographic images of the intracranial venous structures were acquired using MRV technique without intravenous contrast. COMPARISON:  No pertinent prior exam. FINDINGS: MRI HEAD WITHOUT CONTRAST Brain: There is no evidence of an acute infarct, intracranial hemorrhage, mass, midline shift, or extra-axial fluid collection. The ventricles and sulci are normal. The cerebellar tonsils are normally positioned. The pituitary gland is normal in size. The brain is normal in signal. Vascular: Major intracranial vascular flow voids  are preserved. Skull and upper cervical spine: Unremarkable bone marrow signal. Sinuses/Orbits: Unremarkable orbits. Paranasal sinuses and mastoid air cells are clear. Other: None. MR VENOGRAM WITHOUT CONTRAST The superior sagittal sinus, internal cerebral veins, vein of Galen, straight sinus, transverse sinuses, sigmoid sinuses, and jugular bulbs are patent without evidence of thrombus or significant stenosis. IMPRESSION: Negative head MRI and MRV. Electronically Signed   By: Logan Bores M.D.   On: 03/25/2021 18:36   MR MRV HEAD WO CM  Result Date: 03/25/2021 CLINICAL DATA:  Worsening headache.  Pregnancy. EXAM: MRI HEAD WITHOUT CONTRAST MRV HEAD WITHOUT CONTRAST TECHNIQUE: Multiplanar, multi-echo pulse sequences of the brain and surrounding structures were acquired without intravenous contrast. Angiographic images of the intracranial venous structures were acquired using MRV technique without intravenous contrast. COMPARISON:  No pertinent prior exam. FINDINGS: MRI HEAD WITHOUT CONTRAST Brain: There is no evidence of an acute infarct, intracranial hemorrhage, mass, midline shift, or extra-axial fluid collection. The ventricles and sulci are normal. The cerebellar tonsils are normally positioned. The pituitary gland is normal in size. The brain is normal in signal. Vascular: Major intracranial vascular flow voids  are preserved. Skull and upper cervical spine: Unremarkable bone marrow signal. Sinuses/Orbits: Unremarkable orbits. Paranasal sinuses and mastoid air cells are clear. Other: None. MR VENOGRAM WITHOUT CONTRAST The superior sagittal sinus, internal cerebral veins, vein of Galen, straight sinus, transverse sinuses, sigmoid sinuses, and jugular bulbs are patent without evidence of thrombus or significant stenosis. IMPRESSION: Negative head MRI and MRV. Electronically Signed   By: Logan Bores M.D.   On: 03/25/2021 18:36     Assessment and Plan  --35 y.o. [id number] at [redacted]w[redacted]d --[place] by Doppler --S/p normal MRI and MRV --Intact Neuro exam --S/p Consult with Neurology, no additional intervention indicated at this time --Pain score improved to 6/10 at time of discharge --Discharge home in stable condition  F[facility]U: [facility]  SDarlina Rumpf CWoodman2/11/2021, 8:31 PM

## 2021-03-25 NOTE — Telephone Encounter (Signed)
Patient called with a very severe headache.  Given your recommendations for an MRI of the brain and an MRV and the severity I did recommend that she go to the emergency room.  When I spoke with her she was in the maternal-fetal ward awaiting care.  She started the cyproheptadine.  She has not heard about scheduling the MRI of the brain and the MRV so I did advise her to discuss that with the physician that she sees, tell them to read your note and hopefully they can get the MRI of the brain and the MRV completed while she is in the hospital.  I let her know you we will review her hospital notes on Monday and may give her a call if needed, she is on cyproheptadine at bedtime but she has no acute management and this may be something that you are able to provide her with if the MRI of the brain and the MRV are normal.  Her speech was normal, she was not in any significant distress that I could tell, she was a good historian, not confused, alert and oriented, and awaiting care.

## 2021-03-28 ENCOUNTER — Telehealth: Payer: Self-pay | Admitting: Psychiatry

## 2021-03-28 NOTE — Telephone Encounter (Signed)
Noted, thank you

## 2021-03-28 NOTE — Telephone Encounter (Signed)
As you know patient went and had MRI/MRV on 03/25/21. Do you still want her to have the images that you have order for her from 03/24/21?

## 2021-03-28 NOTE — Telephone Encounter (Signed)
She doesn't need to get the MRI/MRV I ordered since she had it done in the hospital, thanks

## 2021-03-31 ENCOUNTER — Ambulatory Visit (INDEPENDENT_AMBULATORY_CARE_PROVIDER_SITE_OTHER): Payer: Medicaid Other | Admitting: Psychiatry

## 2021-03-31 VITALS — BP 137/87 | HR 104

## 2021-03-31 DIAGNOSIS — G43701 Chronic migraine without aura, not intractable, with status migrainosus: Secondary | ICD-10-CM | POA: Diagnosis not present

## 2021-03-31 NOTE — Patient Instructions (Addendum)
Start magnesium 400 mg at bedtime for headache prevention Continue cyproheptadine at bedtime Physical therapy for the neck  GENERAL HEADACHE INSTRUCTIONS Headache Preventive Treatment: Please keep in mind that it takes 4-6 weeks for the medication to start working well and 2-3 months at the appropriate dose before deciding if it will be useful or not. If it is not helping at all by this time, then we will discuss other medications to try. Supplements may take 3-6 months until you see full effect.   Natural supplements: Magnesium Oxide or Magnesium Glycinate 500 mg at bed (up to 800 mg daily) Coenzyme Q10 300 mg in AM Vitamin B2- 200 mg twice a day  Add 1 supplement at a time since even natural supplements can have undesirable side effects. You can sometimes buy supplements cheaper (especially Coenzyme Q10) at www.https://compton-perez.com/ or at [employer].  Vitamins and herbs that show potential:  Magnesium: Magnesium (250 mg twice a day or 500 mg at bed) has a relaxant effect on smooth muscles such as blood vessels. Individuals suffering from frequent or daily headache usually have low magnesium levels which can be increase with daily supplementation of 400-750 mg. Three trials found 40-90% average headache reduction  when used as a preventative. Magnesium also demonstrated the benefit in menstrually related migraine.  Magnesium is part of the messenger system in the serotonin cascade and it is a good muscle relaxant.  It is also useful for constipation which can be a side effect of other medications used to treat migraine. Good sources include nuts, whole grains, and tomatoes. Side Effects: loose stool/diarrhea Riboflavin (vitamin B 2) 200 mg twice a day. This vitamin assists nerve cells in the production of ATP a principal energy storing molecule.  It is necessary for many chemical reactions in the body.  There have been at least 3 clinical trials of riboflavin using 400 mg per day all of which suggested that  migraine frequency can be decreased.  All 3 trials showed significant improvement in over half of migraine sufferers.  The supplement is found in bread, cereal, milk, meat, and poultry.  Most Americans get more riboflavin than the recommended daily allowance, however riboflavin deficiency is not necessary for the supplements to help prevent headache. Side effects: energizing, green urine  Coenzyme Q10: This is present in almost all cells in the body and is critical component for the conversion of energy.  Recent studies have shown that a nutritional supplement of CoQ10 can reduce the frequency of migraine attacks by improving the energy production of cells as with riboflavin.  Doses of 150 mg twice a day have been shown to be effective.  Melatonin: Increasing evidence shows correlation between melatonin secretion and headache conditions.  Melatonin supplementation has decreased headache intensity and duration.  It is widely used as a sleep aid.  Sleep is natures way of dealing with migraine.  A dose of 3 mg is recommended to start for headaches including cluster headache. Higher doses up to 15 mg has been reviewed for use in Cluster headache and have been used. The rationale behind using melatonin for cluster is that many theories regarding the cause of Cluster headache center around the disruption of the normal circadian rhythm in the brain.  This helps restore the normal circadian rhythm.  Ginger: Ginger has a small amount of antihistamine and anti-inflammatory action which may help headache.  It is primarily used for nausea and may aid in the absorption of other medications. HEADACHE DIET: Foods and beverages which may  trigger migraine Note that only 20% of headache patients are food sensitive. You will know if you are food sensitive if you get a headache consistently 20 minutes to 2 hours after eating a certain food. Only cut out a food if it causes headaches, otherwise you might remove foods you enjoy!  What matters most for diet is to eat a well balanced healthy diet full of vegetables and low fat protein, and to not miss meals.  Chocolate, other sweets ALL cheeses except cottage and cream cheese Dairy products, yogurt, sour cream, ice cream Liver Meat extracts (Bovril, Marmite, meat tenderizers) Meats or fish which have undergone aging, fermenting, pickling or smoking. These include: Hotdogs,salami,Lox,sausage, mortadellas,smoked salmon, pepperoni, Pickled herring Pods of broad bean (English beans, Chinese pea pods, [place] (fava) beans, lima and navy beans Ripe avocado, ripe banana Yeast extracts or active yeast preparations such as Brewer's or Fleishman's (commercial bakes goods are permitted) Tomato based foods, pizza (lasagna, etc.)  MSG (monosodium glutamate) is disguised as many things; look for these common aliases: Monopotassium glutamate Autolysed yeast Hydrolysed protein Sodium caseinate flavorings all natural preservatives" Nutrasweet  Avoid all other foods that convincingly provoke headaches.  Resources: The Dizzy Lu Duffel Your Headache Diet, migrainestrong.com  https://www.aguirre.org/

## 2021-03-31 NOTE — Progress Notes (Signed)
Procedure: Occipital Nerve injection   Location: bilateral occiput  The risks, benefits and anticipated outcomes of the procedure, the risks and benefits of the alternatives to the procedure, and the roles and tasks of the personnel to be involved, were discussed with the patient, and the patient consents to the procedure and agrees to proceed.     10 cc bupivacaine was drawn up in 2 syringes (5 cc).  The right and left greater occipital nerves were injected 3cm caudal and 1.5 cm lateral to the inion where the main trunk of the occipital nerve penetrates the semispinalis muscle.  The needle was placed perpendicular and the needle advanced 1.5 cm. After aspiration to ensure no obstruction or presence of blood, the area was injected.  The needle was repositioned in a fan-like manner and the entire area was injected. Pressure was held and no hematoma was noted.    Ocie Doyne, MD 03/31/21 1:52 PM

## 2021-04-05 ENCOUNTER — Other Ambulatory Visit: Payer: Self-pay

## 2021-04-05 ENCOUNTER — Ambulatory Visit (INDEPENDENT_AMBULATORY_CARE_PROVIDER_SITE_OTHER): Payer: Medicaid Other | Admitting: Obstetrics and Gynecology

## 2021-04-05 ENCOUNTER — Encounter: Payer: Self-pay | Admitting: Obstetrics and Gynecology

## 2021-04-05 VITALS — BP 132/87 | HR 101 | Wt 207.0 lb

## 2021-04-05 DIAGNOSIS — O0990 Supervision of high risk pregnancy, unspecified, unspecified trimester: Secondary | ICD-10-CM

## 2021-04-05 DIAGNOSIS — O10919 Unspecified pre-existing hypertension complicating pregnancy, unspecified trimester: Secondary | ICD-10-CM

## 2021-04-05 NOTE — Therapy (Incomplete)
OUTPATIENT PHYSICAL THERAPY CERVICAL EVALUATION   Patient Name: Michele Gardner MRN: [id number] DOB:Dec 06, 1985, 36 y.o., female Today's Date: 04/05/2021    Past Medical History:  Diagnosis Date   Asthma    Hypertension    Medical history non-contributory    Past Surgical History:  Procedure Laterality Date   NO PAST SURGERIES     WISDOM TOOTH EXTRACTION  2007   Patient Active Problem List   Diagnosis Date Noted   Abnormal TSH 03/08/2021   Supervision of high risk pregnancy, antepartum 01/24/2021   Chronic hypertension affecting pregnancy 12/29/2020   Myalgia 10/14/2020   Gastroesophageal reflux disease 10/23/2019   Benign essential hypertension 08/25/2016   Migraine with aura and without status migrainosus, not intractable 08/25/2016    PCP: Jackelyn Poling, DO  REFERRING PROVIDER: Ocie Doyne, MD  REFERRING DIAG: Cervicalgia  THERAPY DIAG:  No diagnosis found.  ONSET DATE: ***  SUBJECTIVE:                                                                                                                                                                                                         SUBJECTIVE STATEMENT: ***  PERTINENT HISTORY:  ***  PAIN:  Are you having pain? {yes/no:20286} NPRS scale: ***/10 Pain location: *** Pain orientation: {Pain Orientation:25161}  PAIN TYPE: {type:313116} Pain description: {PAIN DESCRIPTION:21022940}  Aggravating factors: *** Relieving factors: ***  PRECAUTIONS: {Therapy precautions:24002}  WEIGHT BEARING RESTRICTIONS {Yes ***/No:24003}  FALLS:  Has patient fallen in last 6 months? {yes/no:20286} Number of falls: ***  LIVING ENVIRONMENT: Lives with: {[facility] lives with:25569::"lives with their family"} Lives in: {Lives in:25570} Stairs: {yes/no:20286}; {Stairs:24000} Has following equipment at home: {Assistive devices:23999}  OCCUPATION: ***  PLOF: {PLOF:24004}  PATIENT GOALS ***  OBJECTIVE:   DIAGNOSTIC  FINDINGS:  IMPRESSION: Negative head MRI and MRV.  PATIENT SURVEYS:  {rehab surveys:24030}   COGNITION: Overall cognitive status: {cognition:24006}   SENSATION: Light touch: {intact/deficits:24005} Hot/Cold: {intact/deficits:24005} Proprioception: {intact/deficits:24005}  POSTURE:  ***  PALPATION: ***   CERVICAL AROM/PROM  A/PROM A/PROM (deg) 04/05/2021  Flexion   Extension   Right lateral flexion   Left lateral flexion   Right rotation   Left rotation    (Blank rows = not tested)  UE AROM/PROM:  A/PROM Right 04/05/2021 Left 04/05/2021  Shoulder flexion    Shoulder extension    Shoulder abduction    Shoulder adduction    Shoulder extension    Shoulder internal rotation    Shoulder external rotation    Elbow flexion    Elbow extension    Wrist  flexion    Wrist extension    Wrist ulnar deviation    Wrist radial deviation    Wrist pronation    Wrist supination     (Blank rows = not tested)  UE MMT:  MMT Right 04/05/2021 Left 04/05/2021  Shoulder flexion    Shoulder extension    Shoulder abduction    Shoulder adduction    Shoulder extension    Shoulder internal rotation    Shoulder external rotation    Middle trapezius    Lower trapezius    Elbow flexion    Elbow extension    Wrist flexion    Wrist extension    Wrist ulnar deviation    Wrist radial deviation    Wrist pronation    Wrist supination    Grip strength     (Blank rows = not tested)  CERVICAL SPECIAL TESTS:  {Cervical special tests:25246}   FUNCTIONAL TESTS:  {Functional tests:24029}    TODAY'S TREATMENT:  ***   PATIENT EDUCATION:  Education details: *** Person educated: {Person educated:25204} Education method: {Education Method:25205} Education comprehension: {Education Comprehension:25206}   HOME EXERCISE PROGRAM: ***  ASSESSMENT:  CLINICAL IMPRESSION: Patient is a *** y.o. *** who was seen today for physical therapy evaluation and treatment for ***.     OBJECTIVE IMPAIRMENTS {opptimpairments:25111}.   ACTIVITY LIMITATIONS {activity limitations:25113}.   PERSONAL FACTORS {Personal factors:25162} are also affecting patient's functional outcome.    REHAB POTENTIAL: {rehabpotential:25112}  CLINICAL DECISION MAKING: {clinical decision making:25114}  EVALUATION COMPLEXITY: {Evaluation complexity:25115}   GOALS: Goals reviewed with patient? {yes/no:20286}  SHORT TERM GOALS:  STG Name Target Date Goal status  1 *** Baseline:  {follow up:25551} {GOALSTATUS:25110}  2 *** Baseline:  {follow up:25551} {GOALSTATUS:25110}  3 *** Baseline: {follow up:25551} {GOALSTATUS:25110}  4 *** Baseline: {follow up:25551} {GOALSTATUS:25110}  5 *** Baseline: {follow up:25551} {GOALSTATUS:25110}  6 *** Baseline: {follow up:25551} {GOALSTATUS:25110}  7 *** Baseline: {follow up:25551} {GOALSTATUS:25110}   LONG TERM GOALS:   LTG Name Target Date Goal status  1 *** Baseline: {follow up:25551} {GOALSTATUS:25110}  2 *** Baseline: {follow up:25551} {GOALSTATUS:25110}  3 *** Baseline: {follow up:25551} {GOALSTATUS:25110}  4 *** Baseline: {follow up:25551} {GOALSTATUS:25110}  5 *** Baseline: {follow up:25551} {GOALSTATUS:25110}  6 *** Baseline: {follow up:25551} {GOALSTATUS:25110}  7 *** Baseline: {follow up:25551} {GOALSTATUS:25110}   PLAN: PT FREQUENCY: {rehab frequency:25116}  PT DURATION: {rehab duration:25117}  PLANNED INTERVENTIONS: {rehab planned interventions:25118::"Therapeutic exercises","Therapeutic activity","Neuro Muscular re-education","Balance training","Gait training","Patient/Family education","Joint mobilization"}  PLAN FOR NEXT SESSION: Joellyn Rued, PT 04/05/2021, 8:51 PM

## 2021-04-05 NOTE — Progress Notes (Signed)
Patient presents for ROB. Patient has no concerns today. 

## 2021-04-05 NOTE — Progress Notes (Signed)
° °  PRENATAL VISIT NOTE  Subjective:  Michele Gardner is a 36 y.o. [phone] at [redacted]w[redacted]d being seen today for ongoing prenatal care.  She is currently monitored for the following issues for this high-risk pregnancy and has Benign essential hypertension; Migraine with aura and without status migrainosus, not intractable; Gastroesophageal reflux disease; Myalgia; Chronic hypertension affecting pregnancy; Supervision of high risk pregnancy, antepartum; and Abnormal TSH on their problem list.  Patient reports persistent migraine headache for which she saw a neurologist.  Contractions: Not present. Vag. Bleeding: None.  Movement: Present. Denies leaking of fluid.   The following portions of the patient's history were reviewed and updated as appropriate: allergies, current medications, past family history, past medical history, past social history, past surgical history and problem list.   Objective:   Vitals:   04/05/21 1037  BP: 132/87  Pulse: (!) 101  Weight: 207 lb (93.9 kg)    Fetal Status: Fetal Heart Rate (bpm): 150   Movement: Present     General:  Alert, oriented and cooperative. Patient is in no acute distress.  Skin: Skin is warm and dry. No rash noted.   Cardiovascular: Normal heart rate noted  Respiratory: Normal respiratory effort, no problems with respiration noted  Abdomen: Soft, gravid, appropriate for gestational age.  Pain/Pressure: Absent     Pelvic: Cervical exam deferred        Extremities: Normal range of motion.  Edema: None  Mental Status: Normal mood and affect. Normal behavior. Normal judgment and thought content.   Assessment and Plan:  Pregnancy: [id number] at [redacted]w[redacted]d 1. Supervision of high risk pregnancy, antepartum Patient is doing well without complaints AFP today Anatomy ultrasound ordered  2. Chronic hypertension affecting pregnancy Stable Continue labetalol and ASA  Preterm labor symptoms and general obstetric precautions including but not limited to vaginal  bleeding, contractions, leaking of fluid and fetal movement were reviewed in detail with the patient. Please refer to After Visit Summary for other counseling recommendations.   Return in about 4 weeks (around 05/03/2021) for in person, ROB, High risk.  Future Appointments  Date Time Provider [place]  04/06/2021 10:15 AM Angeline S[facility]  04/15/2021  8:30 AM [facility] NURSE [facility]  04/15/2021  8:45 AM [facility] US5 [facility]  04/29/2021  9:30 AM Teague Bobbye Morton, PA-C [facility]  06/22/2021  3:30 PM Genia Harold, MD [facility] None    Mora Bellman, MD

## 2021-04-06 ENCOUNTER — Ambulatory Visit: Payer: Medicaid Other | Attending: Psychiatry

## 2021-04-08 LAB — AFP, SERUM, OPEN SPINA BIFIDA
AFP MoM: 2.07
AFP Value: 68 ng/mL
Gest. Age on Collection Date: 17 weeks
Maternal Age At EDD: 33.6 yr
OSBR Risk 1 IN: 690
Test Results:: NEGATIVE
Weight: 207 [lb_av]

## 2021-04-15 ENCOUNTER — Ambulatory Visit (HOSPITAL_BASED_OUTPATIENT_CLINIC_OR_DEPARTMENT_OTHER): Payer: Medicaid Other | Admitting: Obstetrics

## 2021-04-15 ENCOUNTER — Other Ambulatory Visit: Payer: Self-pay

## 2021-04-15 ENCOUNTER — Encounter: Payer: Self-pay | Admitting: *Deleted

## 2021-04-15 ENCOUNTER — Ambulatory Visit: Payer: Medicaid Other | Admitting: *Deleted

## 2021-04-15 ENCOUNTER — Ambulatory Visit: Payer: Medicaid Other | Attending: Obstetrics and Gynecology

## 2021-04-15 ENCOUNTER — Other Ambulatory Visit: Payer: Self-pay | Admitting: *Deleted

## 2021-04-15 ENCOUNTER — Encounter: Payer: Self-pay | Admitting: Family Medicine

## 2021-04-15 ENCOUNTER — Ambulatory Visit: Payer: Medicaid Other | Attending: Family Medicine

## 2021-04-15 VITALS — BP 139/95 | HR 90

## 2021-04-15 DIAGNOSIS — O10912 Unspecified pre-existing hypertension complicating pregnancy, second trimester: Secondary | ICD-10-CM | POA: Diagnosis not present

## 2021-04-15 DIAGNOSIS — O283 Abnormal ultrasonic finding on antenatal screening of mother: Secondary | ICD-10-CM

## 2021-04-15 DIAGNOSIS — G43109 Migraine with aura, not intractable, without status migrainosus: Secondary | ICD-10-CM

## 2021-04-15 DIAGNOSIS — O359XX0 Maternal care for (suspected) fetal abnormality and damage, unspecified, not applicable or unspecified: Secondary | ICD-10-CM | POA: Insufficient documentation

## 2021-04-15 DIAGNOSIS — Z3A19 19 weeks gestation of pregnancy: Secondary | ICD-10-CM | POA: Diagnosis not present

## 2021-04-15 DIAGNOSIS — O0990 Supervision of high risk pregnancy, unspecified, unspecified trimester: Secondary | ICD-10-CM | POA: Diagnosis not present

## 2021-04-15 NOTE — Progress Notes (Unsigned)
cmv

## 2021-04-15 NOTE — Progress Notes (Signed)
MFM Note ? ?Michele Gardner was seen for a detailed fetal anatomy scan due to chronic hypertension treated with labetalol 200 mg twice a day. ? ?She denies any other significant past medical history and denies any problems in her current pregnancy.   ? ?She had a cell free DNA test earlier in her pregnancy which indicated a low risk for trisomy 53, 42, and 13. A female fetus is predicted.  ? ?She was informed that the fetal growth and amniotic fluid level were appropriate for her gestational age.  ? ?On today's exam, echogenic bowel was noted. ? ?The following were discussed during today's consultation: ? ?Echogenic bowel ? ?The causes of echogenic bowel including a normal variant, fetal aneuploidy, swallowed  blood, cystic fibrosis, and viral infections were discussed.  The association of bowel malrotation/atresia associated with echogenic bowel was also discussed. She denies any recent vaginal bleeding. She has screened negative for cystic fibrosis. ? ?Due to the echogenic bowel noted today, the patient was offered and declined an amniocentesis for definitive diagnosis of fetal aneuploidy.  She is comfortable with her negative cell free DNA test.  ? ?The limitations of ultrasound in the detection of all anomalies was discussed today.  ? ?Due to the echogenic bowel noted today, she had blood drawn today to be screened for CMV and toxoplasmosis. ? ?Due to the potential association of fetal growth restriction with echogenic bowel, we will continue to follow her with growth ultrasounds throughout her pregnancy.  We will also assess for signs of bowel dilatation which may be a sign of gut malrotation or atresia later in her pregnancy. ? ?Chronic hypertension in pregnancy ? ?The implications and management of chronic hypertension in pregnancy was discussed.  ? ?The patient was advised that should her blood pressures be elevated later in her pregnancy, the dosage of her antihypertensive medications may need to be increased.    ? ?The increased risk of superimposed preeclampsia, an indicated preterm delivery, and possible fetal growth restriction due to chronic hypertension in pregnancy was discussed.  ? ?Weekly fetal testing should be started at around 32 weeks.  ? ?To decrease her risk of superimposed preeclampsia, she should continue taking a daily baby aspirin (81 mg daily) for preeclampsia prophylaxis.  ? ?A follow up exam was scheduled in 4 weeks.  ? ?The patient stated that all of her questions had been answered. ? ?A total of 30 minutes was spent counseling and coordinating the care for this patient.  Greater than 50% of the time was spent in direct face-to-face contact. ?

## 2021-04-16 LAB — TOXOPLASMA GONDII ANTIBODY, IGG: Toxoplasma IgG Ratio: <3 IU/mL (ref 0.0–7.1)

## 2021-04-16 LAB — TOXOPLASMA GONDII ANTIBODY, IGM: Toxoplasma Antibody- IgM: <3 AU/mL (ref 0.0–7.9)

## 2021-04-16 LAB — CMV IGM: CMV IgM Ser EIA-aCnc: <30 AU/mL (ref 0.0–29.9)

## 2021-04-16 LAB — CMV ANTIBODY, IGG (EIA): CMV Ab - IgG: 5.5 U/mL — ABNORMAL HIGH (ref 0.00–0.59)

## 2021-04-16 LAB — INFECT DISEASE AB IGM REFLEX 1

## 2021-04-18 ENCOUNTER — Telehealth: Payer: Self-pay | Admitting: *Deleted

## 2021-04-20 ENCOUNTER — Telehealth: Payer: Self-pay

## 2021-04-20 ENCOUNTER — Other Ambulatory Visit: Payer: Self-pay

## 2021-04-20 ENCOUNTER — Encounter (HOSPITAL_COMMUNITY): Payer: Self-pay | Admitting: Obstetrics and Gynecology

## 2021-04-20 ENCOUNTER — Inpatient Hospital Stay (HOSPITAL_COMMUNITY)
Admission: AD | Admit: 2021-04-20 | Discharge: 2021-04-20 | Disposition: A | Discharge status: Home / Self Care | Payer: Medicaid Other | Attending: Obstetrics and Gynecology | Admitting: Obstetrics and Gynecology

## 2021-04-20 DIAGNOSIS — O99612 Diseases of the digestive system complicating pregnancy, second trimester: Secondary | ICD-10-CM | POA: Insufficient documentation

## 2021-04-20 DIAGNOSIS — O210 Mild hyperemesis gravidarum: Secondary | ICD-10-CM | POA: Diagnosis present

## 2021-04-20 DIAGNOSIS — A084 Viral intestinal infection, unspecified: Secondary | ICD-10-CM | POA: Diagnosis not present

## 2021-04-20 DIAGNOSIS — G43109 Migraine with aura, not intractable, without status migrainosus: Secondary | ICD-10-CM

## 2021-04-20 DIAGNOSIS — O0990 Supervision of high risk pregnancy, unspecified, unspecified trimester: Secondary | ICD-10-CM

## 2021-04-20 DIAGNOSIS — O10012 Pre-existing essential hypertension complicating pregnancy, second trimester: Secondary | ICD-10-CM | POA: Insufficient documentation

## 2021-04-20 DIAGNOSIS — Z3A19 19 weeks gestation of pregnancy: Secondary | ICD-10-CM | POA: Insufficient documentation

## 2021-04-20 DIAGNOSIS — O219 Vomiting of pregnancy, unspecified: Secondary | ICD-10-CM | POA: Diagnosis not present

## 2021-04-20 DIAGNOSIS — Z87891 Personal history of nicotine dependence: Secondary | ICD-10-CM | POA: Diagnosis not present

## 2021-04-20 LAB — URINALYSIS, ROUTINE W REFLEX MICROSCOPIC
Bilirubin Urine: NEGATIVE
Glucose, UA: NEGATIVE mg/dL
Hgb urine dipstick: NEGATIVE
Ketones, ur: 5 mg/dL — AB
Leukocytes,Ua: NEGATIVE
Nitrite: NEGATIVE
Protein, ur: NEGATIVE mg/dL
Specific Gravity, Urine: 1.025 (ref 1.005–1.030)
pH: 7 (ref 5.0–8.0)

## 2021-04-20 LAB — COMPREHENSIVE METABOLIC PANEL
ALT: 43 U/L (ref 0–44)
AST: 30 U/L (ref 15–41)
Albumin: 2.8 g/dL — ABNORMAL LOW (ref 3.5–5.0)
Alkaline Phosphatase: 56 U/L (ref 38–126)
Anion gap: 9 (ref 5–15)
BUN: 7 mg/dL (ref 6–20)
CO2: 22 mmol/L (ref 22–32)
Calcium: 8.2 mg/dL — ABNORMAL LOW (ref 8.9–10.3)
Chloride: 104 mmol/L (ref 98–111)
Creatinine, Ser: 0.65 mg/dL (ref 0.44–1.00)
GFR, Estimated: >60 mL/min (ref >60)
Glucose, Bld: 99 mg/dL (ref 70–99)
Potassium: 3.9 mmol/L (ref 3.5–5.1)
Sodium: 135 mmol/L (ref 135–145)
Total Bilirubin: 0.5 mg/dL (ref 0.3–1.2)
Total Protein: 6.4 g/dL — ABNORMAL LOW (ref 6.5–8.1)

## 2021-04-20 MED ORDER — PROMETHAZINE HCL 25 MG PO TABS: 25.0000 mg | ORAL_TABLET | Freq: Four times a day (QID) | ORAL | 0 refills | Status: DC | PRN

## 2021-04-20 MED ORDER — PROMETHAZINE HCL 25 MG PO TABS
25.0000 mg | ORAL_TABLET | Freq: Once | ORAL | Status: AC
  Administered 2021-04-20: 25 mg via ORAL
  Filled 2021-04-20: qty 1

## 2021-04-20 NOTE — Discharge Instructions (Signed)
Please make sure you try to stay hydrated with small sips of fluids.  Generally room temperature is better tolerated on the stomach.  Water, diluted apple juice, water down Gatorade, or your drink of choice should be fine.  If you are able to tolerate liquids, you can also try gentle foods such as crackers, toast, rice, bananas and see how you do with this. ? ?I have sent in Phenergan into the pharmacy to help with nausea.  Hopefully this will continue to improve over the next 1-2 days as it has for your family.  Please return to the MAU if you are unable to keep any food down and worried that you are significantly dehydrated, large leakage of fluid, vaginal bleeding etc.  ?

## 2021-04-20 NOTE — MAU Provider Note (Signed)
MAU Provider Note  ? ?History  ?  ? ?CSN: [id number] ? ?Arrival date and time: 04/20/21 1010 ? ? None  ?  ? ?Chief Complaint  ?Patient presents with  ? Nausea  ? ?Michele Gardner is 36 yo female [phone] presenting for evaluation of N/V and inability to keep anything down.  ? ?She reports this started yesterday evening around 11:30 PM.  She has since vomited approximately 3 times, initially appeared like the food she just ate and now starting to clear.  Will vomit after trying to eat or drink.  Last BM was yesterday evening and normal.  Since starting she also has felt intermittent chills and general myalgias.  She denies any objective fever, severe abdominal pain, vaginal bleeding, or leakage of fluid.  She has not tried any antinausea medicine at home, reports Zofran makes her have migraines. ? ?Of note, her daughter started vomiting with similar symptoms on Sunday and then her husband had the same symptoms on Monday/Tuesday.  They both are starting to feel much better. ? ?This pregnancy has been complicated by chronic hypertension on labetalol, migraines, and fetal echogenic bowel. Next ob appointment is on 3/21.  ? ? ?Past Medical History:  ?Diagnosis Date  ? Asthma   ? Hypertension   ? Medical history non-contributory   ? ? ?Past Surgical History:  ?Procedure Laterality Date  ? NO PAST SURGERIES    ? Echelon EXTRACTION  2007  ? ? ?Family History  ?Problem Relation Age of Onset  ? Hypertension Mother   ? Hyperlipidemia Mother   ? Melanoma Father   ? Alcohol abuse Father   ? Cancer Maternal Grandfather   ? Diabetes Paternal Grandmother   ? Cancer Paternal Grandmother   ? Diabetes Paternal Grandfather   ? Brain cancer Other   ? ? ?Social History  ? ?Tobacco Use  ? Smoking status: Former  ?  Packs/day: 0.50  ?  Types: Cigarettes  ?  Quit date: 04/23/2015  ?  Years since quitting: 5.9  ? Smokeless tobacco: Never  ?Vaping Use  ? Vaping Use: Never used  ?Substance Use Topics  ? Alcohol use: No  ? Drug use: No  ? ? ?Allergies:   ?Allergies  ?Allergen Reactions  ? Bee Venom Anaphylaxis  ? ? ?Medications Prior to Admission  ?Medication Sig Dispense Refill Last Dose  ? aspirin EC 81 MG tablet Take 1 tablet (81 mg total) by mouth daily. Take after 12 weeks for prevention of preeclampsia later in pregnancy 300 tablet 2 04/19/2021  ? Blood Pressure Monitoring (BLOOD PRESSURE KIT) DEVI 1 kit by Does not apply route once a week. 1 each 0 04/19/2021  ? cyclobenzaprine (FLEXERIL) 10 MG tablet Take 1 tablet (10 mg total) by mouth 3 (three) times daily as needed for muscle spasms. 30 tablet 2 Past Week  ? cyproheptadine (PERIACTIN) 4 MG tablet Take 1 tablet (4 mg total) by mouth at bedtime. 30 tablet 3 04/19/2021  ? labetalol (NORMODYNE) 200 MG tablet Take 2 tablets (400 mg total) by mouth 2 (two) times daily. 120 tablet 3 04/19/2021  ? Misc. Devices (GOJJI WEIGHT SCALE) MISC 1 Device by Does not apply route every 30 (thirty) days. 1 each 0 Past Week  ? Prenatal Vit-Fe Fumarate-FA (PRENATAL MULTIVITAMIN) TABS tablet Take 1 tablet by mouth daily at 12 noon.   04/19/2021  ? ? ?Review of Systems  ?Constitutional:  Positive for fatigue. Negative for fever.  ?Respiratory:  Negative for shortness of breath.   ?Cardiovascular:  Negative for chest pain.  ?Gastrointestinal:  Positive for nausea and vomiting. Negative for abdominal distention, abdominal pain, constipation and diarrhea.  ?Genitourinary:  Negative for dysuria, vaginal bleeding and vaginal discharge.  ?Musculoskeletal:  Positive for myalgias.  ?Skin:  Negative for pallor and rash.  ?Neurological:  Negative for dizziness, weakness and light-headedness.  ?Psychiatric/Behavioral:  Negative for behavioral problems.   ?Physical Exam  ? ?Blood pressure 133/81, pulse (!) 101, temperature 99 ?F (37.2 ?C), temperature source Oral, resp. rate 18, height 5' 6"  (1.676 m), weight 96.3 kg, last menstrual period 12/03/2020, SpO2 96 %. ? ?Physical Exam ?Constitutional:   ?   General: She is not in acute distress. ?    Appearance: Normal appearance. She is not diaphoretic.  ?   Comments: Appears tired but nontoxic.  ?HENT:  ?   Head: Normocephalic and atraumatic.  ?   Nose: Nose normal.  ?   Mouth/Throat:  ?   Comments: Overall appear moist ?Eyes:  ?   Extraocular Movements: Extraocular movements intact.  ?Cardiovascular:  ?   Pulses: Normal pulses.  ?Pulmonary:  ?   Effort: Pulmonary effort is normal.  ?Abdominal:  ?   Comments: Soft, nondistended.  Reports general achiness with palpation around entire abdomen without any rebounding or guarding, no pinpoint tenderness.  No localized tenderness in McBurney's point.  ?Skin: ?   General: Skin is warm.  ?   Capillary Refill: Capillary refill takes less than 2 seconds.  ?   Findings: No rash.  ?Neurological:  ?   Mental Status: She is alert and oriented to person, place, and time.  ?Psychiatric:     ?   Behavior: Behavior normal.  ? ? ?MAU Course  ? ?MDM ?History of mild transaminitis in 02/2021 (AST/ALT 70's), will obtain CMP today ?Through shared decision making on trying oral antiemetic versus IV fluids with IV antinausea, she opted to try oral Phenergan first. ? ?Reassessed at 1225: Reports she is feeling less nauseous with the Phenergan.  Has been able to tolerate ice and water without emesis.  ? ?Reassessed at 0100 after labs returned.  Patient resting/sleeping comfortably in bed.  Ready to go home. ? ?Assessment and Plan  ? ?1. Viral gastroenteritis ?2. Nausea/vomiting in pregnancy ?Clinical presentation with known sick contacts suspicious for viral GI illness.  Able to tolerate oral liquids after Phenergan.  Benign abdomen on exam.  Rx'd Phenergan for home.  Encouraged small sips of fluid throughout the day and bland food as tolerated. ? ?3. [redacted] weeks gestation of pregnancy ?No OB related complaints. + FHT.  ? ?4. History of mild transaminitis  ?Repeat CMP today with normal AST/ALT.  Provided reassurance. ? ?Discharged home in stable condition.  MAU return precautions strictly  discussed. ?  ? ?Patriciaann Clan ?04/20/2021, 12:39 PM  ?

## 2021-04-20 NOTE — MAU Note (Signed)
Michele Gardner is a 36 y.o. at [redacted]w[redacted]d here in MAU reporting: started vomiting last night. States daughter and boyfriend have also had vomiting over the past few days. States she cannot keep anything down and is having generalized body pain. Emesis x 3. No vaginal bleeding or LOF. No diarrhea.  ? ?Onset of complaint: last night ? ?Pain score: 9/10 ? ?Vitals:  ? 04/20/21 1024  ?BP: 133/81  ?Pulse: (!) 101  ?Resp: 18  ?Temp: 99 ?F (37.2 ?C)  ?SpO2: 96%  ?   ?FHT:158 ? ?Lab orders placed from triage: UA ? ?

## 2021-04-20 NOTE — Telephone Encounter (Signed)
S/w patient and she advised that she has been having N&V, chills, and sweats. Pt stated that her daughter and boyfriend have the same symptoms. Advised pt that she needs to be tested for covid/flu. Offered to send antiemetic, pt declined because she said that she cannot take anything due to vomiting. Advised to go to the hospital. ?

## 2021-04-29 ENCOUNTER — Other Ambulatory Visit: Payer: Self-pay

## 2021-04-29 ENCOUNTER — Encounter: Payer: Self-pay | Admitting: Physician Assistant

## 2021-04-29 ENCOUNTER — Ambulatory Visit (INDEPENDENT_AMBULATORY_CARE_PROVIDER_SITE_OTHER): Payer: Medicaid Other | Admitting: Physician Assistant

## 2021-04-29 VITALS — BP 125/77 | HR 91

## 2021-04-29 DIAGNOSIS — O10919 Unspecified pre-existing hypertension complicating pregnancy, unspecified trimester: Secondary | ICD-10-CM | POA: Diagnosis not present

## 2021-04-29 DIAGNOSIS — G43009 Migraine without aura, not intractable, without status migrainosus: Secondary | ICD-10-CM | POA: Insufficient documentation

## 2021-04-29 DIAGNOSIS — O26892 Other specified pregnancy related conditions, second trimester: Secondary | ICD-10-CM

## 2021-04-29 DIAGNOSIS — Z3A20 20 weeks gestation of pregnancy: Secondary | ICD-10-CM | POA: Diagnosis not present

## 2021-04-29 DIAGNOSIS — O10912 Unspecified pre-existing hypertension complicating pregnancy, second trimester: Secondary | ICD-10-CM | POA: Diagnosis not present

## 2021-04-29 DIAGNOSIS — R519 Headache, unspecified: Secondary | ICD-10-CM | POA: Diagnosis not present

## 2021-04-29 MED ORDER — METOCLOPRAMIDE HCL 10 MG PO TABS: 10.0000 mg | ORAL_TABLET | Freq: Three times a day (TID) | ORAL | 1 refills | Status: DC | PRN

## 2021-04-29 MED ORDER — CYCLOBENZAPRINE HCL 10 MG PO TABS: 10.0000 mg | ORAL_TABLET | Freq: Three times a day (TID) | ORAL | 2 refills | Status: DC | PRN

## 2021-04-29 MED ORDER — BUTALBITAL-APAP-CAFFEINE 50-325-40 MG PO CAPS: 1.0000 | ORAL_CAPSULE | Freq: Four times a day (QID) | ORAL | 2 refills | Status: AC | PRN

## 2021-04-29 NOTE — Patient Instructions (Signed)
Analgesic Rebound Headache ?An analgesic rebound headache, sometimes called a medication overuse headache or a drug-induced headache, is a secondary disorder that is caused by the overuse of pain medicine (analgesic) to treat the original (primary) headache. Any type of primary headache can return as a rebound headache if a person regularly takes analgesics. ?The types of primary headaches that are commonly associated with rebound headaches include: ?Migraines. ?Headaches that are caused by tense muscles in the head and neck area (tension headaches). ?Headaches that develop and happen again on one side of the head and around the eye (cluster headaches). ?If rebound headaches continue, they can become long-term, daily headaches. ?What are the causes? ?This condition may be caused by frequent use of: ?Over-the-counter medicines such as aspirin, ibuprofen, and acetaminophen. ?Sinus-relief medicines and medicines that contain caffeine. ?Narcotic pain medicines such as codeine and oxycodone. ?Some prescription migraine medicines. ?What are the signs or symptoms? ?The symptoms of a rebound headache are the same as the symptoms of the original headache. Some of the symptoms of specific types of headaches include: ?Migraine headache ?Pulsing or throbbing pain on one or both sides of the head. ?Severe pain that interferes with daily activities. ?Pain that gets worse with physical activity. ?Nausea, vomiting, or both. ?Pain and sensitivity with exposure to bright light, loud noises, or strong smells. ?Visual changes. ?Numbness of one or both arms. ?Tension headache ?Pressure around the head. ?Dull, aching head pain. ?Pain felt over the front and sides of the head. ?Tenderness in the muscles of the head, neck, and shoulders. ?Cluster headache ?Severe pain that begins in or around one eye or temple. ?Droopy or swollen eyelid, or redness and tearing in the eye on the same side as the pain. ?One-sided head pain. ?Nausea. ?Runny  nose. ?Sweaty, pale facial skin. ?Restlessness. ?How is this diagnosed? ?This condition is diagnosed by: ?Reviewing your medical history. This includes the nature of your primary headaches. ?Reviewing the types of pain medicines that you have been using to treat your primary headaches and how often you take them. ?How is this treated? ?This condition may be treated or managed by: ?Discontinuing frequent use of the analgesic medicine. Doing this may worsen your headaches at first, but the pain should eventually become more manageable, less frequent, and less severe. ?Seeing a headache specialist. He or she may be able to help you manage your headaches and help make sure there is not another cause of the headaches. ?Using methods of stress relief, such as acupuncture, counseling, biofeedback, and massage. Talk with your health care provider about which methods might be good for you. ?Follow these instructions at home: ?Medicines ? ?Take over-the-counter and prescription medicines only as told by your health care provider. ?Stop the repeated use of pain medicine as told by your health care provider. Stopping can be difficult. Carefully follow instructions from your health care provider. ?Lifestyle ? ?Follow a regular sleep schedule. Do not vary the time that you go to bed or the amount that you sleep from day to day. It is important to stay on the same schedule to help prevent headaches. Get 7-9 hours of sleep each night, or the amount recommended by your health care provider. ?Exercise regularly. Exercise for at least 30 minutes, 5 times each week. ?Limit or manage stress. Consider stress-relief options such as acupuncture, counseling, biofeedback, and massage. Talk with your health care provider about which methods might be good for you. ?Do not drink alcohol. ?Do not use any products that contain nicotine  or tobacco, such as cigarettes, e-cigarettes, and chewing tobacco. If you need help quitting, ask your health  care provider. ?General instructions ?Avoid triggers that are known to cause your primary headaches. ?Keep all follow-up visits as told by your health care provider. This is important. ?Contact a health care provider if: ?You continue to experience headaches after following treatments that your health care provider recommended. ?Get help right away if you have: ?New headache pain. ?Headache pain that is different than what you have experienced in the past. ?Numbness or tingling in your arms or legs. ?Changes in your speech or vision. ?Summary ?An analgesic rebound headache, sometimes called a medication overuse headache or a drug-induced headache, is caused by the overuse of pain medicine (analgesic) to treat the original (primary) headache. ?Any type of primary headache can return as a rebound headache if a person regularly takes analgesics. The types of primary headaches that are commonly associated with rebound headaches include migraines, tension headaches, and cluster headaches. ?Analgesic rebound headaches can occur with frequent use of over-the-counter medicines and prescription medicines. ?Treatment involves stopping the medicine that is being overused. This will improve headache frequency and severity. ?This information is not intended to replace advice given to you by your health care provider. Make sure you discuss any questions you have with your health care provider. ?Document Revised: 02/27/2019 Document Reviewed: 02/27/2019 ?Elsevier Patient Education ? [place]. ?Migraine Headache ?A migraine headache is an intense, throbbing pain on one side or both sides of the head. Migraine headaches may also cause other symptoms, such as nausea, vomiting, and sensitivity to light and noise. A migraine headache can last from 4 hours to 3 days. Talk with your doctor about what things may bring on (trigger) your migraine headaches. ?What are the causes? ?The exact cause of this condition is not known.  However, a migraine may be caused when nerves in the brain become irritated and release chemicals that cause inflammation of blood vessels. This inflammation causes pain. This condition may be triggered or caused by: ?Drinking alcohol. ?Smoking. ?Taking medicines, such as: ?Medicine used to treat chest pain (nitroglycerin). ?Birth control pills. ?Estrogen. ?Certain blood pressure medicines. ?Eating or drinking products that contain nitrates, glutamate, aspartame, or tyramine. Aged cheeses, chocolate, or caffeine may also be triggers. ?Doing physical activity. ?Other things that may trigger a migraine headache include: ?Menstruation. ?Pregnancy. ?Hunger. ?Stress. ?Lack of sleep or too much sleep. ?Weather changes. ?Fatigue. ?What increases the risk? ?The following factors may make you more likely to experience migraine headaches: ?Being a certain age. This condition is more common in people who are 16-63 years old. ?Being female. ?Having a family history of migraine headaches. ?Being Caucasian. ?Having a mental health condition, such as depression or anxiety. ?Being obese. ?What are the signs or symptoms? ?The main symptom of this condition is pulsating or throbbing pain. This pain may: ?Happen in any area of the head, such as on one side or both sides. ?Interfere with daily activities. ?Get worse with physical activity. ?Get worse with exposure to bright lights or loud noises. ?Other symptoms may include: ?Nausea. ?Vomiting. ?Dizziness. ?General sensitivity to bright lights, loud noises, or smells. ?Before you get a migraine headache, you may get warning signs (an aura). An aura may include: ?Seeing flashing lights or having blind spots. ?Seeing bright spots, halos, or zigzag lines. ?Having tunnel vision or blurred vision. ?Having numbness or a tingling feeling. ?Having trouble talking. ?Having muscle weakness. ?Some people have symptoms after a migraine headache (  postdromal phase), such as: ?Feeling  tired. ?Difficulty concentrating. ?How is this diagnosed? ?A migraine headache can be diagnosed based on: ?Your symptoms. ?A physical exam. ?Tests, such as: ?CT scan or an MRI of the head. These imaging tests can help rule

## 2021-04-29 NOTE — Progress Notes (Signed)
History:  ?Michele Gardner is a 36 y.o. [phone] , [redacted] weeks gestation who presents to clinic today for evaluation of headache, new to this provider.  She has seen neurology twice, started on periactin for prevention in early Feb.  She has not seen benefit from that.  She had nerve blocks that were helpful but the improvement came 2 weeks s/p injections.  Recently went to ED for headache, had MRI and MRV, both normal.   ?Pain located top of head - squeezing or clenching at top of head - there is throbbing - movement makes it worse along with light, noise, smells.  There is nausea and usually not vomiting.  There is dizziness.   ?She sees spots - twinkling stars that are white.  This occurs most any time she closes her eyes and is not directly associated with headache. ?Headaches first started when she was 17, shortly after having wisdom teeth removed. ?When not pregnant, she has used excedrin daily.  She has tried tylenol but this is not helpful.  ?Triggers include smells, some foods,  ?She was having daily headaches but they improved last week.   ?Rest, quiet can help a little.  Ice, heat, shower, tylenol, sumatriptan, ibuprofen, naprosyn all not helpful.  ? ? ?Past Medical History:  ?Diagnosis Date  ? Asthma   ? Hypertension   ? Medical history non-contributory   ? ? ?Social History  ? ?Socioeconomic History  ? Marital status: Single  ?  Spouse name: Not on file  ? Number of children: Not on file  ? Years of education: Not on file  ? Highest education level: Not on file  ?Occupational History  ? Not on file  ?Tobacco Use  ? Smoking status: Former  ?  Packs/day: 0.50  ?  Types: Cigarettes  ?  Quit date: 04/23/2015  ?  Years since quitting: 6.0  ? Smokeless tobacco: Never  ?Vaping Use  ? Vaping Use: Never used  ?Substance and Sexual Activity  ? Alcohol use: No  ? Drug use: No  ? Sexual activity: Yes  ?  Partners: Male  ?  Birth control/protection: None  ?Other Topics Concern  ? Not on file  ?Social History Narrative  ?  Not on file  ? ?Social Determinants of Health  ? ?Financial Resource Strain: Not on file  ?Food Insecurity: Not on file  ?Transportation Needs: Not on file  ?Physical Activity: Not on file  ?Stress: Not on file  ?Social Connections: Not on file  ?Intimate Partner Violence: Not on file  ? ? ?Family History  ?Problem Relation Age of Onset  ? Hypertension Mother   ? Hyperlipidemia Mother   ? Melanoma Father   ? Alcohol abuse Father   ? Cancer Maternal Grandfather   ? Diabetes Paternal Grandmother   ? Cancer Paternal Grandmother   ? Diabetes Paternal Grandfather   ? Brain cancer Other   ? ? ?Allergies  ?Allergen Reactions  ? Bee Venom Anaphylaxis  ? ? ?Current Outpatient Medications on File Prior to Visit  ?Medication Sig Dispense Refill  ? aspirin EC 81 MG tablet Take 1 tablet (81 mg total) by mouth daily. Take after 12 weeks for prevention of preeclampsia later in pregnancy 300 tablet 2  ? cyclobenzaprine (FLEXERIL) 10 MG tablet Take 1 tablet (10 mg total) by mouth 3 (three) times daily as needed for muscle spasms. 30 tablet 2  ? cyproheptadine (PERIACTIN) 4 MG tablet Take 1 tablet (4 mg total) by mouth at bedtime.  30 tablet 3  ? labetalol (NORMODYNE) 200 MG tablet Take 2 tablets (400 mg total) by mouth 2 (two) times daily. 120 tablet 3  ? Prenatal Vit-Fe Fumarate-FA (PRENATAL MULTIVITAMIN) TABS tablet Take 1 tablet by mouth daily at 12 noon.    ? Blood Pressure Monitoring (BLOOD PRESSURE KIT) DEVI 1 kit by Does not apply route once a week. 1 each 0  ? Misc. Devices (GOJJI WEIGHT SCALE) MISC 1 Device by Does not apply route every 30 (thirty) days. 1 each 0  ? promethazine (PHENERGAN) 25 MG tablet Take 1 tablet (25 mg total) by mouth every 6 (six) hours as needed for nausea, vomiting or refractory nausea / vomiting. (Patient not taking: Reported on 04/29/2021) 15 tablet 0  ? ?No current facility-administered medications on file prior to visit.  ? ? ? ?Review of Systems:  ?All pertinent positive/negative included in HPI,  all other review of systems are negative ?  ?Objective:  ?Physical Exam ?BP 125/77   Pulse 91   LMP 12/03/2020 (Approximate)  ?CONSTITUTIONAL: Well-developed, well-nourished female in no acute distress.  ?EYES: EOM intact ?ENT: Normocephalic ?CARDIOVASCULAR: Regular rate and rhythm with no adventitious sounds.  ?RESPIRATORY: Normal rate.  ?MUSCULOSKELETAL: Normal ROM ?SKIN: Warm, dry without erythema  ?NEUROLOGICAL: Alert, oriented, CN II-XII grossly intact, Appropriate balance ?PSYCH: Normal behavior, mood ? ?MRI/MRA from 03/25/21 reviewed. ?Prior consultation/procedure with Dr. Billey Gosling of neuro reviewed.   ?Assessment & Plan:  ?Assessment: ?1. Migraine without aura and without status migrainosus, not intractable   ?2. Chronic hypertension affecting pregnancy   ?3. Pregnancy headache in second trimester   ? ? ? ?Plan: ?Continue with periactin for migraine prevention ?Continue labetalol - bp controlled currently ?Hydration, exercise, maintenance of regular schedule and other healthy habits encouraged ?Pt likely was dealing with rebound headache when she was using daily excedrin.  Education provided and these habits are discouraged.  It is possible the nerve blocks were what helped break that headache cycle finally for her.   ?Follow-up in 3 months or sooner PRN (unless she decides to continue treatment of headache with neurology, which she is aware is a very reasonable choice). ? ?Paticia Stack, PA-C ?04/29/2021 ?9:49 AM  ?

## 2021-05-03 ENCOUNTER — Telehealth: Payer: Self-pay

## 2021-05-03 ENCOUNTER — Encounter: Payer: Medicaid Other | Admitting: Obstetrics and Gynecology

## 2021-05-03 NOTE — Telephone Encounter (Signed)
Returned call and advised of safe OTC allergy meds in pregnancy ?

## 2021-05-06 ENCOUNTER — Telehealth: Payer: Self-pay

## 2021-05-06 NOTE — Telephone Encounter (Signed)
Received voicemail from patient c/o cold sx's, nosebleed, and green nasal discharge.  ?No relief with OTC medications ?Consulted with MD, pt to report to Urgent Care for treatment. Pt agrees and has no further questions.  ?

## 2021-05-11 ENCOUNTER — Other Ambulatory Visit: Payer: Self-pay

## 2021-05-11 ENCOUNTER — Ambulatory Visit (INDEPENDENT_AMBULATORY_CARE_PROVIDER_SITE_OTHER): Payer: Medicaid Other | Admitting: Obstetrics and Gynecology

## 2021-05-11 VITALS — BP 122/86 | HR 112 | Wt 214.0 lb

## 2021-05-11 DIAGNOSIS — O0990 Supervision of high risk pregnancy, unspecified, unspecified trimester: Secondary | ICD-10-CM

## 2021-05-11 DIAGNOSIS — Z3A22 22 weeks gestation of pregnancy: Secondary | ICD-10-CM

## 2021-05-11 DIAGNOSIS — O10919 Unspecified pre-existing hypertension complicating pregnancy, unspecified trimester: Secondary | ICD-10-CM

## 2021-05-11 NOTE — Progress Notes (Signed)
? ?  PRENATAL VISIT NOTE ? ?Subjective:  ?Michele Gardner is a 36 y.o. [phone] at [redacted]w[redacted]d being seen today for ongoing prenatal care.  She is currently monitored for the following issues for this high-risk pregnancy and has Benign essential hypertension; Migraine with aura and without status migrainosus, not intractable; Gastroesophageal reflux disease; Myalgia; Chronic hypertension affecting pregnancy; Supervision of high risk pregnancy, antepartum; Abnormal TSH; Known fetal anomaly - echogenic bowel; Migraine without aura and without status migrainosus, not intractable; and Pregnancy headache in second trimester on their problem list. ? ?Patient doing well with no acute concerns today. She reports no complaints.  Contractions: Not present. Vag. Bleeding: None.  Movement: Present. Denies leaking of fluid.  ? ?The following portions of the patient's history were reviewed and updated as appropriate: allergies, current medications, past family history, past medical history, past social history, past surgical history and problem list. Problem list updated. ? ?Objective:  ? ?Vitals:  ? 05/11/21 1034  ?BP: 122/86  ?Pulse: (!) 112  ?Weight: 214 lb (97.1 kg)  ? ? ?Fetal Status: Fetal Heart Rate (bpm): 140 Fundal Height: 22 cm Movement: Present    ? ?General:  Alert, oriented and cooperative. Patient is in no acute distress.  ?Skin: Skin is warm and dry. No rash noted.   ?Cardiovascular: Normal heart rate noted  ?Respiratory: Normal respiratory effort, no problems with respiration noted  ?Abdomen: Soft, gravid, appropriate for gestational age.  Pain/Pressure: Absent     ?Pelvic: Cervical exam deferred        ?Extremities: Normal range of motion.     ?Mental Status:  Normal mood and affect. Normal behavior. Normal judgment and thought content.  ? ?Assessment and Plan:  ?Pregnancy: [id number] at [redacted]w[redacted]d ? ?1. [redacted] weeks gestation of pregnancy ? ? ?2. Chronic hypertension affecting pregnancy ?Pt notes compliance with her labetalol and  baby ASA, DBP high normal today ?Growth scan on 05/13/21 ? ?3. Supervision of high risk pregnancy, antepartum ?Continue routine care ?2 hour GTT next visit ? ?Preterm labor symptoms and general obstetric precautions including but not limited to vaginal bleeding, contractions, leaking of fluid and fetal movement were reviewed in detail with the patient. ? ?Please refer to After Visit Summary for other counseling recommendations.  ? ?Return in about 4 weeks (around 06/08/2021) for in person, 2 hr GTT, 3rd trim labs, HOB. ? ? ?Lynnda Shields, MD ?Faculty Attending ?[facility] ?  ?

## 2021-05-13 ENCOUNTER — Encounter: Payer: Self-pay | Admitting: *Deleted

## 2021-05-13 ENCOUNTER — Ambulatory Visit: Payer: Medicaid Other | Attending: Obstetrics

## 2021-05-13 ENCOUNTER — Ambulatory Visit: Payer: Medicaid Other | Admitting: *Deleted

## 2021-05-13 ENCOUNTER — Other Ambulatory Visit: Payer: Self-pay | Admitting: *Deleted

## 2021-05-13 VITALS — BP 119/75 | HR 90

## 2021-05-13 DIAGNOSIS — O10912 Unspecified pre-existing hypertension complicating pregnancy, second trimester: Secondary | ICD-10-CM

## 2021-05-13 DIAGNOSIS — O10012 Pre-existing essential hypertension complicating pregnancy, second trimester: Secondary | ICD-10-CM | POA: Diagnosis not present

## 2021-05-13 DIAGNOSIS — O358XX0 Maternal care for other (suspected) fetal abnormality and damage, not applicable or unspecified: Secondary | ICD-10-CM

## 2021-05-13 DIAGNOSIS — O0990 Supervision of high risk pregnancy, unspecified, unspecified trimester: Secondary | ICD-10-CM | POA: Insufficient documentation

## 2021-05-13 DIAGNOSIS — O359XX0 Maternal care for (suspected) fetal abnormality and damage, unspecified, not applicable or unspecified: Secondary | ICD-10-CM | POA: Diagnosis present

## 2021-05-13 DIAGNOSIS — O283 Abnormal ultrasonic finding on antenatal screening of mother: Secondary | ICD-10-CM | POA: Diagnosis not present

## 2021-05-13 DIAGNOSIS — O35DXX0 Maternal care for other (suspected) fetal abnormality and damage, fetal gastrointestinal anomalies, not applicable or unspecified: Secondary | ICD-10-CM

## 2021-05-13 DIAGNOSIS — Z3A23 23 weeks gestation of pregnancy: Secondary | ICD-10-CM | POA: Diagnosis not present

## 2021-05-13 DIAGNOSIS — G43109 Migraine with aura, not intractable, without status migrainosus: Secondary | ICD-10-CM

## 2021-06-03 ENCOUNTER — Telehealth: Payer: Self-pay | Admitting: Emergency Medicine

## 2021-06-03 ENCOUNTER — Encounter: Payer: Self-pay | Admitting: Obstetrics

## 2021-06-03 ENCOUNTER — Ambulatory Visit (INDEPENDENT_AMBULATORY_CARE_PROVIDER_SITE_OTHER): Payer: Medicaid Other | Admitting: Obstetrics

## 2021-06-03 VITALS — BP 138/85 | HR 92 | Wt 219.4 lb

## 2021-06-03 DIAGNOSIS — O99012 Anemia complicating pregnancy, second trimester: Secondary | ICD-10-CM | POA: Diagnosis not present

## 2021-06-03 DIAGNOSIS — O0992 Supervision of high risk pregnancy, unspecified, second trimester: Secondary | ICD-10-CM | POA: Diagnosis not present

## 2021-06-03 DIAGNOSIS — O10919 Unspecified pre-existing hypertension complicating pregnancy, unspecified trimester: Secondary | ICD-10-CM

## 2021-06-03 DIAGNOSIS — Z3A26 26 weeks gestation of pregnancy: Secondary | ICD-10-CM | POA: Diagnosis not present

## 2021-06-03 DIAGNOSIS — O0990 Supervision of high risk pregnancy, unspecified, unspecified trimester: Secondary | ICD-10-CM | POA: Diagnosis not present

## 2021-06-03 DIAGNOSIS — G43109 Migraine with aura, not intractable, without status migrainosus: Secondary | ICD-10-CM

## 2021-06-03 NOTE — Progress Notes (Signed)
Subjective:  ?Michele Gardner is a 36 y.o. [phone] at [redacted]w[redacted]d being seen today for ongoing prenatal care.  She is currently monitored for the following issues for this high-risk pregnancy and has Benign essential hypertension; Migraine with aura and without status migrainosus, not intractable; Gastroesophageal reflux disease; Myalgia; Chronic hypertension affecting pregnancy; Supervision of high risk pregnancy, antepartum; Abnormal TSH; Known fetal anomaly - echogenic bowel; Migraine without aura and without status migrainosus, not intractable; and Pregnancy headache in second trimester on their problem list. ? ?Patient reports heartburn.  Contractions: Not present. Vag. Bleeding: None.  Movement: Present. Denies leaking of fluid.  ? ?The following portions of the patient's history were reviewed and updated as appropriate: allergies, current medications, past family history, past medical history, past social history, past surgical history and problem list. Problem list updated. ? ?Objective:  ? ?Vitals:  ? 06/03/21 0852  ?BP: 138/85  ?Pulse: 92  ?Weight: 219 lb 6.4 oz (99.5 kg)  ? ? ?Fetal Status: Fetal Heart Rate (bpm): 136   Movement: Present    ? ?General:  Alert, oriented and cooperative. Patient is in no acute distress.  ?Skin: Skin is warm and dry. No rash noted.   ?Cardiovascular: Normal heart rate noted  ?Respiratory: Normal respiratory effort, no problems with respiration noted  ?Abdomen: Soft, gravid, appropriate for gestational age. Pain/Pressure: Absent     ?Pelvic:  Cervical exam deferred        ?Extremities: Normal range of motion.  Edema: Trace  ?Mental Status: Normal mood and affect. Normal behavior. Normal judgment and thought content.  ? ?Urinalysis:     ? ?Assessment and Plan:  ?Pregnancy: [id number] at [redacted]w[redacted]d ? ?1. Supervision of high risk pregnancy, antepartum ?Rx: ?- CBC ?- TSH ?- Comprehensive metabolic panel ?- Ferritin ? ?2. Migraine with aura and without status migrainosus, not intractable ? ?3.  Chronic hypertension affecting pregnancy ?  ? ?Preterm labor symptoms and general obstetric precautions including but not limited to vaginal bleeding, contractions, leaking of fluid and fetal movement were reviewed in detail with the patient. ?Please refer to After Visit Summary for other counseling recommendations.  ? ?Return in about 2 weeks (around 06/17/2021) for [facility], 2 hour OGTT. ? ? ?Shelly Bombard, MD  ?

## 2021-06-03 NOTE — Progress Notes (Signed)
Pt in office for ROB visit. Pt c/o feeling lightheaded at random times over last 2 days. She is wondering if her iron could be low. She has also been feeling weak and states she has been craving ice over the ;last 2 weeks.  ?

## 2021-06-03 NOTE — Telephone Encounter (Signed)
Incoming call from [employer] scripts reporting BP reading of 132/95 today.  ?Pt seen in office today, BP addressed.  ?

## 2021-06-04 ENCOUNTER — Other Ambulatory Visit: Payer: Self-pay | Admitting: Obstetrics

## 2021-06-04 DIAGNOSIS — D508 Other iron deficiency anemias: Secondary | ICD-10-CM

## 2021-06-04 LAB — CBC
Hematocrit: 31.1 % — ABNORMAL LOW (ref 34.0–46.6)
Hemoglobin: 10.3 g/dL — ABNORMAL LOW (ref 11.1–15.9)
MCH: 29.4 pg (ref 26.6–33.0)
MCHC: 33.1 g/dL (ref 31.5–35.7)
MCV: 89 fL (ref 79–97)
Platelets: 284 10*3/uL (ref 150–450)
RBC: 3.5 x10E6/uL — ABNORMAL LOW (ref 3.77–5.28)
RDW: 12.6 % (ref 11.7–15.4)
WBC: 10.4 10*3/uL (ref 3.4–10.8)

## 2021-06-04 LAB — COMPREHENSIVE METABOLIC PANEL
ALT: 19 IU/L (ref 0–32)
AST: 22 IU/L (ref 0–40)
Albumin/Globulin Ratio: 1.3 (ref 1.2–2.2)
Albumin: 3.5 g/dL — ABNORMAL LOW (ref 3.8–4.8)
Alkaline Phosphatase: 72 IU/L (ref 44–121)
BUN/Creatinine Ratio: 11 (ref 9–23)
BUN: 7 mg/dL (ref 6–20)
Bilirubin Total: <0.2 mg/dL (ref 0.0–1.2)
CO2: 19 mmol/L — ABNORMAL LOW (ref 20–29)
Calcium: 8.5 mg/dL — ABNORMAL LOW (ref 8.7–10.2)
Chloride: 105 mmol/L (ref 96–106)
Creatinine, Ser: 0.66 mg/dL (ref 0.57–1.00)
Globulin, Total: 2.6 g/dL (ref 1.5–4.5)
Glucose: 87 mg/dL (ref 70–99)
Potassium: 4.2 mmol/L (ref 3.5–5.2)
Sodium: 138 mmol/L (ref 134–144)
Total Protein: 6.1 g/dL (ref 6.0–8.5)
eGFR: 119 mL/min/{1.73_m2} (ref >59)

## 2021-06-04 LAB — TSH: TSH: 0.528 u[IU]/mL (ref 0.450–4.500)

## 2021-06-04 LAB — FERRITIN: Ferritin: 12 ng/mL — ABNORMAL LOW (ref 15–150)

## 2021-06-04 MED ORDER — IRON POLYSACCH CMPLX-B12-FA 150-0.025-1 MG PO CAPS: 1.0000 | ORAL_CAPSULE | ORAL | 5 refills | Status: DC

## 2021-06-07 ENCOUNTER — Telehealth: Payer: Self-pay

## 2021-06-07 NOTE — Telephone Encounter (Signed)
Attempted to contact about results and rx, no answer, left vm 

## 2021-06-08 ENCOUNTER — Other Ambulatory Visit: Payer: Medicaid Other

## 2021-06-08 ENCOUNTER — Encounter: Payer: Self-pay | Admitting: Obstetrics and Gynecology

## 2021-06-08 ENCOUNTER — Ambulatory Visit (INDEPENDENT_AMBULATORY_CARE_PROVIDER_SITE_OTHER): Payer: Medicaid Other | Admitting: Obstetrics and Gynecology

## 2021-06-08 VITALS — BP 139/96 | HR 99 | Wt 221.0 lb

## 2021-06-08 DIAGNOSIS — O10919 Unspecified pre-existing hypertension complicating pregnancy, unspecified trimester: Secondary | ICD-10-CM

## 2021-06-08 DIAGNOSIS — Z3492 Encounter for supervision of normal pregnancy, unspecified, second trimester: Secondary | ICD-10-CM | POA: Diagnosis not present

## 2021-06-08 DIAGNOSIS — Z3A28 28 weeks gestation of pregnancy: Secondary | ICD-10-CM

## 2021-06-08 DIAGNOSIS — Z23 Encounter for immunization: Secondary | ICD-10-CM

## 2021-06-08 DIAGNOSIS — O0990 Supervision of high risk pregnancy, unspecified, unspecified trimester: Secondary | ICD-10-CM

## 2021-06-08 NOTE — Patient Instructions (Signed)

## 2021-06-08 NOTE — Progress Notes (Signed)
ROB 26.[redacted] wks GA ?GTT, CBC, HIV, RPR today ?TDAP offered and accepted ?RH positive ?Depression and anxiety screen negative ?Has not had BP meds today/ fasting ?

## 2021-06-08 NOTE — Progress Notes (Signed)
Subjective:  ?Michele Gardner is a 36 y.o. [phone] at [redacted]w[redacted]d being seen today for ongoing prenatal care.  She is currently monitored for the following issues for this high-risk pregnancy and has Benign essential hypertension; Migraine with aura and without status migrainosus, not intractable; Gastroesophageal reflux disease; Myalgia; Chronic hypertension affecting pregnancy; Supervision of high risk pregnancy, antepartum; Abnormal TSH; Known fetal anomaly - echogenic bowel; Migraine without aura and without status migrainosus, not intractable; and Pregnancy headache in second trimester on their problem list. ? ?Patient reports general discomforts of pregnancy.  Contractions: Not present. Vag. Bleeding: None.  Movement: Present. Denies leaking of fluid.  ? ?The following portions of the patient's history were reviewed and updated as appropriate: allergies, current medications, past family history, past medical history, past social history, past surgical history and problem list. Problem list updated. ? ?Objective:  ? ?Vitals:  ? 06/08/21 0857  ?BP: (!) 139/96  ?Pulse: 99  ?Weight: 221 lb (100.2 kg)  ? ? ?Fetal Status: Fetal Heart Rate (bpm): 140   Movement: Present    ? ?General:  Alert, oriented and cooperative. Patient is in no acute distress.  ?Skin: Skin is warm and dry. No rash noted.   ?Cardiovascular: Normal heart rate noted  ?Respiratory: Normal respiratory effort, no problems with respiration noted  ?Abdomen: Soft, gravid, appropriate for gestational age. Pain/Pressure: Absent     ?Pelvic:  Cervical exam deferred        ?Extremities: Normal range of motion.  Edema: Trace  ?Mental Status: Normal mood and affect. Normal behavior. Normal judgment and thought content.  ? ?Urinalysis:     ? ?Assessment and Plan:  ?Pregnancy: [id number] at [redacted]w[redacted]d ? ?1. [redacted] weeks gestation of pregnancy ?Stable ?- Glucose Tolerance, 2 Hours w/1 Hour ?- CBC ?- RPR ?- HIV Antibody (routine testing w rflx) ? ?CHTN ?Stable. Has not taken BP  med this morning due to Glucola ?Continue with current management ?Serial growth scans and antenatal testing as per MFM ? ? ?Preterm labor symptoms and general obstetric precautions including but not limited to vaginal bleeding, contractions, leaking of fluid and fetal movement were reviewed in detail with the patient. ?Please refer to After Visit Summary for other counseling recommendations.  ?Return in about 4 weeks (around 07/06/2021) for OB visit, face to face, MD only. ? ? ?Hermina Staggers, MD ?

## 2021-06-09 ENCOUNTER — Encounter: Payer: Self-pay | Admitting: Obstetrics and Gynecology

## 2021-06-09 ENCOUNTER — Telehealth: Payer: Self-pay

## 2021-06-09 ENCOUNTER — Ambulatory Visit: Payer: Medicaid Other | Attending: Obstetrics and Gynecology

## 2021-06-09 ENCOUNTER — Encounter: Payer: Self-pay | Admitting: *Deleted

## 2021-06-09 ENCOUNTER — Other Ambulatory Visit: Payer: Self-pay | Admitting: *Deleted

## 2021-06-09 ENCOUNTER — Ambulatory Visit: Payer: Medicaid Other | Admitting: *Deleted

## 2021-06-09 VITALS — BP 133/84 | HR 96

## 2021-06-09 DIAGNOSIS — O402XX0 Polyhydramnios, second trimester, not applicable or unspecified: Secondary | ICD-10-CM | POA: Diagnosis not present

## 2021-06-09 DIAGNOSIS — O10012 Pre-existing essential hypertension complicating pregnancy, second trimester: Secondary | ICD-10-CM | POA: Diagnosis not present

## 2021-06-09 DIAGNOSIS — O359XX0 Maternal care for (suspected) fetal abnormality and damage, unspecified, not applicable or unspecified: Secondary | ICD-10-CM | POA: Insufficient documentation

## 2021-06-09 DIAGNOSIS — O35DXX0 Maternal care for other (suspected) fetal abnormality and damage, fetal gastrointestinal anomalies, not applicable or unspecified: Secondary | ICD-10-CM | POA: Diagnosis not present

## 2021-06-09 DIAGNOSIS — O10912 Unspecified pre-existing hypertension complicating pregnancy, second trimester: Secondary | ICD-10-CM

## 2021-06-09 DIAGNOSIS — O0990 Supervision of high risk pregnancy, unspecified, unspecified trimester: Secondary | ICD-10-CM

## 2021-06-09 DIAGNOSIS — O283 Abnormal ultrasonic finding on antenatal screening of mother: Secondary | ICD-10-CM

## 2021-06-09 DIAGNOSIS — Z3A27 27 weeks gestation of pregnancy: Secondary | ICD-10-CM

## 2021-06-09 DIAGNOSIS — G43109 Migraine with aura, not intractable, without status migrainosus: Secondary | ICD-10-CM | POA: Insufficient documentation

## 2021-06-09 LAB — GLUCOSE TOLERANCE, 2 HOURS W/ 1HR
Glucose, 1 hour: 151 mg/dL (ref 70–179)
Glucose, 2 hour: 101 mg/dL (ref 70–152)
Glucose, Fasting: 84 mg/dL (ref 70–91)

## 2021-06-09 LAB — CBC
Hematocrit: 33.7 % — ABNORMAL LOW (ref 34.0–46.6)
Hemoglobin: 11.1 g/dL (ref 11.1–15.9)
MCH: 28.7 pg (ref 26.6–33.0)
MCHC: 32.9 g/dL (ref 31.5–35.7)
MCV: 87 fL (ref 79–97)
Platelets: 304 10*3/uL (ref 150–450)
RBC: 3.87 x10E6/uL (ref 3.77–5.28)
RDW: 12.8 % (ref 11.7–15.4)
WBC: 12.3 10*3/uL — ABNORMAL HIGH (ref 3.4–10.8)

## 2021-06-09 LAB — HIV ANTIBODY (ROUTINE TESTING W REFLEX): HIV Screen 4th Generation wRfx: NONREACTIVE

## 2021-06-09 LAB — RPR: RPR Ser Ql: NONREACTIVE

## 2021-06-09 NOTE — Telephone Encounter (Signed)
S/w patient about results and rx ?

## 2021-06-09 NOTE — Progress Notes (Unsigned)
Us/

## 2021-06-22 ENCOUNTER — Ambulatory Visit: Payer: Medicaid Other | Admitting: Psychiatry

## 2021-07-02 ENCOUNTER — Other Ambulatory Visit: Payer: Self-pay

## 2021-07-02 ENCOUNTER — Inpatient Hospital Stay (HOSPITAL_COMMUNITY)
Admission: AD | Admit: 2021-07-02 | Discharge: 2021-07-02 | Disposition: A | Discharge status: Home / Self Care | Payer: Medicaid Other | Attending: Obstetrics & Gynecology | Admitting: Obstetrics & Gynecology

## 2021-07-02 ENCOUNTER — Encounter (HOSPITAL_COMMUNITY): Payer: Self-pay | Admitting: Obstetrics & Gynecology

## 2021-07-02 ENCOUNTER — Other Ambulatory Visit: Payer: Self-pay | Admitting: Student

## 2021-07-02 DIAGNOSIS — D509 Iron deficiency anemia, unspecified: Secondary | ICD-10-CM | POA: Insufficient documentation

## 2021-07-02 DIAGNOSIS — Z7982 Long term (current) use of aspirin: Secondary | ICD-10-CM | POA: Diagnosis not present

## 2021-07-02 DIAGNOSIS — O10919 Unspecified pre-existing hypertension complicating pregnancy, unspecified trimester: Secondary | ICD-10-CM | POA: Diagnosis not present

## 2021-07-02 DIAGNOSIS — O99013 Anemia complicating pregnancy, third trimester: Secondary | ICD-10-CM

## 2021-07-02 DIAGNOSIS — Z3A30 30 weeks gestation of pregnancy: Secondary | ICD-10-CM

## 2021-07-02 DIAGNOSIS — O2243 Hemorrhoids in pregnancy, third trimester: Secondary | ICD-10-CM | POA: Diagnosis not present

## 2021-07-02 DIAGNOSIS — J45909 Unspecified asthma, uncomplicated: Secondary | ICD-10-CM | POA: Insufficient documentation

## 2021-07-02 DIAGNOSIS — O10913 Unspecified pre-existing hypertension complicating pregnancy, third trimester: Secondary | ICD-10-CM | POA: Insufficient documentation

## 2021-07-02 DIAGNOSIS — O99019 Anemia complicating pregnancy, unspecified trimester: Secondary | ICD-10-CM | POA: Insufficient documentation

## 2021-07-02 DIAGNOSIS — M6200 Separation of muscle (nontraumatic), unspecified site: Secondary | ICD-10-CM | POA: Insufficient documentation

## 2021-07-02 DIAGNOSIS — D649 Anemia, unspecified: Secondary | ICD-10-CM | POA: Diagnosis not present

## 2021-07-02 DIAGNOSIS — M76899 Other specified enthesopathies of unspecified lower limb, excluding foot: Secondary | ICD-10-CM | POA: Insufficient documentation

## 2021-07-02 LAB — CBC
HCT: 29.6 % — ABNORMAL LOW (ref 36.0–46.0)
Hemoglobin: 9.7 g/dL — ABNORMAL LOW (ref 12.0–15.0)
MCH: 28.3 pg (ref 26.0–34.0)
MCHC: 32.8 g/dL (ref 30.0–36.0)
MCV: 86.3 fL (ref 80.0–100.0)
Platelets: 266 10*3/uL (ref 150–400)
RBC: 3.43 MIL/uL — ABNORMAL LOW (ref 3.87–5.11)
RDW: 13.7 % (ref 11.5–15.5)
WBC: 9.8 10*3/uL (ref 4.0–10.5)
nRBC: 0 % (ref 0.0–0.2)

## 2021-07-02 LAB — URINALYSIS, ROUTINE W REFLEX MICROSCOPIC
Bacteria, UA: NONE SEEN
Bilirubin Urine: NEGATIVE
Glucose, UA: NEGATIVE mg/dL
Hgb urine dipstick: NEGATIVE
Ketones, ur: NEGATIVE mg/dL
Leukocytes,Ua: NEGATIVE
Nitrite: NEGATIVE
Protein, ur: 30 mg/dL — AB
Specific Gravity, Urine: 1.028 (ref 1.005–1.030)
pH: 6 (ref 5.0–8.0)

## 2021-07-02 LAB — COMPREHENSIVE METABOLIC PANEL
ALT: 15 U/L (ref 0–44)
AST: 17 U/L (ref 15–41)
Albumin: 2.6 g/dL — ABNORMAL LOW (ref 3.5–5.0)
Alkaline Phosphatase: 71 U/L (ref 38–126)
Anion gap: 6 (ref 5–15)
BUN: 8 mg/dL (ref 6–20)
CO2: 20 mmol/L — ABNORMAL LOW (ref 22–32)
Calcium: 8.6 mg/dL — ABNORMAL LOW (ref 8.9–10.3)
Chloride: 111 mmol/L (ref 98–111)
Creatinine, Ser: 0.56 mg/dL (ref 0.44–1.00)
GFR, Estimated: >60 mL/min (ref >60)
Glucose, Bld: 108 mg/dL — ABNORMAL HIGH (ref 70–99)
Potassium: 4.1 mmol/L (ref 3.5–5.1)
Sodium: 137 mmol/L (ref 135–145)
Total Bilirubin: 0.5 mg/dL (ref 0.3–1.2)
Total Protein: 6.2 g/dL — ABNORMAL LOW (ref 6.5–8.1)

## 2021-07-02 LAB — PROTEIN / CREATININE RATIO, URINE
Creatinine, Urine: 294.38 mg/dL
Protein Creatinine Ratio: 0.1 mg/mg{Cre} (ref 0.00–0.15)
Total Protein, Urine: 30 mg/dL

## 2021-07-02 MED ORDER — LIDOCAINE-HYDROCORTISONE ACE 2.8-0.55 % RE GEL: 1.0000 "application " | Freq: Two times a day (BID) | RECTAL | 0 refills | Status: DC

## 2021-07-02 MED ORDER — FAMOTIDINE 20 MG PO TABS: 20.0000 mg | ORAL_TABLET | Freq: Every day | ORAL | 0 refills | Status: DC

## 2021-07-02 MED ORDER — LABETALOL HCL 100 MG PO TABS
400.0000 mg | ORAL_TABLET | Freq: Once | ORAL | Status: AC
  Administered 2021-07-02: 400 mg via ORAL
  Filled 2021-07-02: qty 4

## 2021-07-02 MED ORDER — LIDOCAINE HCL URETHRAL/MUCOSAL 2 % EX GEL
1.0000 "application " | Freq: Once | CUTANEOUS | Status: AC
  Administered 2021-07-02: 1 via TOPICAL
  Filled 2021-07-02: qty 6

## 2021-07-02 NOTE — MAU Note (Signed)
Michele Gardner is a 36 y.o. at [redacted]w[redacted]d here in MAU reporting: has a hemorrhoid that's painful and bleeding with wiping, reports notices clots coming from hemorrhoid as well.  States is anemic & has elevated BP, felt dizzy yesterday and thought she may pass out.  Pt didn't pass out.  Onset of complaint: yesterday Pain score: 9/10 Vitals:   07/02/21 1329  BP: (!) 146/90  Pulse: 99  Resp: 19  Temp: 98.2 F (36.8 C)  SpO2: 99%     FHT:140 bpm Lab orders placed from triage:

## 2021-07-02 NOTE — MAU Provider Note (Signed)
History     [id number]  Arrival date and time: 07/02/21 1302    Chief Complaint  Patient presents with   Hemorrhoids   Hypertension   Anemia     HPI Michele Gardner is a 36 y.o. at 93w1dwho presents for hemorrhoids, anemia, & hypertension. Patient has had hemorrhoid for over a week. Reports increase in rectal pain & started bleeding this morning. Bright red blood on tissue when she wipes her bottom - definitely rectal, not vaginal. No bleeding into toilet & denies hematochezia. Denies issues with diarrhea or constipation. Had been treating hemorrhoids with preparation-H & tucks wipes with minimal relief.  Currently taking iron supplements for anemia. Reports episode of lightheadedness yesterday that she associates with her anemia. No LOC.  Chronic hypertension currently treated with labetalol 400 mg BID. Did not take morning dose today. Had headache yesterday that resolved with flexeril. Denies visual disturbance, or epigastric pain.  Good fetal movement.   OB History     Gravida  4   Para  2   Term  2   Preterm  0   AB  1   Living  2      SAB  0   IAB  1   Ectopic  0   Multiple  0   Live Births  2           Past Medical History:  Diagnosis Date   Asthma    Hypertension     Past Surgical History:  Procedure Laterality Date   WISDOM TOOTH EXTRACTION  2007    Family History  Problem Relation Age of Onset   Hypertension Mother    Hyperlipidemia Mother    Melanoma Father    Alcohol abuse Father    Cancer Maternal Grandfather    Diabetes Paternal Grandmother    Cancer Paternal Grandmother    Diabetes Paternal Grandfather    Brain cancer Other     Allergies  Allergen Reactions   Bee Venom Anaphylaxis    No current facility-administered medications on file prior to encounter.   Current Outpatient Medications on File Prior to Encounter  Medication Sig Dispense Refill   aspirin EC 81 MG tablet Take 1 tablet (81 mg total) by mouth daily.  Take after 12 weeks for prevention of preeclampsia later in pregnancy 300 tablet 2   Blood Pressure Monitoring (BLOOD PRESSURE KIT) DEVI 1 kit by Does not apply route once a week. 1 each 0   Butalbital-APAP-Caffeine 50-325-40 MG capsule Take 1-2 capsules by mouth every 6 (six) hours as needed for headache. 40 capsule 2   cyclobenzaprine (FLEXERIL) 10 MG tablet Take 1 tablet (10 mg total) by mouth 3 (three) times daily as needed for muscle spasms. 30 tablet 2   cyproheptadine (PERIACTIN) 4 MG tablet Take 1 tablet (4 mg total) by mouth at bedtime. 30 tablet 3   labetalol (NORMODYNE) 200 MG tablet Take 2 tablets (400 mg total) by mouth 2 (two) times daily. 120 tablet 3   POLY-IRON 150 FORTE 150-25-1 MG-MCG-MG CAPS Take 1 capsule by mouth every other day.     Prenatal Vit-Fe Fumarate-FA (PRENATAL MULTIVITAMIN) TABS tablet Take 1 tablet by mouth daily at 12 noon.       ROS Pertinent positives and negative per HPI, all others reviewed and negative  Physical Exam   BP 129/80   Pulse 95   Temp 98.2 F (36.8 C) (Oral)   Resp 19   Ht 5' 6"  (1.676 m)  Wt 102.2 kg   LMP 12/03/2020 (Approximate)   SpO2 99%   BMI 36.38 kg/m   Patient Vitals for the past 24 hrs:  BP Temp Temp src Pulse Resp SpO2 Height Weight  07/02/21 1546 129/80 -- -- 95 -- -- -- --  07/02/21 1531 130/89 -- -- 87 -- -- -- --  07/02/21 1516 130/87 -- -- 100 -- -- -- --  07/02/21 1501 (!) 142/96 -- -- 97 -- -- -- --  07/02/21 1448 (!) 142/87 -- -- 94 -- -- -- --  07/02/21 1425 -- -- -- -- -- 99 % -- --  07/02/21 1420 -- -- -- -- -- 99 % -- --  07/02/21 1415 -- -- -- -- -- 99 % -- --  07/02/21 1410 -- -- -- -- -- 99 % -- --  07/02/21 1405 -- -- -- -- -- 99 % -- --  07/02/21 1400 -- -- -- -- -- 99 % -- --  07/02/21 1355 -- -- -- -- -- 99 % -- --  07/02/21 1353 (!) 154/99 -- -- (!) 104 -- -- -- --  07/02/21 1329 (!) 146/90 98.2 F (36.8 C) Oral 99 19 99 % -- --  07/02/21 1321 -- -- -- -- -- -- 5' 6"  (1.676 m) 102.2 kg     Physical Exam Vitals and nursing note reviewed. Exam conducted with a chaperone present.  Constitutional:      General: She is not in acute distress.    Appearance: Normal appearance.  HENT:     Head: Normocephalic and atraumatic.  Eyes:     General: No scleral icterus.    Conjunctiva/sclera: Conjunctivae normal.  Pulmonary:     Effort: Pulmonary effort is normal. No respiratory distress.  Genitourinary:    Comments: Prolapsed internal hemorrhoid, ~1 cm, thrombosed Neurological:     Mental Status: She is alert.      FHT Baseline 140, moderate variability, 15x15 accels, no decels Toco: none Cat: 1  Labs Results for orders placed or performed during the hospital encounter of 07/02/21 (from the past 24 hour(s))  Urinalysis, Routine w reflex microscopic Urine, Clean Catch     Status: Abnormal   Collection Time: 07/02/21  1:32 PM  Result Value Ref Range   Color, Urine AMBER (A) YELLOW   APPearance CLOUDY (A) CLEAR   Specific Gravity, Urine 1.028 1.005 - 1.030   pH 6.0 5.0 - 8.0   Glucose, UA NEGATIVE NEGATIVE mg/dL   Hgb urine dipstick NEGATIVE NEGATIVE   Bilirubin Urine NEGATIVE NEGATIVE   Ketones, ur NEGATIVE NEGATIVE mg/dL   Protein, ur 30 (A) NEGATIVE mg/dL   Nitrite NEGATIVE NEGATIVE   Leukocytes,Ua NEGATIVE NEGATIVE   RBC / HPF 0-5 0 - 5 RBC/hpf   WBC, UA 11-20 0 - 5 WBC/hpf   Bacteria, UA NONE SEEN NONE SEEN   Squamous Epithelial / LPF 11-20 0 - 5   Mucus PRESENT   Protein / creatinine ratio, urine     Status: None   Collection Time: 07/02/21  1:32 PM  Result Value Ref Range   Creatinine, Urine 294.38 mg/dL   Total Protein, Urine 30 mg/dL   Protein Creatinine Ratio 0.10 0.00 - 0.15 mg/mg[Cre]  CBC     Status: Abnormal   Collection Time: 07/02/21  2:30 PM  Result Value Ref Range   WBC 9.8 4.0 - 10.5 K/uL   RBC 3.43 (L) 3.87 - 5.11 MIL/uL   Hemoglobin 9.7 (L) 12.0 - 15.0 g/dL   HCT 29.6 (  L) 36.0 - 46.0 %   MCV 86.3 80.0 - 100.0 fL   MCH 28.3 26.0 -  34.0 pg   MCHC 32.8 30.0 - 36.0 g/dL   RDW 13.7 11.5 - 15.5 %   Platelets 266 150 - 400 K/uL   nRBC 0.0 0.0 - 0.2 %  Comprehensive metabolic panel     Status: Abnormal   Collection Time: 07/02/21  2:30 PM  Result Value Ref Range   Sodium 137 135 - 145 mmol/L   Potassium 4.1 3.5 - 5.1 mmol/L   Chloride 111 98 - 111 mmol/L   CO2 20 (L) 22 - 32 mmol/L   Glucose, Bld 108 (H) 70 - 99 mg/dL   BUN 8 6 - 20 mg/dL   Creatinine, Ser 0.56 0.44 - 1.00 mg/dL   Calcium 8.6 (L) 8.9 - 10.3 mg/dL   Total Protein 6.2 (L) 6.5 - 8.1 g/dL   Albumin 2.6 (L) 3.5 - 5.0 g/dL   AST 17 15 - 41 U/L   ALT 15 0 - 44 U/L   Alkaline Phosphatase 71 38 - 126 U/L   Total Bilirubin 0.5 0.3 - 1.2 mg/dL   GFR, Estimated >60 >60 mL/min   Anion gap 6 5 - 15    Imaging No results found.  MAU Course  Procedures Lab Orders         Urinalysis, Routine w reflex microscopic Urine, Clean Catch         CBC         Comprehensive metabolic panel         Protein / creatinine ratio, urine     Meds ordered this encounter  Medications   labetalol (NORMODYNE) tablet 400 mg   lidocaine (XYLOCAINE) 2 % jelly 1 application.   Lidocaine-Hydrocortisone Ace 2.8-0.55 % GEL    Sig: Place 1 application. rectally 2 (two) times daily.    Dispense:  100 g    Refill:  0    Order Specific Question:   Supervising Provider    Answer:   Verita Schneiders A [[id number]]   famotidine (PEPCID) 20 MG tablet    Sig: Take 1 tablet (20 mg total) by mouth daily.    Dispense:  30 tablet    Refill:  0    Order Specific Question:   Supervising Provider    Answer:   Verita Schneiders A [[id number]]   Imaging Orders  No imaging studies ordered today    MDM Hypertension - Asymptomatic & preeclampsia labs normal. Missed morning labetalol dose. BPs improved in MAU after giving oral labetalol.   Anemia - Asymptomatic in MAU. Continue oral iron. Will facilitate outpatient Venofer infusion.   Hemorrhoid - Given lidocaine in MAU. Will send topic cream for home  use.  Assessment and Plan   1. Chronic hypertension affecting pregnancy  -Continue labetalol as prescribed -Reviewed s/s of preeclampsia  2. Hemorrhoids during pregnancy in third trimester  -Rx lidocaine/hydrocortisone rectal cream -Avoid constipation.  -Sitz baths -F/u with GI if symptoms worsen or don't improve  3. Anemia affecting pregnancy in third trimester  -Continue oral iron -Will start process to schedule IV iron infusion  4. [redacted] weeks gestation of pregnancy      Jorje Guild, NP 07/02/21 4:08 PM

## 2021-07-05 ENCOUNTER — Encounter: Payer: Self-pay | Admitting: Obstetrics

## 2021-07-05 ENCOUNTER — Ambulatory Visit (INDEPENDENT_AMBULATORY_CARE_PROVIDER_SITE_OTHER): Payer: Medicaid Other | Admitting: Obstetrics

## 2021-07-05 VITALS — BP 116/77 | HR 90 | Wt 225.0 lb

## 2021-07-05 DIAGNOSIS — O0990 Supervision of high risk pregnancy, unspecified, unspecified trimester: Secondary | ICD-10-CM

## 2021-07-05 DIAGNOSIS — O10919 Unspecified pre-existing hypertension complicating pregnancy, unspecified trimester: Secondary | ICD-10-CM

## 2021-07-05 DIAGNOSIS — O99013 Anemia complicating pregnancy, third trimester: Secondary | ICD-10-CM

## 2021-07-05 DIAGNOSIS — O99213 Obesity complicating pregnancy, third trimester: Secondary | ICD-10-CM

## 2021-07-05 NOTE — Progress Notes (Signed)
Subjective:  Michele Gardner is a 36 y.o. [id number] at [redacted]w[redacted]d being seen today for ongoing prenatal care.  She is currently monitored for the following issues for this high-risk pregnancy and has Benign essential hypertension; Gastroesophageal reflux disease; Myalgia; Chronic hypertension affecting pregnancy; Supervision of high risk pregnancy, antepartum; Abnormal TSH; Known fetal anomaly - echogenic bowel; Migraine without aura and without status migrainosus, not intractable; Pregnancy headache in second trimester; Enthesopathy of hip region; Diastasis of muscle; Asthma; and Anemia complicating pregnancy in third trimester on their problem list.  Patient reports no complaints.  Contractions: Irregular. Vag. Bleeding: None.  Movement: Present. Denies leaking of fluid.   The following portions of the patient's history were reviewed and updated as appropriate: allergies, current medications, past family history, past medical history, past social history, past surgical history and problem list. Problem list updated.  Objective:   Vitals:   07/05/21 1555  BP: 116/77  Pulse: 90  Weight: 225 lb (102.1 kg)    Fetal Status:     Movement: Present     General:  Alert, oriented and cooperative. Patient is in no acute distress.  Skin: Skin is warm and dry. No rash noted.   Cardiovascular: Normal heart rate noted  Respiratory: Normal respiratory effort, no problems with respiration noted  Abdomen: Soft, gravid, appropriate for gestational age. Pain/Pressure: Present     Pelvic:  Cervical exam deferred        Extremities: Normal range of motion.     Mental Status: Normal mood and affect. Normal behavior. Normal judgment and thought content.   Urinalysis:      Assessment and Plan:  Pregnancy: [id number] at [redacted]w[redacted]d  1. Supervision of high risk pregnancy, antepartum  2. Chronic hypertension affecting pregnancy - BP well controlled on Labetalol  3. Anemia complicating pregnancy in third trimester -  scheduled for iron infusion next week  4. Obesity affecting pregnancy in third trimester - taking Baby ASA    There are no diagnoses linked to this encounter. Preterm labor symptoms and general obstetric precautions including but not limited to vaginal bleeding, contractions, leaking of fluid and fetal movement were reviewed in detail with the patient. Please refer to After Visit Summary for other counseling recommendations.   Return in about 2 weeks (around 07/19/2021) for [facility].   Shelly Bombard, MD  07/05/21

## 2021-07-05 NOTE — Progress Notes (Signed)
Pt states that she has a lot of fatigue and tiredness this pregnancy.

## 2021-07-06 ENCOUNTER — Other Ambulatory Visit: Payer: Self-pay | Admitting: *Deleted

## 2021-07-06 ENCOUNTER — Encounter: Payer: Medicaid Other | Admitting: Obstetrics and Gynecology

## 2021-07-06 MED ORDER — LIDOCAINE-HYDROCORTISONE ACE 2.8-0.55 % RE GEL: 1.0000 "application " | Freq: Two times a day (BID) | RECTAL | 0 refills | Status: DC

## 2021-07-06 NOTE — Progress Notes (Signed)
Pt states RX for Lidocaine-Hydrocortisone not available at [facility] ordered an alternate medication but it is not covered by her insurance. Original RX sent to [facility] instead per pt request and Dr. Verdell Carmine approval. Pt reporting cont'd bleeding from hemorrhoid. Reinforced previous suggestions by [facility] provider for constipation prevention, using RX, Sitz baths.

## 2021-07-07 ENCOUNTER — Telehealth: Payer: Self-pay | Admitting: Emergency Medicine

## 2021-07-07 NOTE — Telephone Encounter (Signed)
error 

## 2021-07-07 NOTE — Telephone Encounter (Signed)
Pt calls to clinic stating she lost her mucus plug at around 7am this morning while using the restroom. Reports + fetal movement and braxton hicks contractions x1 week. Denies LOF. Pt reports she is having contractions that feel "pretty strong like the real thing." Pt recommended to present to hospital immediately for evaluation of PTL.

## 2021-07-08 ENCOUNTER — Ambulatory Visit: Payer: Medicaid Other | Admitting: *Deleted

## 2021-07-08 ENCOUNTER — Ambulatory Visit: Payer: Medicaid Other | Attending: Obstetrics and Gynecology

## 2021-07-08 ENCOUNTER — Other Ambulatory Visit: Payer: Self-pay | Admitting: *Deleted

## 2021-07-08 VITALS — BP 133/89 | HR 87

## 2021-07-08 DIAGNOSIS — O99013 Anemia complicating pregnancy, third trimester: Secondary | ICD-10-CM | POA: Diagnosis present

## 2021-07-08 DIAGNOSIS — O359XX0 Maternal care for (suspected) fetal abnormality and damage, unspecified, not applicable or unspecified: Secondary | ICD-10-CM | POA: Insufficient documentation

## 2021-07-08 DIAGNOSIS — O0990 Supervision of high risk pregnancy, unspecified, unspecified trimester: Secondary | ICD-10-CM | POA: Insufficient documentation

## 2021-07-08 DIAGNOSIS — O283 Abnormal ultrasonic finding on antenatal screening of mother: Secondary | ICD-10-CM | POA: Insufficient documentation

## 2021-07-08 DIAGNOSIS — O35EXX0 Maternal care for other (suspected) fetal abnormality and damage, fetal genitourinary anomalies, not applicable or unspecified: Secondary | ICD-10-CM | POA: Diagnosis not present

## 2021-07-08 DIAGNOSIS — O10912 Unspecified pre-existing hypertension complicating pregnancy, second trimester: Secondary | ICD-10-CM | POA: Insufficient documentation

## 2021-07-08 DIAGNOSIS — O403XX0 Polyhydramnios, third trimester, not applicable or unspecified: Secondary | ICD-10-CM | POA: Diagnosis not present

## 2021-07-08 DIAGNOSIS — Z3A31 31 weeks gestation of pregnancy: Secondary | ICD-10-CM

## 2021-07-08 DIAGNOSIS — O10013 Pre-existing essential hypertension complicating pregnancy, third trimester: Secondary | ICD-10-CM | POA: Diagnosis not present

## 2021-07-08 DIAGNOSIS — O10913 Unspecified pre-existing hypertension complicating pregnancy, third trimester: Secondary | ICD-10-CM

## 2021-07-13 ENCOUNTER — Inpatient Hospital Stay (HOSPITAL_COMMUNITY): Admission: RE | Admit: 2021-07-13 | Payer: Medicaid Other | Source: Ambulatory Visit

## 2021-07-14 ENCOUNTER — Ambulatory Visit: Payer: Medicaid Other | Attending: Obstetrics and Gynecology

## 2021-07-14 ENCOUNTER — Ambulatory Visit: Payer: Medicaid Other | Admitting: *Deleted

## 2021-07-14 VITALS — BP 141/93 | HR 85

## 2021-07-14 DIAGNOSIS — O358XX0 Maternal care for other (suspected) fetal abnormality and damage, not applicable or unspecified: Secondary | ICD-10-CM | POA: Diagnosis not present

## 2021-07-14 DIAGNOSIS — O402XX0 Polyhydramnios, second trimester, not applicable or unspecified: Secondary | ICD-10-CM | POA: Diagnosis not present

## 2021-07-14 DIAGNOSIS — O359XX0 Maternal care for (suspected) fetal abnormality and damage, unspecified, not applicable or unspecified: Secondary | ICD-10-CM | POA: Insufficient documentation

## 2021-07-14 DIAGNOSIS — O10013 Pre-existing essential hypertension complicating pregnancy, third trimester: Secondary | ICD-10-CM

## 2021-07-14 DIAGNOSIS — Z3A32 32 weeks gestation of pregnancy: Secondary | ICD-10-CM | POA: Diagnosis not present

## 2021-07-14 DIAGNOSIS — O10912 Unspecified pre-existing hypertension complicating pregnancy, second trimester: Secondary | ICD-10-CM | POA: Diagnosis present

## 2021-07-14 DIAGNOSIS — O283 Abnormal ultrasonic finding on antenatal screening of mother: Secondary | ICD-10-CM | POA: Diagnosis not present

## 2021-07-14 DIAGNOSIS — O0990 Supervision of high risk pregnancy, unspecified, unspecified trimester: Secondary | ICD-10-CM

## 2021-07-14 DIAGNOSIS — O99013 Anemia complicating pregnancy, third trimester: Secondary | ICD-10-CM | POA: Insufficient documentation

## 2021-07-15 ENCOUNTER — Non-Acute Institutional Stay (HOSPITAL_COMMUNITY)
Admission: RE | Admit: 2021-07-15 | Discharge: 2021-07-15 | Disposition: A | Discharge status: Home / Self Care | Payer: Medicaid Other | Source: Ambulatory Visit | Attending: Internal Medicine | Admitting: Internal Medicine

## 2021-07-15 DIAGNOSIS — O99013 Anemia complicating pregnancy, third trimester: Secondary | ICD-10-CM | POA: Diagnosis present

## 2021-07-15 MED ORDER — DIPHENHYDRAMINE HCL 50 MG/ML IJ SOLN: 25.0000 mg | Freq: Once | INTRAMUSCULAR | Status: DC | PRN

## 2021-07-15 MED ORDER — SODIUM CHLORIDE 0.9 % IV SOLN
300.0000 mg | INTRAVENOUS | Status: DC
  Administered 2021-07-15: 300 mg via INTRAVENOUS
  Filled 2021-07-15: qty 15

## 2021-07-15 MED ORDER — SODIUM CHLORIDE 0.9 % IV SOLN: INTRAVENOUS | Status: DC | PRN

## 2021-07-15 NOTE — Progress Notes (Signed)
PATIENT CARE CENTER NOTE   Diagnosis: Anemia complicating pregnancy in third trimester   Provider: Jorje Guild, NP   Procedure: Venofer 300 mg infusion    Note:  Patient received Venofer 300 mg infusion (dose # 1 of 3) via PIV. No pre-medications ordered. Patient tolerated infusion well with no adverse reaction. Vital signs stable. Discharge instructions given. Patient scheduled to come back on 07/25/21 for second infusion. Alert, oriented and ambulatory at discharge.

## 2021-07-19 ENCOUNTER — Encounter: Payer: Self-pay | Admitting: *Deleted

## 2021-07-20 ENCOUNTER — Encounter: Payer: Self-pay | Admitting: Obstetrics & Gynecology

## 2021-07-20 ENCOUNTER — Telehealth (INDEPENDENT_AMBULATORY_CARE_PROVIDER_SITE_OTHER): Payer: Medicaid Other | Admitting: Obstetrics & Gynecology

## 2021-07-20 DIAGNOSIS — O0990 Supervision of high risk pregnancy, unspecified, unspecified trimester: Secondary | ICD-10-CM

## 2021-07-20 DIAGNOSIS — Z3A32 32 weeks gestation of pregnancy: Secondary | ICD-10-CM

## 2021-07-20 DIAGNOSIS — O99013 Anemia complicating pregnancy, third trimester: Secondary | ICD-10-CM

## 2021-07-20 DIAGNOSIS — O0993 Supervision of high risk pregnancy, unspecified, third trimester: Secondary | ICD-10-CM

## 2021-07-20 DIAGNOSIS — O10919 Unspecified pre-existing hypertension complicating pregnancy, unspecified trimester: Secondary | ICD-10-CM

## 2021-07-20 DIAGNOSIS — O10913 Unspecified pre-existing hypertension complicating pregnancy, third trimester: Secondary | ICD-10-CM

## 2021-07-20 DIAGNOSIS — O359XX0 Maternal care for (suspected) fetal abnormality and damage, unspecified, not applicable or unspecified: Secondary | ICD-10-CM

## 2021-07-20 NOTE — Progress Notes (Signed)
I connected with  Michele Gardner on 07/20/21 by a video enabled telemedicine application and verified that I am speaking with the correct person using two identifiers.   I discussed the limitations of evaluation and management by telemedicine. The patient expressed understanding and agreed to proceed.   MyChart OB,  Pt is unable to check her vitals at this time.

## 2021-07-20 NOTE — Progress Notes (Signed)
I connected with Michele Gardner 07/20/21 at  9:35 AM EDT by: MyChart video and verified that I am speaking with the correct person using two identifiers.  Patient is located at home and provider is located at [place].     I discussed the limitations, risks, security and privacy concerns of performing an evaluation and management service by MyChart video and the availability of in person appointments. I also discussed with the patient that there may be a patient responsible charge related to this service. By engaging in this virtual visit, you consent to the provision of healthcare.  Additionally, you authorize for your insurance to be billed for the services provided during this visit.  The patient expressed understanding and agreed to proceed.  The following staff members participated in the virtual visit:  Woodroe Mode, MD     PRENATAL VISIT NOTE  Subjective:  Michele Gardner is a 36 y.o. [id number] at [redacted]w[redacted]d  for virtual visit for ongoing prenatal care.  She is currently monitored for the following issues for this high-risk pregnancy and has Benign essential hypertension; Gastroesophageal reflux disease; Myalgia; Chronic hypertension affecting pregnancy; Supervision of high risk pregnancy, antepartum; Abnormal TSH; Known fetal anomaly - echogenic bowel; Migraine without aura and without status migrainosus, not intractable; Pregnancy headache in second trimester; Enthesopathy of hip region; Diastasis of muscle; Asthma; and Anemia complicating pregnancy in third trimester on their problem list.  Patient reports no complaints.  Contractions: Irritability. Vag. Bleeding: None.  Movement: Present. Denies leaking of fluid.   The following portions of the patient's history were reviewed and updated as appropriate: allergies, current medications, past family history, past medical history, past social history, past surgical history and problem list.   Objective:  There were no vitals filed for this visit.  Self-Obtained  Fetal Status:     Movement: Present     Assessment and Plan:  Pregnancy: [id number] at [redacted]w[redacted]d 1. Known fetal anomaly, antepartum, single or unspecified fetus F/u US, has normal growth  2. Supervision of high risk pregnancy, antepartum CHTN, weekly BPP  3. Anemia complicating pregnancy in third trimester CBC    Component Value Date/Time   WBC 9.8 07/02/2021 1430   RBC 3.43 (L) 07/02/2021 1430   HGB 9.7 (L) 07/02/2021 1430   HGB 11.1 06/08/2021 1049   HCT 29.6 (L) 07/02/2021 1430   HCT 33.7 (L) 06/08/2021 1049   PLT 266 07/02/2021 1430   PLT 304 06/08/2021 1049   MCV 86.3 07/02/2021 1430   MCV 87 06/08/2021 1049   MCH 28.3 07/02/2021 1430   MCHC 32.8 07/02/2021 1430   RDW 13.7 07/02/2021 1430   RDW 12.8 06/08/2021 1049   LYMPHSABS 1.0 02/17/2021 1316   MONOABS 0.7 06/11/2014 0029   EOSABS 0.2 02/17/2021 1316   BASOSABS 0.1 02/17/2021 1316     4. Chronic hypertension affecting pregnancy Labetalol  Preterm labor symptoms and general obstetric precautions including but not limited to vaginal bleeding, contractions, leaking of fluid and fetal movement were reviewed in detail with the patient.  Return in about 2 weeks (around 08/03/2021).  Future Appointments  Date Time Provider [facility]  07/20/2021  9:35 AM Woodroe Mode, MD [facility] None  07/21/2021  9:15 AM [facility] NURSE [facility]  07/21/2021  9:45 AM [facility] US4 [facility]  07/25/2021 10:00 AM [facility] None  07/29/2021  8:45 AM Teague Bobbye Morton, PA-C [facility]  07/29/2021  1:30 PM [facility] NURSE [facility]  07/29/2021  1:45 PM  [facility] US6 [facility]  08/01/2021  8:00 AM [facility] None  08/03/2021  9:35 AM Chancy Milroy, MD [facility] None  08/04/2021  9:30 AM [facility] NURSE [facility]  08/04/2021  9:45 AM [facility] US6 [facility]  08/12/2021  9:30 AM [facility] NURSE [facility]  08/12/2021  9:45 AM [facility] US6 [facility] [place]     Time spent on virtual visit: 11  minutes  Emeterio Reeve, MD Patient ID: Michele Gardner, female   DOB: 03-27-1985, 36 y.o.   MRN: [id number]

## 2021-07-21 ENCOUNTER — Ambulatory Visit: Payer: Medicaid Other | Attending: Obstetrics and Gynecology

## 2021-07-21 ENCOUNTER — Ambulatory Visit: Payer: Medicaid Other | Admitting: *Deleted

## 2021-07-21 ENCOUNTER — Encounter: Payer: Self-pay | Admitting: *Deleted

## 2021-07-21 VITALS — BP 144/93 | HR 93

## 2021-07-21 DIAGNOSIS — O402XX0 Polyhydramnios, second trimester, not applicable or unspecified: Secondary | ICD-10-CM | POA: Diagnosis not present

## 2021-07-21 DIAGNOSIS — O359XX0 Maternal care for (suspected) fetal abnormality and damage, unspecified, not applicable or unspecified: Secondary | ICD-10-CM | POA: Insufficient documentation

## 2021-07-21 DIAGNOSIS — O10013 Pre-existing essential hypertension complicating pregnancy, third trimester: Secondary | ICD-10-CM

## 2021-07-21 DIAGNOSIS — Z3A33 33 weeks gestation of pregnancy: Secondary | ICD-10-CM

## 2021-07-21 DIAGNOSIS — O283 Abnormal ultrasonic finding on antenatal screening of mother: Secondary | ICD-10-CM | POA: Diagnosis present

## 2021-07-21 DIAGNOSIS — O99013 Anemia complicating pregnancy, third trimester: Secondary | ICD-10-CM

## 2021-07-21 DIAGNOSIS — O0990 Supervision of high risk pregnancy, unspecified, unspecified trimester: Secondary | ICD-10-CM

## 2021-07-21 DIAGNOSIS — O10912 Unspecified pre-existing hypertension complicating pregnancy, second trimester: Secondary | ICD-10-CM | POA: Insufficient documentation

## 2021-07-21 DIAGNOSIS — D649 Anemia, unspecified: Secondary | ICD-10-CM

## 2021-07-21 DIAGNOSIS — O358XX0 Maternal care for other (suspected) fetal abnormality and damage, not applicable or unspecified: Secondary | ICD-10-CM

## 2021-07-22 ENCOUNTER — Encounter (HOSPITAL_COMMUNITY): Payer: Medicaid Other

## 2021-07-25 ENCOUNTER — Non-Acute Institutional Stay (HOSPITAL_COMMUNITY)
Admission: RE | Admit: 2021-07-25 | Discharge: 2021-07-25 | Disposition: A | Discharge status: Home / Self Care | Payer: Medicaid Other | Source: Ambulatory Visit | Attending: Internal Medicine | Admitting: Internal Medicine

## 2021-07-25 DIAGNOSIS — O99013 Anemia complicating pregnancy, third trimester: Secondary | ICD-10-CM | POA: Diagnosis not present

## 2021-07-25 DIAGNOSIS — D649 Anemia, unspecified: Secondary | ICD-10-CM | POA: Diagnosis not present

## 2021-07-25 DIAGNOSIS — Z3A00 Weeks of gestation of pregnancy not specified: Secondary | ICD-10-CM | POA: Insufficient documentation

## 2021-07-25 MED ORDER — SODIUM CHLORIDE 0.9 % IV SOLN
300.0000 mg | Freq: Once | INTRAVENOUS | Status: AC
  Administered 2021-07-25: 300 mg via INTRAVENOUS
  Filled 2021-07-25: qty 15

## 2021-07-25 MED ORDER — SODIUM CHLORIDE 0.9 % IV SOLN
INTRAVENOUS | Status: DC | PRN
Start: 2021-07-25 — End: 2021-07-26

## 2021-07-25 MED ORDER — DIPHENHYDRAMINE HCL 50 MG/ML IJ SOLN: 25.0000 mg | INTRAMUSCULAR | Status: DC | PRN

## 2021-07-25 NOTE — Progress Notes (Signed)
PATIENT CARE CENTER NOTE     Diagnosis: Anemia complicating pregnancy in third trimester     Provider: Judeth Horn, NP     Procedure: Venofer 300 mg infusion      Note:  Patient received Venofer 300 mg infusion (dose # 2 of 3) via PIV. No pre-medications ordered. Patient tolerated infusion well with no adverse reaction. BP elevated pre (144/96) and post (144/95) infusion. Patient is taking BP medications and is being monitored weekly by OBGYN for elevated BP.  AVS offered but patient refused. Patient scheduled to come back on 08/01/21 for final infusion. Alert, oriented and ambulatory at discharge.

## 2021-07-29 ENCOUNTER — Encounter: Payer: Medicaid Other | Admitting: Physician Assistant

## 2021-07-29 ENCOUNTER — Encounter: Payer: Self-pay | Admitting: *Deleted

## 2021-07-29 ENCOUNTER — Ambulatory Visit: Payer: Medicaid Other | Admitting: *Deleted

## 2021-07-29 ENCOUNTER — Ambulatory Visit: Payer: Medicaid Other | Attending: Obstetrics

## 2021-07-29 ENCOUNTER — Other Ambulatory Visit: Payer: Self-pay | Admitting: *Deleted

## 2021-07-29 VITALS — BP 128/83 | HR 91

## 2021-07-29 DIAGNOSIS — D649 Anemia, unspecified: Secondary | ICD-10-CM

## 2021-07-29 DIAGNOSIS — O359XX0 Maternal care for (suspected) fetal abnormality and damage, unspecified, not applicable or unspecified: Secondary | ICD-10-CM | POA: Insufficient documentation

## 2021-07-29 DIAGNOSIS — Z3A34 34 weeks gestation of pregnancy: Secondary | ICD-10-CM | POA: Diagnosis not present

## 2021-07-29 DIAGNOSIS — O0990 Supervision of high risk pregnancy, unspecified, unspecified trimester: Secondary | ICD-10-CM

## 2021-07-29 DIAGNOSIS — O99013 Anemia complicating pregnancy, third trimester: Secondary | ICD-10-CM

## 2021-07-29 DIAGNOSIS — O403XX0 Polyhydramnios, third trimester, not applicable or unspecified: Secondary | ICD-10-CM | POA: Diagnosis not present

## 2021-07-29 DIAGNOSIS — O10913 Unspecified pre-existing hypertension complicating pregnancy, third trimester: Secondary | ICD-10-CM | POA: Insufficient documentation

## 2021-07-29 DIAGNOSIS — O358XX0 Maternal care for other (suspected) fetal abnormality and damage, not applicable or unspecified: Secondary | ICD-10-CM | POA: Diagnosis not present

## 2021-07-29 DIAGNOSIS — O10013 Pre-existing essential hypertension complicating pregnancy, third trimester: Secondary | ICD-10-CM | POA: Diagnosis not present

## 2021-07-29 DIAGNOSIS — O10919 Unspecified pre-existing hypertension complicating pregnancy, unspecified trimester: Secondary | ICD-10-CM

## 2021-08-01 ENCOUNTER — Encounter (HOSPITAL_COMMUNITY): Payer: Medicaid Other

## 2021-08-01 ENCOUNTER — Non-Acute Institutional Stay (HOSPITAL_COMMUNITY)
Admission: RE | Admit: 2021-08-01 | Discharge: 2021-08-01 | Disposition: A | Discharge status: Home / Self Care | Payer: Medicaid Other | Source: Ambulatory Visit | Attending: Internal Medicine | Admitting: Internal Medicine

## 2021-08-01 DIAGNOSIS — O99013 Anemia complicating pregnancy, third trimester: Secondary | ICD-10-CM | POA: Diagnosis present

## 2021-08-01 MED ORDER — SODIUM CHLORIDE 0.9 % IV SOLN
300.0000 mg | Freq: Once | INTRAVENOUS | Status: AC
  Administered 2021-08-01: 300 mg via INTRAVENOUS
  Filled 2021-08-01: qty 15

## 2021-08-01 MED ORDER — DIPHENHYDRAMINE HCL 50 MG/ML IJ SOLN
25.0000 mg | Freq: Once | INTRAMUSCULAR | Status: DC | PRN
Start: 2021-08-01 — End: 2021-08-02

## 2021-08-01 MED ORDER — SODIUM CHLORIDE 0.9 % IV SOLN: INTRAVENOUS | Status: DC | PRN

## 2021-08-01 NOTE — Progress Notes (Addendum)
PATIENT CARE CENTER NOTE:  Provider: Judeth Horn NP  Diagnosis: anemia complicating pregnancy in third trimester  Procedure: Venofer 300mg    Patient received IV Venofer ( dose 3 of 3). No premeds required per orders, PRN benadryl not needed during infusion. Pre infusion BP 141/90. Tolerated well, vitals stable. Post infusion BP 143/93, pt is having BP monitored by OBGYN weekly, provider aware of issue. Pt declined to stay for 1 hour observation after infusion completed. Pt declined AVS. Patient alert, oriented, and ambulatory at the time of discharge.

## 2021-08-03 ENCOUNTER — Encounter: Payer: Self-pay | Admitting: Obstetrics and Gynecology

## 2021-08-03 ENCOUNTER — Telehealth (INDEPENDENT_AMBULATORY_CARE_PROVIDER_SITE_OTHER): Payer: Medicaid Other | Admitting: Obstetrics and Gynecology

## 2021-08-03 DIAGNOSIS — O99013 Anemia complicating pregnancy, third trimester: Secondary | ICD-10-CM

## 2021-08-03 DIAGNOSIS — Z3A34 34 weeks gestation of pregnancy: Secondary | ICD-10-CM

## 2021-08-03 DIAGNOSIS — O0993 Supervision of high risk pregnancy, unspecified, third trimester: Secondary | ICD-10-CM

## 2021-08-03 DIAGNOSIS — O0990 Supervision of high risk pregnancy, unspecified, unspecified trimester: Secondary | ICD-10-CM

## 2021-08-03 DIAGNOSIS — O10919 Unspecified pre-existing hypertension complicating pregnancy, unspecified trimester: Secondary | ICD-10-CM

## 2021-08-03 DIAGNOSIS — O10913 Unspecified pre-existing hypertension complicating pregnancy, third trimester: Secondary | ICD-10-CM

## 2021-08-03 NOTE — Progress Notes (Signed)
OBSTETRICS PRENATAL VIRTUAL VISIT ENCOUNTER NOTE  Provider location: [facility] [place] [facility]   Patient location: Home  I connected with Michele Gardner on 08/03/21 at  9:35 AM EDT by MyChart Video Encounter and verified that I am speaking with the correct person using two identifiers. I discussed the limitations, risks, security and privacy concerns of performing an evaluation and management service virtually and the availability of in person appointments. I also discussed with the patient that there may be a patient responsible charge related to this service. The patient expressed understanding and agreed to proceed. Subjective:  MY BONGIOVANNI is a 36 y.o. [phone] at [redacted]w[redacted]d being seen today for ongoing prenatal care.  She is currently monitored for the following issues for this high-risk pregnancy and has Benign essential hypertension; Gastroesophageal reflux disease; Myalgia; Chronic hypertension affecting pregnancy; Supervision of high risk pregnancy, antepartum; Abnormal TSH; Known fetal anomaly - echogenic bowel; Migraine without aura and without status migrainosus, not intractable; Pregnancy headache in second trimester; Enthesopathy of hip region; Diastasis of muscle; Asthma; and Anemia complicating pregnancy in third trimester on their problem list.  Patient reports general discomforts of pregnancy.  Contractions: Not present. Vag. Bleeding: None.  Movement: Present. Denies any leaking of fluid.   The following portions of the patient's history were reviewed and updated as appropriate: allergies, current medications, past family history, past medical history, past social history, past surgical history and problem list.   Objective:  There were no vitals filed for this visit.  Fetal Status:     Movement: Present     General:  Alert, oriented and cooperative. Patient is in no acute distress.  Respiratory: Normal respiratory effort, no problems with respiration noted   Mental Status: Normal mood and affect. Normal behavior. Normal judgment and thought content.  Rest of physical exam deferred due to type of encounter  Imaging: [place] MFM FETAL BPP WO NON STRESS  Result Date: 07/29/2021 ----------------------------------------------------------------------  OBSTETRICS REPORT                       (Signed Final 07/29/2021 02:39 pm) ---------------------------------------------------------------------- Patient Info  ID #:       [id number]                          D.O.B.:  1985-11-28 (35 yrs)  Name:       Michele Gardner                  Visit Date: 07/29/2021 02:16 pm ---------------------------------------------------------------------- Performed By  Attending:        Johnell Comings MD         Referred By:      Tama High  Performed By:     Benson Norway          [facility] ---------------------------------------------------------------------- Orders  #  Description                           Code        Ordered By  1  [place] MFM FETAL BPP WO NON               [id number]    YU FANG     STRESS ----------------------------------------------------------------------  #  Order #                     Accession #                Episode #  1  [id number]                   [id number]                 [id number] ---------------------------------------------------------------------- Indications  Hypertension - Chronic/Pre-existing            O10.019  (labetalol)  Polyhydramnios, third trimester, fetus         O40.2XX0  unspecified  Echogenic bowel                                O35.8XX0 028.3  LR NIPS, Neg Horizon, Neg AFP  Anemia during pregnancy in third trimester     O99.013  [redacted] weeks gestation of pregnancy                Z3A.34  ---------------------------------------------------------------------- Fetal Evaluation  Num Of Fetuses:         1  Fetal Heart Rate(bpm):  133  Cardiac Activity:       Observed  Presentation:           Cephalic  Placenta:               Anterior  P. Cord Insertion:      Visualized, central  Amniotic Fluid  AFI FV:      Within normal limits  AFI Sum(cm)     %Tile       Largest Pocket(cm)  26.68           96          7.91  RUQ(cm)       RLQ(cm)       LUQ(cm)        LLQ(cm)  5.24          6.91          6.62           7.91 ---------------------------------------------------------------------- Biophysical Evaluation  Amniotic F.V:   Pocket => 2 cm             F. Tone:        Observed  F. Movement:    Observed                   Score:          8/8  F. Breathing:   Observed ---------------------------------------------------------------------- OB History  Gravidity:    4         Term:   2  TOP:          1        Living:  2 ---------------------------------------------------------------------- Gestational Age  LMP:           46w 1d        Date:  09/09/20                  EDD:   06/16/21  Best:          34w 5d     Det. By:  U/S C R L  (01/24/21)    EDD:   09/04/21 ---------------------------------------------------------------------- Comments  This patient was seen for a BPP due to chronic hypertension  treated with labetalol.  Echogenic bowel and polyhydramnios  were noted on her prior ultrasound exams.  She denies any problems since her last exam and has  screened negative for gestational diabetes.  A BPP performed today was 8 out of 8.  Polyhydramnios with a total AFI of 26.68 cm continues to be  noted.  She will return in 1 week for another BPP and growth scan. ----------------------------------------------------------------------                   Johnell Comings, MD Electronically Signed Final Report   07/29/2021 02:39 pm ----------------------------------------------------------------------  [place] MFM FETAL BPP WO NON  STRESS  Result Date: 07/21/2021 ----------------------------------------------------------------------  OBSTETRICS REPORT                       (Signed Final 07/21/2021 10:35 am) ---------------------------------------------------------------------- Patient Info  ID #:       [id number]                          D.O.B.:  13-Dec-1985 (35 yrs)  Name:       Michele Gardner                  Visit Date: 07/21/2021 09:38 am ---------------------------------------------------------------------- Performed By  Attending:        Tama High MD        Referred By:      Tama High  Performed By:     Margaretann Loveless     [facility]                    RD[facility]                                     Fetal Care at                                                             [place] [facility]                                                             [facility] ---------------------------------------------------------------------- Orders  #  Description                           Code        Ordered By  1  [place] MFM FETAL BPP WO NON               [id number]    RAVI [facility]     STRESS ----------------------------------------------------------------------  #  Order #                     Accession #                Episode #  1  [id number]                   [id number]                 [id number] ---------------------------------------------------------------------- Indications  Hypertension - Chronic/Pre-existing            O10.019  (labetalol)  Polyhydramnios, third trimester, fetus         O40.2XX0  unspecified  Echogenic bowel                                O35.8XX0 028.3  LR NIPS, Neg Horizon, Neg AFP  Anemia during pregnancy in third trimester     O99.013  [redacted] weeks gestation of pregnancy                Z3A.33 ---------------------------------------------------------------------- Vital Signs  BP:          138/91 ---------------------------------------------------------------------- Fetal Evaluation  Num Of Fetuses:         1  Fetal  Heart Rate(bpm):  146  Cardiac Activity:       Observed  Presentation:           Cephalic  Placenta:               Anterior  P. Cord Insertion:      Previously Visualized  Amniotic Fluid  AFI FV:      Polyhydramnios  AFI Sum(cm)     %Tile       Largest Pocket(cm)  28.57           > 97        9.78  RUQ(cm)       RLQ(cm)       LUQ(cm)        LLQ(cm)  9.78          4.81          5.88           8.1 ---------------------------------------------------------------------- Biophysical Evaluation  Amniotic F.V:   Pocket => 2 cm             F. Tone:        Observed  F. Movement:    Observed                   Score:          8/8  F. Breathing:   Observed ---------------------------------------------------------------------- Biometry  LV:        7.9  mm ---------------------------------------------------------------------- OB History  Gravidity:    4         Term:   2  TOP:          1        Living:  2 ---------------------------------------------------------------------- Gestational  Age  LMP:           45w 0d        Date:  09/09/20                  EDD:   06/16/21  Best:          33w 4d     Det. By:  U/S C R L  (01/24/21)    EDD:   09/04/21 ---------------------------------------------------------------------- Anatomy  Cranium:               Appears normal         LVOT:                   Appears normal  Cavum:                 Appears normal         Aortic Arch:            Previously seen  Ventricles:            Appears normal         Ductal Arch:            Previously seen  Choroid Plexus:        Previously seen        Diaphragm:              Appears normal  Cerebellum:            Previously seen        Stomach:                Appears normal, left                                                                        sided  Posterior Fossa:       Previously seen        Abdomen:                Previously seen  Nuchal Fold:           Not applicable (Q000111Q    Abdominal Wall:         Previously seen                         wks GA)  Face:                   Orbits and profile     Cord Vessels:           Previously seen                         previously seen  Lips:                  Previously seen        Kidneys:                Appear normal  Palate:                Not well visualized    Bladder:  Appears normal  Thoracic:              Appears normal         Spine:                  Previously seen  Heart:                 Appears normal         Upper Extremities:      Previously seen                         (4CH, axis, and                         situs)  RVOT:                  Appears normal         Lower Extremities:      Previously seen  Other:  Female gender previously seen. VC, 3VV and 3VTV previously          visualized. Technically difficult due to advanced GA and fetal position. ---------------------------------------------------------------------- Cervix Uterus Adnexa  Cervix  Not visualized (advanced GA >24wks)  Uterus  Normal shape and size.  Right Ovary  Not visualized.  Left Ovary  Not visualized. ---------------------------------------------------------------------- Impression  Chronic hypertension.  Patient takes labetalol 400 mg twice  daily.  Blood pressure today at our office are 138/91 and  144/93 mmHg.  Patient does not have symptoms and signs of  severe features of preeclampsia.  Patient has blood pressure cuff at home.  Mild polyhydramnios is seen (AFI 29 cm).  Good fetal activity  is present.  Antenatal testing is reassuring.  BPP 8/8  I encouraged the patient to check her blood pressures at  home and discussed the parameters of severe hypertension.  I discussed polyhydramnios that can lead to preterm labor.  The absence of gestational diabetes, it is usually idiopathic  (no known cause) and is associated with good fetal  outcomes.  In some cases polyhydramnios can be associated  with fetal anomalies that may be evident only after birth.  We discussed timing of delivery.  Blood pressures are well  controlled, it is  reasonable to consider delivery at 38 or [redacted]  weeks gestation. ---------------------------------------------------------------------- Recommendations  -Continue weekly BPP till delivery. ----------------------------------------------------------------------                 Tama High, MD Electronically Signed Final Report   07/21/2021 10:35 am ----------------------------------------------------------------------  [place] MFM FETAL BPP WO NON STRESS  Result Date: 07/14/2021 ----------------------------------------------------------------------  OBSTETRICS REPORT                       (Signed Final 07/14/2021 09:40 am) ---------------------------------------------------------------------- Patient Info  ID #:       [id number]                          D.O.B.:  [birth year] (35 yrs)  Name:       Michele Gardner                  Visit Date: 07/14/2021 08:37 am ---------------------------------------------------------------------- Performed By  Attending:        Tama High MD        Referred By:      Tama High  Performed By:  Rolm Bookbinder RDMS     [facility]                                                             Fetal [facility] at                                                             [place] for                                                             Women ---------------------------------------------------------------------- Orders  #  Description                           Code        Ordered By  1  [place] MFM FETAL BPP WO NON               [id number]    [facility]VI [facility]     STRESS ----------------------------------------------------------------------  #  Order #                     Accession #                Episode #  1  [id number]                   [id number]                 [id number] ---------------------------------------------------------------------- Indications  [redacted] weeks gestation of pregnancy                Z3A.32  Hypertension - Chronic/Pre-existing            O10.019  (labetalol)   Polyhydramnios, third trimester, fetus         O40.2XX0  unspecified  Echogenic bowel                                O35.8XX0 028.3  LR NIPS, Neg Horizon, Neg AFP ---------------------------------------------------------------------- Vital Signs  BP:          141/69 ---------------------------------------------------------------------- Fetal Evaluation  Num Of Fetuses:         1  Fetal Heart Rate(bpm):  136  Cardiac Activity:       Observed  Presentation:           Breech  Placenta:               Anterior  P. Cord Insertion:      Visualized  Amniotic Fluid  AFI FV:      Polyhydramnios  AFI Sum(cm)     %Tile       Largest Pocket(cm)  25.36           96  8.4  RUQ(cm)       RLQ(cm)       LUQ(cm)        LLQ(cm)  6.88          8.4           1.82           8.26 ---------------------------------------------------------------------- Biophysical Evaluation  Amniotic F.V:   Within normal limits       F. Tone:        Observed  F. Movement:    Observed                   Score:          8/8  F. Breathing:   Observed ---------------------------------------------------------------------- OB History  Gravidity:    4         Term:   2  TOP:          1        Living:  2 ---------------------------------------------------------------------- Gestational Age  LMP:           44w 0d        Date:  09/09/20                  EDD:   06/16/21  Best:          Armida Sans 4d     Det. By:  U/S C R L  (01/24/21)    EDD:   09/04/21 ---------------------------------------------------------------------- Anatomy  Cranium:               Appears normal         LVOT:                   Previously seen  Cavum:                 Appears normal         Aortic Arch:            Previously seen  Ventricles:            Appears normal         Ductal Arch:            Previously seen  Choroid Plexus:        Previously seen        Diaphragm:              Appears normal  Cerebellum:            Previously seen        Stomach:                Appears normal, left                                                                         sided  Posterior Fossa:       Previously seen        Abdomen:                Appears normal  Nuchal Fold:           Not applicable (>20    Abdominal Wall:         Previously seen  wks GA)  Face:                  Orbits and profile     Cord Vessels:           Previously seen                         previously seen  Lips:                  Previously seen        Kidneys:                Appear normal  Palate:                Not well visualized    Bladder:                Appears normal  Thoracic:              Appears normal         Spine:                  Previously seen  Heart:                 Appears normal         Upper Extremities:      Previously seen                         (4CH, axis, and                         situs)  RVOT:                  Previously seen        Lower Extremities:      Previously seen  Other:  Female gender previously seen. VC, 3VV and 3VTV previously          visualized. Technically difficult due to advanced GA and fetal position. ---------------------------------------------------------------------- Impression  Chronic hypertension.  Patient takes labetalol 400 mg twice  daily.  Blood pressure today at her office is 141/96 mmHg.  She reports intermittent headaches.  Patient has blood pressure cuff at home.  Mild polyhydramnios is seen.  Good fetal activity is present.  Antenatal testing is reassuring.  BPP 8/8  I encouraged the patient to check her blood pressures at  home and discussed the parameters of severe hypertension. ---------------------------------------------------------------------- Recommendations  -Continue weekly BPP till delivery. ----------------------------------------------------------------------                 Tama High, MD Electronically Signed Final Report   07/14/2021 09:40 am ----------------------------------------------------------------------  [place] MFM OB FOLLOW UP  Result Date:  07/08/2021 ----------------------------------------------------------------------  OBSTETRICS REPORT                       (Signed Final 07/08/2021 12:37 pm) ---------------------------------------------------------------------- Patient Info  ID #:       [id number]                          D.O.B.:  [birth year] (35 yrs)  Name:       JAKYRA OFFNER                  Visit Date: 07/08/2021 12:18 pm ---------------------------------------------------------------------- Performed By  Attending:  Ma RingsVictor Fang MD         Referred By:      Noralee SpaceAVI SHANKAR  Performed By:     Anabel Halonori Hutchens          [facility]                    RDMS                                     [facility] at                                                             [facility] ---------------------------------------------------------------------- Orders  #  Description                           Code        Ordered By  1  US MFM OB FOLLOW UP                   [id number]    [facility] ----------------------------------------------------------------------  #  Order #                     Accession #                Episode #  1  [id number]                   [id number]                 [id number] ---------------------------------------------------------------------- Indications  Hypertension - Chronic/Pre-existing            O10.019  (labetalol)  Polyhydramnios, third trimester, fetus         O40.2XX0  unspecified  [redacted] weeks gestation of pregnancy                Z3A.31  Echogenic bowel                                O35.8XX0 028.3  LR NIPS, Neg Horizon, Neg AFP ---------------------------------------------------------------------- Fetal Evaluation  Num Of Fetuses:         1  Fetal Heart Rate(bpm):  159  Cardiac Activity:       Observed  Presentation:           Breech  Amniotic Fluid  AFI FV:      Polyhydramnios  AFI Sum(cm)     %Tile       Largest Pocket(cm)  27.2             > 97        7.7  RUQ(cm)       RLQ(cm)  LUQ(cm)        LLQ(cm)  5.8           7.1           6.6            7.7 ---------------------------------------------------------------------- Biometry  BPD:      86.8  mm     G. Age:  35w 0d       > 99  %    CI:        76.98   %    70 - 86                                                          FL/HC:      18.3   %    19.1 - 21.3  HC:      313.3  mm     G. Age:  35w 1d         93  %    HC/AC:      1.05        0.96 - 1.17  AC:      298.2  mm     G. Age:  33w 6d         94  %    FL/BPD:     66.1   %    71 - 87  FL:       57.4  mm     G. Age:  30w 1d          6  %    FL/AC:      19.2   %    20 - 24  LV:        8.2  mm  Est. FW:    2091  gm    4 lb 10 oz      79  % ---------------------------------------------------------------------- OB History  Gravidity:    4         Term:   2  TOP:          1        Living:  2 ---------------------------------------------------------------------- Gestational Age  LMP:           43w 1d        Date:  09/09/20                 EDD:   06/16/21  U/S Today:     33w 4d                                        EDD:   08/22/21  Best:          31w 5d     Det. By:  U/S C R L  (01/24/21)    EDD:   09/04/21 ---------------------------------------------------------------------- Anatomy  Cranium:               Appears normal         LVOT:                   Previously seen  Cavum:                 Appears normal  Aortic Arch:            Previously seen  Ventricles:            Appears normal         Ductal Arch:            Previously seen  Choroid Plexus:        Previously seen        Diaphragm:              Appears normal  Cerebellum:            Previously seen        Stomach:                Appears normal, left                                                                        sided  Posterior Fossa:       Previously seen        Abdomen:                Appears normal  Nuchal Fold:           Not applicable (Q000111Q    Abdominal Wall:          Previously seen                         wks GA)  Face:                  Orbits and profile     Cord Vessels:           Previously seen                         previously seen  Lips:                  Previously seen        Kidneys:                Appear normal  Palate:                Not well visualized    Bladder:                Appears normal  Thoracic:              Appears normal         Spine:                  Previously seen  Heart:                 Appears normal         Upper Extremities:      Previously seen                         (4CH, axis, and                         situs)  RVOT:  Previously seen        Lower Extremities:      Previously seen  Other:  Female gender previously seen. VC, 3VV and 3VTV previously          visualized. Technically difficult due to advanced GA and fetal position. ---------------------------------------------------------------------- Cervix Uterus Adnexa  Cervix  Not visualized (advanced GA >24wks)  Uterus  No abnormality visualized.  Right Ovary  Not visualized.  Left Ovary  Not visualized.  Cul De Sac  No free fluid seen.  Adnexa  No abnormality visualized. ---------------------------------------------------------------------- Comments  This patient was seen for a follow up growth scan due to  chronic hypertension treated with labetalol.  Echogenic bowel  and polyhydramnios were noted on her prior ultrasound  exams.  She denies any problems since her last exam and  has screened negative for gestational diabetes.  She was informed that the fetal growth appears appropriate  for her gestational age.  Mild polyhydramnios with a total AFI  of 27.2 cm continues to be noted today.  The fetal bowel did not appear echogenic today.  Due to chronic hypertension treated with labetalol, we will  start weekly fetal testing at 32 weeks.  A BPP was scheduled in 1 week. ----------------------------------------------------------------------                   Johnell Comings, MD  Electronically Signed Final Report   07/08/2021 12:37 pm ----------------------------------------------------------------------   Assessment and Plan:  Pregnancy: [id number] at [redacted]w[redacted]d 1. Supervision of high risk pregnancy, antepartum Stable GBS and vaginal cultures at next OB appt  2. Chronic hypertension affecting pregnancy BP stable Continue with current management Serial growth scans and antenatal testing as per protocol   3. Anemia complicating pregnancy in third trimester S/P iron infusion  Preterm labor symptoms and general obstetric precautions including but not limited to vaginal bleeding, contractions, leaking of fluid and fetal movement were reviewed in detail with the patient. I discussed the assessment and treatment plan with the patient. The patient was provided an opportunity to ask questions and all were answered. The patient agreed with the plan and demonstrated an understanding of the instructions. The patient was advised to call back or seek an in-person office evaluation/go to MAU at [facility] for any urgent or concerning symptoms. Please refer to After Visit Summary for other counseling recommendations.   I provided 8 minutes of face-to-face time during this encounter.  Return in about 2 weeks (around 08/17/2021) for OB visit, face to face, MD only.  Future Appointments  Date Time Provider [place]  08/04/2021  9:30 AM [facility] NURSE [facility]  08/04/2021  9:45 AM [facility] US6 [facility]  08/12/2021  9:30 AM [facility] NURSE [facility]  08/12/2021  9:45 AM [facility] US6 [facility]  08/18/2021 10:30 AM [facility] NURSE [facility]  08/18/2021 10:45 AM [facility] US6 [facility] [place]    Chancy Milroy, MD Center for [employer], [place]

## 2021-08-03 NOTE — Patient Instructions (Signed)

## 2021-08-04 ENCOUNTER — Encounter: Payer: Self-pay | Admitting: *Deleted

## 2021-08-04 ENCOUNTER — Ambulatory Visit: Payer: Medicaid Other | Admitting: *Deleted

## 2021-08-04 ENCOUNTER — Ambulatory Visit: Payer: Medicaid Other | Attending: Obstetrics

## 2021-08-04 VITALS — BP 134/85 | HR 96

## 2021-08-04 DIAGNOSIS — O10913 Unspecified pre-existing hypertension complicating pregnancy, third trimester: Secondary | ICD-10-CM

## 2021-08-04 DIAGNOSIS — O3660X0 Maternal care for excessive fetal growth, unspecified trimester, not applicable or unspecified: Secondary | ICD-10-CM | POA: Diagnosis not present

## 2021-08-04 DIAGNOSIS — O0990 Supervision of high risk pregnancy, unspecified, unspecified trimester: Secondary | ICD-10-CM | POA: Diagnosis present

## 2021-08-04 DIAGNOSIS — O403XX0 Polyhydramnios, third trimester, not applicable or unspecified: Secondary | ICD-10-CM | POA: Diagnosis not present

## 2021-08-04 DIAGNOSIS — O359XX0 Maternal care for (suspected) fetal abnormality and damage, unspecified, not applicable or unspecified: Secondary | ICD-10-CM | POA: Diagnosis present

## 2021-08-04 DIAGNOSIS — O99013 Anemia complicating pregnancy, third trimester: Secondary | ICD-10-CM

## 2021-08-04 DIAGNOSIS — O10013 Pre-existing essential hypertension complicating pregnancy, third trimester: Secondary | ICD-10-CM

## 2021-08-04 DIAGNOSIS — O358XX0 Maternal care for other (suspected) fetal abnormality and damage, not applicable or unspecified: Secondary | ICD-10-CM

## 2021-08-04 DIAGNOSIS — D649 Anemia, unspecified: Secondary | ICD-10-CM | POA: Diagnosis not present

## 2021-08-04 DIAGNOSIS — Z3A35 35 weeks gestation of pregnancy: Secondary | ICD-10-CM | POA: Diagnosis not present

## 2021-08-05 ENCOUNTER — Ambulatory Visit (INDEPENDENT_AMBULATORY_CARE_PROVIDER_SITE_OTHER): Payer: Medicaid Other | Admitting: Physician Assistant

## 2021-08-05 ENCOUNTER — Encounter: Payer: Self-pay | Admitting: Physician Assistant

## 2021-08-05 VITALS — BP 145/97 | HR 97 | Wt 233.0 lb

## 2021-08-05 DIAGNOSIS — Z3A35 35 weeks gestation of pregnancy: Secondary | ICD-10-CM

## 2021-08-05 DIAGNOSIS — R519 Headache, unspecified: Secondary | ICD-10-CM | POA: Diagnosis not present

## 2021-08-05 DIAGNOSIS — O26893 Other specified pregnancy related conditions, third trimester: Secondary | ICD-10-CM

## 2021-08-05 DIAGNOSIS — G43901 Migraine, unspecified, not intractable, with status migrainosus: Secondary | ICD-10-CM | POA: Insufficient documentation

## 2021-08-05 DIAGNOSIS — G43801 Other migraine, not intractable, with status migrainosus: Secondary | ICD-10-CM

## 2021-08-07 ENCOUNTER — Encounter (HOSPITAL_COMMUNITY): Payer: Self-pay | Admitting: Obstetrics & Gynecology

## 2021-08-07 ENCOUNTER — Inpatient Hospital Stay (EMERGENCY_DEPARTMENT_HOSPITAL)
Admission: AD | Admit: 2021-08-07 | Discharge: 2021-08-07 | Disposition: A | Discharge status: Home / Self Care | Payer: Medicaid Other | Source: Home / Self Care | Attending: Obstetrics & Gynecology | Admitting: Obstetrics & Gynecology

## 2021-08-07 DIAGNOSIS — O26893 Other specified pregnancy related conditions, third trimester: Secondary | ICD-10-CM | POA: Insufficient documentation

## 2021-08-07 DIAGNOSIS — G43009 Migraine without aura, not intractable, without status migrainosus: Secondary | ICD-10-CM

## 2021-08-07 DIAGNOSIS — Z3A36 36 weeks gestation of pregnancy: Secondary | ICD-10-CM

## 2021-08-07 DIAGNOSIS — M7989 Other specified soft tissue disorders: Secondary | ICD-10-CM | POA: Insufficient documentation

## 2021-08-07 DIAGNOSIS — O10913 Unspecified pre-existing hypertension complicating pregnancy, third trimester: Secondary | ICD-10-CM | POA: Insufficient documentation

## 2021-08-07 DIAGNOSIS — O99353 Diseases of the nervous system complicating pregnancy, third trimester: Secondary | ICD-10-CM | POA: Insufficient documentation

## 2021-08-07 DIAGNOSIS — M546 Pain in thoracic spine: Secondary | ICD-10-CM | POA: Insufficient documentation

## 2021-08-07 DIAGNOSIS — O10919 Unspecified pre-existing hypertension complicating pregnancy, unspecified trimester: Secondary | ICD-10-CM

## 2021-08-07 LAB — URINALYSIS, ROUTINE W REFLEX MICROSCOPIC
Bilirubin Urine: NEGATIVE
Glucose, UA: NEGATIVE mg/dL
Hgb urine dipstick: NEGATIVE
Ketones, ur: NEGATIVE mg/dL
Nitrite: NEGATIVE
Protein, ur: NEGATIVE mg/dL
Specific Gravity, Urine: 1.017 (ref 1.005–1.030)
pH: 7 (ref 5.0–8.0)

## 2021-08-07 LAB — CBC
HCT: 34.5 % — ABNORMAL LOW (ref 36.0–46.0)
Hemoglobin: 11.5 g/dL — ABNORMAL LOW (ref 12.0–15.0)
MCH: 29 pg (ref 26.0–34.0)
MCHC: 33.3 g/dL (ref 30.0–36.0)
MCV: 87.1 fL (ref 80.0–100.0)
Platelets: 229 10*3/uL (ref 150–400)
RBC: 3.96 MIL/uL (ref 3.87–5.11)
RDW: 18.4 % — ABNORMAL HIGH (ref 11.5–15.5)
WBC: 9.3 10*3/uL (ref 4.0–10.5)
nRBC: 0.2 % (ref 0.0–0.2)

## 2021-08-07 LAB — COMPREHENSIVE METABOLIC PANEL
ALT: 17 U/L (ref 0–44)
AST: 19 U/L (ref 15–41)
Albumin: 2.8 g/dL — ABNORMAL LOW (ref 3.5–5.0)
Alkaline Phosphatase: 86 U/L (ref 38–126)
Anion gap: 16 — ABNORMAL HIGH (ref 5–15)
BUN: 6 mg/dL (ref 6–20)
CO2: 17 mmol/L — ABNORMAL LOW (ref 22–32)
Calcium: 8.7 mg/dL — ABNORMAL LOW (ref 8.9–10.3)
Chloride: 102 mmol/L (ref 98–111)
Creatinine, Ser: 0.52 mg/dL (ref 0.44–1.00)
GFR, Estimated: >60 mL/min (ref >60)
Glucose, Bld: 112 mg/dL — ABNORMAL HIGH (ref 70–99)
Potassium: 3.5 mmol/L (ref 3.5–5.1)
Sodium: 135 mmol/L (ref 135–145)
Total Bilirubin: 0.5 mg/dL (ref 0.3–1.2)
Total Protein: 6.6 g/dL (ref 6.5–8.1)

## 2021-08-07 LAB — PROTEIN / CREATININE RATIO, URINE
Creatinine, Urine: 105.88 mg/dL
Protein Creatinine Ratio: 0.15 mg/mg{Cre} (ref 0.00–0.15)
Total Protein, Urine: 16 mg/dL

## 2021-08-07 MED ORDER — DIPHENHYDRAMINE HCL 50 MG/ML IJ SOLN
25.0000 mg | Freq: Once | INTRAMUSCULAR | Status: AC
  Administered 2021-08-07: 25 mg via INTRAVENOUS
  Filled 2021-08-07: qty 1

## 2021-08-07 MED ORDER — HYDRALAZINE HCL 20 MG/ML IJ SOLN
5.0000 mg | INTRAMUSCULAR | Status: DC | PRN
  Filled 2021-08-07: qty 1

## 2021-08-07 MED ORDER — LACTATED RINGERS IV SOLN: INTRAVENOUS | Status: DC

## 2021-08-07 MED ORDER — LABETALOL HCL 200 MG PO TABS: 400.0000 mg | ORAL_TABLET | Freq: Three times a day (TID) | ORAL | 3 refills | Status: DC

## 2021-08-07 MED ORDER — METOCLOPRAMIDE HCL 5 MG/ML IJ SOLN
10.0000 mg | Freq: Once | INTRAMUSCULAR | Status: AC
  Administered 2021-08-07: 10 mg via INTRAVENOUS
  Filled 2021-08-07: qty 2

## 2021-08-07 MED ORDER — LABETALOL HCL 5 MG/ML IV SOLN: 20.0000 mg | INTRAVENOUS | Status: DC | PRN

## 2021-08-07 MED ORDER — LABETALOL HCL 5 MG/ML IV SOLN: 40.0000 mg | INTRAVENOUS | Status: DC | PRN

## 2021-08-07 MED ORDER — CAFFEINE 200 MG PO TABS
200.0000 mg | ORAL_TABLET | Freq: Once | ORAL | Status: AC
  Administered 2021-08-07: 200 mg via ORAL
  Filled 2021-08-07: qty 1

## 2021-08-07 MED ORDER — HYDRALAZINE HCL 20 MG/ML IJ SOLN: 10.0000 mg | INTRAMUSCULAR | Status: DC | PRN

## 2021-08-08 ENCOUNTER — Encounter: Payer: Self-pay | Admitting: Obstetrics and Gynecology

## 2021-08-08 ENCOUNTER — Ambulatory Visit (INDEPENDENT_AMBULATORY_CARE_PROVIDER_SITE_OTHER): Payer: Medicaid Other | Admitting: Obstetrics and Gynecology

## 2021-08-08 ENCOUNTER — Encounter (HOSPITAL_COMMUNITY): Payer: Self-pay | Admitting: Obstetrics & Gynecology

## 2021-08-08 ENCOUNTER — Inpatient Hospital Stay (HOSPITAL_COMMUNITY)
Admission: AD | Admit: 2021-08-08 | Discharge: 2021-08-11 | DRG: 806 | Disposition: A | Discharge status: Home / Self Care | Payer: Medicaid Other | Attending: Family Medicine | Admitting: Family Medicine

## 2021-08-08 ENCOUNTER — Other Ambulatory Visit: Payer: Self-pay

## 2021-08-08 VITALS — BP 142/107 | HR 98 | Wt 230.0 lb

## 2021-08-08 DIAGNOSIS — O1002 Pre-existing essential hypertension complicating childbirth: Secondary | ICD-10-CM | POA: Diagnosis not present

## 2021-08-08 DIAGNOSIS — O9902 Anemia complicating childbirth: Secondary | ICD-10-CM | POA: Diagnosis not present

## 2021-08-08 DIAGNOSIS — O1413 Severe pre-eclampsia, third trimester: Secondary | ICD-10-CM

## 2021-08-08 DIAGNOSIS — G43009 Migraine without aura, not intractable, without status migrainosus: Secondary | ICD-10-CM | POA: Diagnosis present

## 2021-08-08 DIAGNOSIS — O359XX0 Maternal care for (suspected) fetal abnormality and damage, unspecified, not applicable or unspecified: Secondary | ICD-10-CM | POA: Diagnosis present

## 2021-08-08 DIAGNOSIS — O10919 Unspecified pre-existing hypertension complicating pregnancy, unspecified trimester: Secondary | ICD-10-CM

## 2021-08-08 DIAGNOSIS — O99013 Anemia complicating pregnancy, third trimester: Secondary | ICD-10-CM

## 2021-08-08 DIAGNOSIS — O114 Pre-existing hypertension with pre-eclampsia, complicating childbirth: Principal | ICD-10-CM | POA: Diagnosis present

## 2021-08-08 DIAGNOSIS — O99019 Anemia complicating pregnancy, unspecified trimester: Secondary | ICD-10-CM | POA: Diagnosis present

## 2021-08-08 DIAGNOSIS — Z3A36 36 weeks gestation of pregnancy: Secondary | ICD-10-CM

## 2021-08-08 DIAGNOSIS — D509 Iron deficiency anemia, unspecified: Secondary | ICD-10-CM | POA: Diagnosis not present

## 2021-08-08 DIAGNOSIS — O1414 Severe pre-eclampsia complicating childbirth: Secondary | ICD-10-CM | POA: Diagnosis not present

## 2021-08-08 DIAGNOSIS — O0990 Supervision of high risk pregnancy, unspecified, unspecified trimester: Secondary | ICD-10-CM

## 2021-08-08 DIAGNOSIS — O99354 Diseases of the nervous system complicating childbirth: Secondary | ICD-10-CM | POA: Diagnosis not present

## 2021-08-08 DIAGNOSIS — R7989 Other specified abnormal findings of blood chemistry: Secondary | ICD-10-CM | POA: Diagnosis present

## 2021-08-08 DIAGNOSIS — O10913 Unspecified pre-existing hypertension complicating pregnancy, third trimester: Secondary | ICD-10-CM | POA: Diagnosis not present

## 2021-08-08 DIAGNOSIS — Z87891 Personal history of nicotine dependence: Secondary | ICD-10-CM | POA: Diagnosis not present

## 2021-08-08 DIAGNOSIS — O1410 Severe pre-eclampsia, unspecified trimester: Secondary | ICD-10-CM | POA: Diagnosis present

## 2021-08-08 DIAGNOSIS — O119 Pre-existing hypertension with pre-eclampsia, unspecified trimester: Secondary | ICD-10-CM

## 2021-08-08 LAB — PROTEIN / CREATININE RATIO, URINE
Creatinine, Urine: 193.36 mg/dL
Protein Creatinine Ratio: 0.1 mg/mg{Cre} (ref 0.00–0.15)
Total Protein, Urine: 20 mg/dL

## 2021-08-08 LAB — TYPE AND SCREEN
ABO/RH(D): O POS
Antibody Screen: NEGATIVE

## 2021-08-08 LAB — COMPREHENSIVE METABOLIC PANEL
ALT: 16 U/L (ref 0–44)
AST: 18 U/L (ref 15–41)
Albumin: 2.9 g/dL — ABNORMAL LOW (ref 3.5–5.0)
Alkaline Phosphatase: 77 U/L (ref 38–126)
Anion gap: 11 (ref 5–15)
BUN: <5 mg/dL — ABNORMAL LOW (ref 6–20)
CO2: 18 mmol/L — ABNORMAL LOW (ref 22–32)
Calcium: 8.8 mg/dL — ABNORMAL LOW (ref 8.9–10.3)
Chloride: 108 mmol/L (ref 98–111)
Creatinine, Ser: 0.59 mg/dL (ref 0.44–1.00)
GFR, Estimated: >60 mL/min (ref >60)
Glucose, Bld: 82 mg/dL (ref 70–99)
Potassium: 3.9 mmol/L (ref 3.5–5.1)
Sodium: 137 mmol/L (ref 135–145)
Total Bilirubin: 0.5 mg/dL (ref 0.3–1.2)
Total Protein: 6.5 g/dL (ref 6.5–8.1)

## 2021-08-08 LAB — CBC
HCT: 34.2 % — ABNORMAL LOW (ref 36.0–46.0)
Hemoglobin: 11.5 g/dL — ABNORMAL LOW (ref 12.0–15.0)
MCH: 28.8 pg (ref 26.0–34.0)
MCHC: 33.6 g/dL (ref 30.0–36.0)
MCV: 85.7 fL (ref 80.0–100.0)
Platelets: 243 10*3/uL (ref 150–400)
RBC: 3.99 MIL/uL (ref 3.87–5.11)
RDW: 18 % — ABNORMAL HIGH (ref 11.5–15.5)
WBC: 8.1 10*3/uL (ref 4.0–10.5)
nRBC: 0 % (ref 0.0–0.2)

## 2021-08-08 LAB — GC/CHLAMYDIA PROBE AMP (~~LOC~~) NOT AT ARMC
Chlamydia: NEGATIVE
Comment: NEGATIVE
Comment: NORMAL
Neisseria Gonorrhea: NEGATIVE

## 2021-08-08 MED ORDER — PENICILLIN G POT IN DEXTROSE 60000 UNIT/ML IV SOLN
3.0000 10*6.[IU] | INTRAVENOUS | Status: DC
  Administered 2021-08-08 – 2021-08-09 (×4): 3 10*6.[IU] via INTRAVENOUS
  Filled 2021-08-08 (×6): qty 50

## 2021-08-08 MED ORDER — OXYTOCIN-SODIUM CHLORIDE 30-0.9 UT/500ML-% IV SOLN
1.0000 m[IU]/min | INTRAVENOUS | Status: DC
  Administered 2021-08-08: 2 m[IU]/min via INTRAVENOUS
  Filled 2021-08-08: qty 500

## 2021-08-08 MED ORDER — LACTATED RINGERS IV SOLN: INTRAVENOUS | Status: DC

## 2021-08-08 MED ORDER — LABETALOL HCL 5 MG/ML IV SOLN: 20.0000 mg | INTRAVENOUS | Status: DC | PRN

## 2021-08-08 MED ORDER — MISOPROSTOL 50MCG HALF TABLET
50.0000 ug | ORAL_TABLET | ORAL | Status: DC | PRN
  Administered 2021-08-08: 50 ug via BUCCAL
  Filled 2021-08-08: qty 1

## 2021-08-08 MED ORDER — TERBUTALINE SULFATE 1 MG/ML IJ SOLN: 0.2500 mg | Freq: Once | INTRAMUSCULAR | Status: DC | PRN

## 2021-08-08 MED ORDER — FENTANYL CITRATE (PF) 100 MCG/2ML IJ SOLN
50.0000 ug | INTRAMUSCULAR | Status: DC | PRN
  Administered 2021-08-09 (×2): 100 ug via INTRAVENOUS
  Filled 2021-08-08 (×2): qty 2

## 2021-08-08 MED ORDER — MAGNESIUM SULFATE BOLUS VIA INFUSION
4.0000 g | Freq: Once | INTRAVENOUS | Status: AC
  Administered 2021-08-08: 4 g via INTRAVENOUS
  Filled 2021-08-08: qty 1000

## 2021-08-08 MED ORDER — OXYTOCIN BOLUS FROM INFUSION
333.0000 mL | Freq: Once | INTRAVENOUS | Status: AC
  Administered 2021-08-09: 333 mL via INTRAVENOUS

## 2021-08-08 MED ORDER — LABETALOL HCL 5 MG/ML IV SOLN: 40.0000 mg | INTRAVENOUS | Status: DC | PRN

## 2021-08-08 MED ORDER — LACTATED RINGERS IV SOLN: 500.0000 mL | INTRAVENOUS | Status: DC | PRN

## 2021-08-08 MED ORDER — HYDRALAZINE HCL 20 MG/ML IJ SOLN: 10.0000 mg | INTRAMUSCULAR | Status: DC | PRN

## 2021-08-08 MED ORDER — OXYCODONE-ACETAMINOPHEN 5-325 MG PO TABS: 1.0000 | ORAL_TABLET | ORAL | Status: DC | PRN

## 2021-08-08 MED ORDER — ACETAMINOPHEN 325 MG PO TABS: 650.0000 mg | ORAL_TABLET | ORAL | Status: DC | PRN

## 2021-08-08 MED ORDER — OXYTOCIN-SODIUM CHLORIDE 30-0.9 UT/500ML-% IV SOLN
2.5000 [IU]/h | INTRAVENOUS | Status: DC
  Administered 2021-08-09: 2.5 [IU]/h via INTRAVENOUS

## 2021-08-08 MED ORDER — LABETALOL HCL 200 MG PO TABS
400.0000 mg | ORAL_TABLET | Freq: Three times a day (TID) | ORAL | Status: DC
  Administered 2021-08-08 – 2021-08-09 (×3): 400 mg via ORAL
  Filled 2021-08-08 (×3): qty 2

## 2021-08-08 MED ORDER — SOD CITRATE-CITRIC ACID 500-334 MG/5ML PO SOLN
30.0000 mL | ORAL | Status: DC | PRN
  Administered 2021-08-09: 30 mL via ORAL
  Filled 2021-08-08: qty 30

## 2021-08-08 MED ORDER — PENICILLIN G POTASSIUM 5000000 UNITS IJ SOLR
5.0000 10*6.[IU] | Freq: Once | INTRAMUSCULAR | Status: AC
  Administered 2021-08-08: 5 10*6.[IU] via INTRAVENOUS
  Filled 2021-08-08: qty 5

## 2021-08-08 MED ORDER — MAGNESIUM SULFATE 40 GM/1000ML IV SOLN
2.0000 g/h | INTRAVENOUS | Status: DC
  Administered 2021-08-08 – 2021-08-09 (×2): 2 g/h via INTRAVENOUS
  Filled 2021-08-08 (×2): qty 1000

## 2021-08-08 MED ORDER — LIDOCAINE HCL (PF) 1 % IJ SOLN: 30.0000 mL | INTRAMUSCULAR | Status: DC | PRN

## 2021-08-08 MED ORDER — LABETALOL HCL 5 MG/ML IV SOLN: 80.0000 mg | INTRAVENOUS | Status: DC | PRN

## 2021-08-08 MED ORDER — OXYCODONE-ACETAMINOPHEN 5-325 MG PO TABS: 2.0000 | ORAL_TABLET | ORAL | Status: DC | PRN

## 2021-08-08 MED ORDER — ONDANSETRON HCL 4 MG/2ML IJ SOLN: 4.0000 mg | Freq: Four times a day (QID) | INTRAMUSCULAR | Status: DC | PRN

## 2021-08-08 NOTE — Progress Notes (Signed)
Pt presents for ROB reports HA's worsening since yesterday. GBS/GC/CT completed yesterday

## 2021-08-09 ENCOUNTER — Encounter (HOSPITAL_COMMUNITY): Payer: Self-pay | Admitting: Obstetrics & Gynecology

## 2021-08-09 ENCOUNTER — Inpatient Hospital Stay (HOSPITAL_COMMUNITY): Payer: Medicaid Other | Admitting: Anesthesiology

## 2021-08-09 DIAGNOSIS — Z3A36 36 weeks gestation of pregnancy: Secondary | ICD-10-CM | POA: Diagnosis not present

## 2021-08-09 DIAGNOSIS — O9902 Anemia complicating childbirth: Secondary | ICD-10-CM | POA: Diagnosis not present

## 2021-08-09 DIAGNOSIS — D649 Anemia, unspecified: Secondary | ICD-10-CM | POA: Diagnosis not present

## 2021-08-09 DIAGNOSIS — O1414 Severe pre-eclampsia complicating childbirth: Secondary | ICD-10-CM | POA: Diagnosis not present

## 2021-08-09 DIAGNOSIS — O164 Unspecified maternal hypertension, complicating childbirth: Secondary | ICD-10-CM | POA: Diagnosis not present

## 2021-08-09 LAB — COMPREHENSIVE METABOLIC PANEL
ALT: 15 U/L (ref 0–44)
ALT: 14 U/L (ref 0–44)
AST: 20 U/L (ref 15–41)
AST: 26 U/L (ref 15–41)
Albumin: 2.9 g/dL — ABNORMAL LOW (ref 3.5–5.0)
Albumin: 2.3 g/dL — ABNORMAL LOW (ref 3.5–5.0)
Alkaline Phosphatase: 83 U/L (ref 38–126)
Alkaline Phosphatase: 69 U/L (ref 38–126)
Anion gap: 10 (ref 5–15)
Anion gap: 12 (ref 5–15)
BUN: 6 mg/dL (ref 6–20)
BUN: 6 mg/dL (ref 6–20)
CO2: 17 mmol/L — ABNORMAL LOW (ref 22–32)
CO2: 22 mmol/L (ref 22–32)
Calcium: 7.6 mg/dL — ABNORMAL LOW (ref 8.9–10.3)
Calcium: 7.2 mg/dL — ABNORMAL LOW (ref 8.9–10.3)
Chloride: 106 mmol/L (ref 98–111)
Chloride: 101 mmol/L (ref 98–111)
Creatinine, Ser: 0.71 mg/dL (ref 0.44–1.00)
Creatinine, Ser: 0.82 mg/dL (ref 0.44–1.00)
GFR, Estimated: >60 mL/min (ref >60)
GFR, Estimated: >60 mL/min (ref >60)
Glucose, Bld: 114 mg/dL — ABNORMAL HIGH (ref 70–99)
Glucose, Bld: 107 mg/dL — ABNORMAL HIGH (ref 70–99)
Potassium: 3.6 mmol/L (ref 3.5–5.1)
Potassium: 3.6 mmol/L (ref 3.5–5.1)
Sodium: 133 mmol/L — ABNORMAL LOW (ref 135–145)
Sodium: 135 mmol/L (ref 135–145)
Total Bilirubin: 0.8 mg/dL (ref 0.3–1.2)
Total Bilirubin: 0.5 mg/dL (ref 0.3–1.2)
Total Protein: 6.6 g/dL (ref 6.5–8.1)
Total Protein: 5.4 g/dL — ABNORMAL LOW (ref 6.5–8.1)

## 2021-08-09 LAB — CBC
HCT: 35.6 % — ABNORMAL LOW (ref 36.0–46.0)
Hemoglobin: 11.6 g/dL — ABNORMAL LOW (ref 12.0–15.0)
MCH: 28.4 pg (ref 26.0–34.0)
MCHC: 32.6 g/dL (ref 30.0–36.0)
MCV: 87 fL (ref 80.0–100.0)
Platelets: 224 10*3/uL (ref 150–400)
RBC: 4.09 MIL/uL (ref 3.87–5.11)
RDW: 18.6 % — ABNORMAL HIGH (ref 11.5–15.5)
WBC: 14.8 10*3/uL — ABNORMAL HIGH (ref 4.0–10.5)
nRBC: 0 % (ref 0.0–0.2)

## 2021-08-09 LAB — MAGNESIUM
Magnesium: 5.4 mg/dL — ABNORMAL HIGH (ref 1.7–2.4)
Magnesium: 5.3 mg/dL — ABNORMAL HIGH (ref 1.7–2.4)

## 2021-08-09 LAB — CULTURE, BETA STREP (GROUP B ONLY)

## 2021-08-09 LAB — SYPHILIS: RPR W/REFLEX TO RPR TITER AND TREPONEMAL ANTIBODIES, TRADITIONAL SCREENING AND DIAGNOSIS ALGORITHM: RPR Ser Ql: NONREACTIVE

## 2021-08-09 MED ORDER — CYCLOBENZAPRINE HCL 10 MG PO TABS
10.0000 mg | ORAL_TABLET | Freq: Three times a day (TID) | ORAL | Status: DC | PRN
  Administered 2021-08-09: 10 mg via ORAL
  Filled 2021-08-09: qty 1

## 2021-08-09 MED ORDER — EPHEDRINE 5 MG/ML INJ
10.0000 mg | INTRAVENOUS | Status: DC | PRN
Start: 2021-08-09 — End: 2021-08-09

## 2021-08-09 MED ORDER — DIPHENHYDRAMINE HCL 50 MG/ML IJ SOLN: 12.5000 mg | INTRAMUSCULAR | Status: DC | PRN

## 2021-08-09 MED ORDER — ONDANSETRON HCL 4 MG/2ML IJ SOLN: 4.0000 mg | INTRAMUSCULAR | Status: DC | PRN

## 2021-08-09 MED ORDER — PRENATAL MULTIVITAMIN CH
1.0000 | ORAL_TABLET | Freq: Every day | ORAL | Status: DC
  Administered 2021-08-09 – 2021-08-11 (×3): 1 via ORAL
  Filled 2021-08-09 (×3): qty 1

## 2021-08-09 MED ORDER — PHENYLEPHRINE 80 MCG/ML (10ML) SYRINGE FOR IV PUSH (FOR BLOOD PRESSURE SUPPORT): 80.0000 ug | PREFILLED_SYRINGE | INTRAVENOUS | Status: DC | PRN

## 2021-08-09 MED ORDER — PHENYLEPHRINE 80 MCG/ML (10ML) SYRINGE FOR IV PUSH (FOR BLOOD PRESSURE SUPPORT)
80.0000 ug | PREFILLED_SYRINGE | INTRAVENOUS | Status: DC | PRN
  Filled 2021-08-09: qty 10

## 2021-08-09 MED ORDER — TRANEXAMIC ACID-NACL 1000-0.7 MG/100ML-% IV SOLN
INTRAVENOUS | Status: AC
  Filled 2021-08-09: qty 100

## 2021-08-09 MED ORDER — FUROSEMIDE 20 MG PO TABS
20.0000 mg | ORAL_TABLET | Freq: Every day | ORAL | Status: DC
  Administered 2021-08-10: 20 mg via ORAL
  Filled 2021-08-09: qty 1

## 2021-08-09 MED ORDER — ZOLPIDEM TARTRATE 5 MG PO TABS: 5.0000 mg | ORAL_TABLET | Freq: Every evening | ORAL | Status: DC | PRN

## 2021-08-09 MED ORDER — DIBUCAINE (PERIANAL) 1 % EX OINT
1.0000 | TOPICAL_OINTMENT | CUTANEOUS | Status: DC | PRN
  Administered 2021-08-10: 1 via RECTAL
  Filled 2021-08-09: qty 28

## 2021-08-09 MED ORDER — SENNOSIDES-DOCUSATE SODIUM 8.6-50 MG PO TABS
2.0000 | ORAL_TABLET | ORAL | Status: DC
  Administered 2021-08-10: 2 via ORAL
  Filled 2021-08-09: qty 2

## 2021-08-09 MED ORDER — TETANUS-DIPHTH-ACELL PERTUSSIS 5-2.5-18.5 LF-MCG/0.5 IM SUSY: 0.5000 mL | PREFILLED_SYRINGE | Freq: Once | INTRAMUSCULAR | Status: DC

## 2021-08-09 MED ORDER — IBUPROFEN 600 MG PO TABS
600.0000 mg | ORAL_TABLET | Freq: Four times a day (QID) | ORAL | Status: DC
  Administered 2021-08-10 – 2021-08-11 (×5): 600 mg via ORAL
  Filled 2021-08-09 (×6): qty 1

## 2021-08-09 MED ORDER — LACTATED RINGERS AMNIOINFUSION
INTRAVENOUS | Status: DC
  Filled 2021-08-09 (×2): qty 1000

## 2021-08-09 MED ORDER — SODIUM CHLORIDE 0.9% FLUSH: 3.0000 mL | INTRAVENOUS | Status: DC | PRN

## 2021-08-09 MED ORDER — ACETAMINOPHEN 325 MG PO TABS
650.0000 mg | ORAL_TABLET | ORAL | Status: DC | PRN
  Filled 2021-08-09: qty 2

## 2021-08-09 MED ORDER — ONDANSETRON HCL 4 MG PO TABS: 4.0000 mg | ORAL_TABLET | ORAL | Status: DC | PRN

## 2021-08-09 MED ORDER — ACETAMINOPHEN 500 MG PO TABS
1000.0000 mg | ORAL_TABLET | Freq: Four times a day (QID) | ORAL | Status: DC | PRN
Start: 2021-08-09 — End: 2021-08-09
  Administered 2021-08-09: 1000 mg via ORAL
  Filled 2021-08-09: qty 2

## 2021-08-09 MED ORDER — LACTATED RINGERS IV SOLN: 500.0000 mL | Freq: Once | INTRAVENOUS | Status: DC

## 2021-08-09 MED ORDER — MEASLES, MUMPS & RUBELLA VAC IJ SOLR: 0.5000 mL | Freq: Once | INTRAMUSCULAR | Status: DC

## 2021-08-09 MED ORDER — TRANEXAMIC ACID-NACL 1000-0.7 MG/100ML-% IV SOLN
1000.0000 mg | INTRAVENOUS | Status: AC
  Administered 2021-08-09: 1000 mg via INTRAVENOUS

## 2021-08-09 MED ORDER — WITCH HAZEL-GLYCERIN EX PADS
1.0000 | MEDICATED_PAD | CUTANEOUS | Status: DC | PRN
  Administered 2021-08-10: 1 via TOPICAL

## 2021-08-09 MED ORDER — SODIUM CHLORIDE 0.9 % IV SOLN: INTRAVENOUS | Status: DC | PRN

## 2021-08-09 MED ORDER — BENZOCAINE-MENTHOL 20-0.5 % EX AERO
1.0000 | INHALATION_SPRAY | CUTANEOUS | Status: DC | PRN
  Filled 2021-08-09: qty 56

## 2021-08-09 MED ORDER — DIPHENHYDRAMINE HCL 25 MG PO CAPS
25.0000 mg | ORAL_CAPSULE | Freq: Four times a day (QID) | ORAL | Status: DC | PRN
  Administered 2021-08-10: 25 mg via ORAL
  Filled 2021-08-09: qty 1

## 2021-08-09 MED ORDER — NIFEDIPINE ER OSMOTIC RELEASE 30 MG PO TB24
30.0000 mg | ORAL_TABLET | Freq: Every day | ORAL | Status: DC
  Administered 2021-08-10 – 2021-08-11 (×2): 30 mg via ORAL
  Filled 2021-08-09 (×2): qty 1

## 2021-08-09 MED ORDER — LACTATED RINGERS IV SOLN: INTRAVENOUS | Status: DC

## 2021-08-09 MED ORDER — FENTANYL-BUPIVACAINE-NACL 0.5-0.125-0.9 MG/250ML-% EP SOLN
12.0000 mL/h | EPIDURAL | Status: DC | PRN
  Administered 2021-08-09: 12 mL/h via EPIDURAL
  Filled 2021-08-09: qty 250

## 2021-08-09 MED ORDER — MAGNESIUM SULFATE 40 GM/1000ML IV SOLN
2.0000 g/h | INTRAVENOUS | Status: AC
  Administered 2021-08-10: 2 g/h via INTRAVENOUS
  Filled 2021-08-09: qty 4000

## 2021-08-09 MED ORDER — COCONUT OIL OIL: 1.0000 | TOPICAL_OIL | Status: DC | PRN

## 2021-08-09 MED ORDER — SIMETHICONE 80 MG PO CHEW: 80.0000 mg | CHEWABLE_TABLET | ORAL | Status: DC | PRN

## 2021-08-09 MED ORDER — EPHEDRINE 5 MG/ML INJ: 10.0000 mg | INTRAVENOUS | Status: DC | PRN

## 2021-08-09 MED ORDER — LIDOCAINE HCL (PF) 1 % IJ SOLN
INTRAMUSCULAR | Status: DC | PRN
  Administered 2021-08-09: 5 mL via EPIDURAL

## 2021-08-09 MED ORDER — SODIUM CHLORIDE 0.9% FLUSH
3.0000 mL | Freq: Two times a day (BID) | INTRAVENOUS | Status: DC
  Administered 2021-08-10 – 2021-08-11 (×2): 3 mL via INTRAVENOUS

## 2021-08-09 MED ORDER — LIDOCAINE-EPINEPHRINE (PF) 1.5 %-1:200000 IJ SOLN
INTRAMUSCULAR | Status: DC | PRN
  Administered 2021-08-09: 5 mL via EPIDURAL

## 2021-08-09 NOTE — Anesthesia Preprocedure Evaluation (Signed)
Anesthesia Evaluation  Patient identified by MRN, date of birth, ID band Patient awake    Reviewed: Allergy & Precautions, H&P , NPO status , Patient's Chart, lab work & pertinent test results  History of Anesthesia Complications Negative for: history of anesthetic complications  Airway Mallampati: II  TM Distance: >3 FB Neck ROM: full    Dental no notable dental hx. (+) Teeth Intact   Pulmonary asthma , former smoker,    Pulmonary exam normal        Cardiovascular hypertension, Normal cardiovascular exam     Neuro/Psych  Headaches, negative psych ROS   GI/Hepatic Neg liver ROS, GERD  Medicated,  Endo/Other  negative endocrine ROS  Renal/GU negative Renal ROS     Musculoskeletal negative musculoskeletal ROS (+)   Abdominal (+) + obese,   Peds  Hematology  (+) Blood dyscrasia, anemia ,   Anesthesia Other Findings On MAG  Reproductive/Obstetrics (+) Pregnancy                             Anesthesia Physical Anesthesia Plan  ASA: 3  Anesthesia Plan: Epidural   Post-op Pain Management:    Induction:   PONV Risk Score and Plan:   Airway Management Planned:   Additional Equipment:   Intra-op Plan:   Post-operative Plan:   Informed Consent: I have reviewed the patients History and Physical, chart, labs and discussed the procedure including the risks, benefits and alternatives for the proposed anesthesia with the patient or authorized representative who has indicated his/her understanding and acceptance.       Plan Discussed with:   Anesthesia Plan Comments:         Anesthesia Quick Evaluation

## 2021-08-09 NOTE — Progress Notes (Signed)
Patient's balloon came out.   Cervix checked 5 cm. AROMed during current check with clear fluid. Tolerated well by fetus and patient.  Will continue pitocin at this time  Warner Mccreedy, MD, MPH OB Fellow, Faculty Practice

## 2021-08-09 NOTE — Discharge Summary (Signed)
   Postpartum Discharge Summary  Date of Service updated***     Patient Name: Michele Gardner DOB: 01/15/1986 MRN: [id number]  Date of admission: 08/08/2021 Delivery date:08/09/2021  Delivering provider: DAWSON, ROLITTA  Date of discharge: 08/10/2021  Admitting diagnosis: Severe preeclampsia [O14.10] Intrauterine pregnancy: [redacted]w[redacted]d     Secondary diagnosis:  Principal Problem:   Severe preeclampsia Active Problems:   Chronic hypertension affecting pregnancy   Supervision of high risk pregnancy, antepartum   Abnormal TSH   Known fetal anomaly - echogenic bowel   Migraine without aura and without status migrainosus, not intractable   Iron deficiency anemia during pregnancy   Chronic hypertension with superimposed pre-eclampsia  Additional problems: ***    Discharge diagnosis: Preeclampsia (severe) and CHTN with superimposed preeclampsia                                              Post partum procedures:{Postpartum procedures:23558} Augmentation: AROM, Pitocin, Cytotec, and IP Foley Complications: None  Hospital course: Induction of Labor With Vaginal Delivery   35 y.o. yo G4P2113 at [redacted]w[redacted]d was admitted to the hospital 08/08/2021 for induction of labor.  Indication for induction: Preeclampsia.  Patient had an uncomplicated labor course as follows: Membrane Rupture Time/Date: 1:10 AM ,08/09/2021   Delivery Method:Vaginal, Spontaneous  Episiotomy: None  Lacerations:  Periurethral  Details of delivery can be found in separate delivery note.  Patient had a routine postpartum course. Patient is discharged home 08/10/21.  Newborn Data: Birth date:08/09/2021  Birth time:11:42 AM  Gender:Female  Living status:Living  Apgars:6 ,7  Weight:3090 g   Magnesium Sulfate received: Yes: Seizure prophylaxis BMZ received: No Rhophylac:No MMR:No T-DaP:Given prenatally Flu: No Transfusion:{Transfusion received:30440034}  Physical exam  Vitals:   08/10/21 1000 08/10/21 1115 08/10/21 1200  08/10/21 1441  BP:    125/89  Pulse:    78  Resp: 17 17 16 16  Temp:    98 F (36.7 C)  TempSrc:    Oral  SpO2:    98%  Weight:      Height:       General: {Exam; general:21111117} Lochia: {Desc; appropriate/inappropriate:30686::"appropriate"} Uterine Fundus: {Desc; firm/soft:30687} Incision: {Exam; incision:21111123} DVT Evaluation: {Exam; dvt:2111122} Labs: Lab Results  Component Value Date   WBC 14.8 (H) 08/09/2021   HGB 11.6 (L) 08/09/2021   HCT 35.6 (L) 08/09/2021   MCV 87.0 08/09/2021   PLT 224 08/09/2021      Latest Ref Rng & Units 08/09/2021    7:52 PM  CMP  Glucose 70 - 99 mg/dL 107   BUN 6 - 20 mg/dL 6   Creatinine 0.44 - 1.00 mg/dL 0.82   Sodium 135 - 145 mmol/L 135   Potassium 3.5 - 5.1 mmol/L 3.6   Chloride 98 - 111 mmol/L 101   CO2 22 - 32 mmol/L 22   Calcium 8.9 - 10.3 mg/dL 7.2   Total Protein 6.5 - 8.1 g/dL 5.4   Total Bilirubin 0.3 - 1.2 mg/dL 0.5   Alkaline Phos 38 - 126 U/L 69   AST 15 - 41 U/L 26   ALT 0 - 44 U/L 14    Edinburgh Score:     No data to display           After visit meds:  Allergies as of 08/10/2021       Reactions   Bee Venom Anaphylaxis       Med Rec must be completed prior to using this SMARTLINK***        Discharge home in stable condition Infant Feeding: {Baby feeding:23562} Infant Disposition:{CHL IP OB HOME WITH MOTHER:23581} Discharge instruction: per After Visit Summary and Postpartum booklet. Activity: Advance as tolerated. Pelvic rest for 6 weeks.  Diet: routine diet Future Appointments: Future Appointments  Date Time Provider Department Center  11/11/2021 11:00 AM Teague Clark, Karen E, PA-C [facility]   Follow up Visit:  Message sent to [facility] by Dr Beard:  Please schedule this patient for a In person postpartum visit in 6 weeks with the following provider: Any provider. Additional Postpartum F/U:BP check 1 week  High risk pregnancy complicated by:  cHTN w/ SIPE w/ SF  Delivery  mode:  Vaginal, Spontaneous  Anticipated Birth Control:  Unsure   08/10/2021 Samantha N Beard, DO    

## 2021-08-09 NOTE — Progress Notes (Signed)
Labor Progress Note Michele Gardner is a 36 y.o. [phone] at [redacted]w[redacted]d presented for IOL for cHTN with SIPE (BP and headache) S:  Very uncomfortable with contractions.   O:  BP (!) 146/90   Pulse 75   Temp 98.3 F (36.8 C) (Oral)   Resp 18   Ht 5\' 6"  (1.676 m)   Wt 104.3 kg   LMP  (LMP Unknown)   SpO2 98%   BMI 37.12 kg/m  EFM: 125/moderate (at times minimal but after epidural)/+accels/+ decels (variables) CVE: Dilation: 7.5 Effacement (%): 60 Station: -1 Presentation: Vertex Exam by:: Dr. Ephriam Jenkins  IUPC placed and amnioinfusion started  A&P: 37 y.o. A5W0981 at [redacted]w[redacted]d presented for IOL for cHTN with SIPE (BP and headache) #Labor: Cervix now 7-7.5cm, still some thickness to it. Patient uncomfortable and wanting to push. We discussed the importance of not pushing at this time given still some cervix left. Patient also previously requested pitocin to be off and was turned off. Currently having variables so pitocin left off and getting amnioinfusion. Will plan for pit to be restarted once tracing improves and patient more comfortable.   Discussed pain control options again. Patient initially wanting to get IV pain medications but then decided on epidural. Needs platelets redrawn since cHTN with SIPE prior to epidural. Will give dose of IV fentanyl while awaiting labs   #Pain: Planning epidural #FWB: Cat II for variables, improved with amnioinfusion.  #GBS unknown on PCN  #cHTN with SIPE BP in mild range (130s-140s). Continue PO labetalol TID. On Mg. Will get repeat PreE labs and Mg level now.  Warner Mccreedy, MD, MPH OB Fellow, Faculty Practice

## 2021-08-09 NOTE — Lactation Note (Signed)
This note was copied from a baby's chart. Lactation Consultation Note  Patient Name: Michele Gardner [id number] Date: 08/09/2021   Age:34 hours  Spoke to [facility] RN [name] and she reported that baby was transferred to NICU. Mom is not ready to start pumping or for a visit at the end of this LC shift. RN will pass on report to night shift to get [name] set up with a DEBP. NICU LC to follow up tomorrow for initial assessment.   [name] S Jashan Cotten 08/09/2021, 6:56 PM

## 2021-08-10 ENCOUNTER — Encounter: Payer: Self-pay | Admitting: Student

## 2021-08-10 ENCOUNTER — Encounter: Payer: Medicaid Other | Admitting: Obstetrics and Gynecology

## 2021-08-10 ENCOUNTER — Encounter (HOSPITAL_COMMUNITY): Payer: Self-pay | Admitting: Obstetrics & Gynecology

## 2021-08-10 DIAGNOSIS — O119 Pre-existing hypertension with pre-eclampsia, unspecified trimester: Secondary | ICD-10-CM

## 2021-08-10 DIAGNOSIS — B951 Streptococcus, group B, as the cause of diseases classified elsewhere: Secondary | ICD-10-CM | POA: Insufficient documentation

## 2021-08-10 MED ORDER — FUROSEMIDE 20 MG PO TABS
20.0000 mg | ORAL_TABLET | Freq: Two times a day (BID) | ORAL | Status: DC
Start: 2021-08-10 — End: 2021-08-11
  Administered 2021-08-10 – 2021-08-11 (×2): 20 mg via ORAL
  Filled 2021-08-10 (×2): qty 1

## 2021-08-10 NOTE — Lactation Note (Signed)
This note was copied from a baby's chart.  NICU Lactation Consultation Note  Patient Name: Michele Gardner [id number] Date: 08/10/2021 Age:36 years   Subjective Reason for consult: Initial assessment I returned to [facility] and assisted with pump setup/education and initial pumping. Mother plans to receive a pump from [facility]; I faxed request. Mother bf her last baby for 3 years and plans to bf this infant. She denies hx breast surgery/trauma.   Objective Infant data: Mother's Current Feeding Choice: Breast Milk  Infant feeding assessment Scale for Readiness: 2 Scale for Quality: 3    Maternal data: [id number]  Vaginal, Spontaneous Does the patient have breastfeeding experience prior to this delivery?: Yes How long did the patient breastfeed?: 3 years [facility] Referral Sent?: Yes  Intervention/Plan Interventions: "The NICU and Your Baby" book; [employer] brochure; Visual merchandiser education; Education  Plan: Consult Status: NICU follow-up  NICU Follow-up type: New admission follow up; Verify absence of engorgement; Weekly NICU follow up; Maternal D/C visit; Verify onset of copious milk; Assist with IDF-2 (Mother does not need to pre-pump before breastfeeding); Assist with IDF-1 (Mother to pre-pump before breastfeeding)    Elder Negus 08/10/2021, 5:47 PM

## 2021-08-10 NOTE — Progress Notes (Signed)
Patient screened out for psychosocial assessment since none of the following apply: °Psychosocial stressors documented in mother or baby's chart °Gestation less than 32 weeks °Code at delivery  °Infant with anomalies °Please contact the Clinical Social Worker if specific needs arise, by MOB's request, or if MOB scores greater than 9/yes to question 10 on Edinburgh Postpartum Depression Screen. ° °Michele Gardner, MSW, LCSW °Clinical Social Work °[phone] °  °

## 2021-08-10 NOTE — Progress Notes (Signed)
Post Partum Day 1 s/p SVD in setting of CHTN wSIPE with SF  Subjective: voiding, tolerating PO, and reports HA on magnesium. Benadryl has helped HA some but still present.   Objective: Blood pressure 140/76, pulse 91, temperature 97.9 F (36.6 C), temperature source Oral, resp. rate 16, height 5\' 6"  (1.676 m), weight 104.3 kg, SpO2 98 %, unknown if currently breastfeeding.  Physical Exam:  General: no distress and was sleeping Lochia: appropriate Uterine Fundus: firm Incision: not assessed DVT Evaluation: No cords or calf tenderness.  Recent Labs    08/08/21 1602 08/09/21 0628  HGB 11.5* 11.6*  HCT 34.2* 35.6*    Assessment/Plan: Postpartum - Contraception: Unsure - MOF: Breast - Rh status: Rh pos - Rubella status: Immune - Dispo: Anticipate D/C home tomorrow  CHTN with SIPE with SF - Magnesium until 24 hours post. HA likely due to Mag.  - Labs have been wnl - Lasix and Procardia to start today at 10am  Neonatal - Doing well   LOS: 2 days   08/11/21 08/10/2021, 6:50 AM

## 2021-08-10 NOTE — Lactation Note (Signed)
This note was copied from a baby's chart. Lactation Consultation Note I spoke with parents in NICU. Mother has not initiated pumping at 24 hours pp. She bf her last baby for 3 years and plans to bf this infant. I offered to meet her in [facility] unit and provide pump education. Mother agreed. Will f/u when she returns to her room. Written information provided at this visit.   Patient Name: Michele Gardner [id number] Date: 08/10/2021   Age:34 hours   Elder Negus 08/10/2021, 12:45 PM

## 2021-08-11 ENCOUNTER — Other Ambulatory Visit (HOSPITAL_COMMUNITY): Payer: Self-pay

## 2021-08-11 MED ORDER — NIFEDIPINE ER 30 MG PO TB24
30.0000 mg | ORAL_TABLET | Freq: Two times a day (BID) | ORAL | 1 refills | Status: DC
  Filled 2021-08-11: qty 60, 30d supply, fill #0

## 2021-08-11 MED ORDER — FUROSEMIDE 20 MG PO TABS
20.0000 mg | ORAL_TABLET | Freq: Two times a day (BID) | ORAL | 0 refills | Status: DC
  Filled 2021-08-11: qty 10, 5d supply, fill #0

## 2021-08-11 MED ORDER — IBUPROFEN 600 MG PO TABS
600.0000 mg | ORAL_TABLET | Freq: Four times a day (QID) | ORAL | 0 refills | Status: DC
  Filled 2021-08-11: qty 30, 8d supply, fill #0

## 2021-08-11 NOTE — Progress Notes (Signed)
Pt given discharge instructions and pt verbalized understanding. All questions answered. Pt awaiting pharmacy to deliver new prescription medications.

## 2021-08-12 ENCOUNTER — Ambulatory Visit: Payer: Medicaid Other

## 2021-08-12 ENCOUNTER — Other Ambulatory Visit: Payer: Medicaid Other

## 2021-08-14 ENCOUNTER — Ambulatory Visit: Payer: Self-pay

## 2021-08-14 ENCOUNTER — Encounter: Payer: Self-pay | Admitting: Family Medicine

## 2021-08-14 NOTE — Lactation Note (Signed)
This note was copied from a baby's chart.  NICU Lactation Consultation Note  Patient Name: Michele Gardner [id number] Date: 08/14/2021 Age:34 days   Subjective Reason for consult: Follow-up assessment; NICU baby; Late-preterm 34-36.6wks  Lactation followed up with Michele Gardner. She states that she is using a manual pump at home. I recommended that she call WIC this week to see if they can move up her appointment to collect a loaner breast pump. I also left a voicemail with the breastfeeding hotline after our consult.  She may be eligible for a Stork pump. I placed a referral after our consult, and I will follow up with [employer] to see if it has been approved.  Michele Gardner is pumping somewhat infrequently. I recommended that for optimal milk volume that she pump q3 hours or 8 times/day. She verbalized understanding.  I also discussed option to breastfeed baby Michele "Echo" if she wishes. She expressed interest; however she states that a concern is that it may be overwhelming to her and her baby to do breast and bottle. I acknowledged her concerns and provided reassurance. She will ask for help with latching baby when ready.  Objective Infant data: Mother's Current Feeding Choice: Breast Milk and Formula  Infant feeding assessment Scale for Readiness: 3 Scale for Quality: 3  Maternal data: [id number]  Vaginal, Spontaneous Current breast feeding challenges:: NICU; separation   Does the patient have breastfeeding experience prior to this delivery?: Yes How long did the patient breastfeed?: 3 years  Pumping frequency: 3 times/day Pumped volume: 45 mL  Risk factor for low milk supply:: manual pump at home   [facility] Referral Sent?: Yes Pump: Manual (follow up with WIC needed; stork pump referral placed)  Intervention/Plan  Tools: Pump Pump Education: Setup, frequency, and cleaning  Plan: Consult Status: NICU follow-up  NICU Follow-up type: Weekly NICU follow up    Walker Shadow 08/14/2021, 12:23 PM

## 2021-08-14 NOTE — Lactation Note (Signed)
This note was copied from a baby's chart. Lactation Consultation Note  Patient Name: Michele Gardner [id number] Date: 08/14/2021   Age:34 days  [employer] verified that Ms. Riemenschneider is eligible for a breast pump and they delivered it to the room. I called Ms. Cerveny, and she stated that she was able to collect the pump prior to departing the NICU and that she has already used it at home. She expressed appreciation for the assistance.  Walker Shadow 08/14/2021, 3:47 PM

## 2021-08-15 ENCOUNTER — Telehealth: Payer: Self-pay

## 2021-08-15 ENCOUNTER — Encounter: Payer: Self-pay | Admitting: Obstetrics and Gynecology

## 2021-08-15 NOTE — Telephone Encounter (Signed)
Spoke with Pt today, BP @ 10:30 am was 150/98, HA, blurry vision. Advised her of MD's note for her to go to MAU and she said that "she is unable to do so right now because she has 2 kids" I suggested that she try to find someone to keep the Children and make every effort to go to MAU today.  Pt verbalized understanding and agreement.   Your last blood pressure was slightly elevated on 08/13/21.  Please check your blood pressure today to make sure you are in range.  If there is any abnormal headace or visual changes, please go to MAU for further evaluation

## 2021-08-16 ENCOUNTER — Other Ambulatory Visit: Payer: Self-pay | Admitting: Obstetrics & Gynecology

## 2021-08-16 ENCOUNTER — Encounter: Payer: Self-pay | Admitting: Obstetrics & Gynecology

## 2021-08-16 DIAGNOSIS — I10 Essential (primary) hypertension: Secondary | ICD-10-CM

## 2021-08-16 MED ORDER — NIFEDIPINE ER 60 MG PO TB24
60.0000 mg | ORAL_TABLET | Freq: Two times a day (BID) | ORAL | 3 refills | Status: DC
Start: 2021-08-16 — End: 2021-08-17

## 2021-08-17 ENCOUNTER — Encounter: Payer: Medicaid Other | Admitting: Obstetrics and Gynecology

## 2021-08-17 ENCOUNTER — Ambulatory Visit (INDEPENDENT_AMBULATORY_CARE_PROVIDER_SITE_OTHER): Payer: Medicaid Other | Admitting: Emergency Medicine

## 2021-08-17 DIAGNOSIS — O165 Unspecified maternal hypertension, complicating the puerperium: Secondary | ICD-10-CM

## 2021-08-17 MED ORDER — ENALAPRIL MALEATE 10 MG PO TABS: 10.0000 mg | ORAL_TABLET | Freq: Every day | ORAL | 2 refills | Status: DC

## 2021-08-17 NOTE — Progress Notes (Signed)
Pt presents for PP BP check. BP 136/88 P 101, Dr. Donavan Foil aware, instructs pt to continue current prescribed medication regimine.  Pt reports headaches with Procardia, Dr. Macon Large prescribed Vasotec, pt states she doesn't want to switch and will manage BP with procardia.   Pt scheduled for BP check in 2 wks.

## 2021-08-17 NOTE — Progress Notes (Signed)
Patient was assessed and managed by nursing staff during this encounter. I have reviewed the chart and agree with the documentation and plan. I have also made any necessary editorial changes.  Jailin Manocchio A Gerrie Castiglia, MD 08/17/2021 12:49 PM   

## 2021-08-18 ENCOUNTER — Ambulatory Visit: Payer: Medicaid Other

## 2021-08-18 ENCOUNTER — Other Ambulatory Visit: Payer: Medicaid Other

## 2021-08-18 ENCOUNTER — Telehealth: Payer: Self-pay | Admitting: *Deleted

## 2021-08-18 NOTE — Telephone Encounter (Signed)
Pt called to office stating she needs refills on Lasix and Flexeril.   Please advise or send new Rx's.

## 2021-08-19 ENCOUNTER — Ambulatory Visit: Payer: Self-pay

## 2021-08-19 ENCOUNTER — Other Ambulatory Visit: Payer: Self-pay | Admitting: Physician Assistant

## 2021-08-19 ENCOUNTER — Other Ambulatory Visit: Payer: Self-pay | Admitting: *Deleted

## 2021-08-19 DIAGNOSIS — O10919 Unspecified pre-existing hypertension complicating pregnancy, unspecified trimester: Secondary | ICD-10-CM

## 2021-08-19 MED ORDER — CYCLOBENZAPRINE HCL 10 MG PO TABS: 10.0000 mg | ORAL_TABLET | Freq: Three times a day (TID) | ORAL | 2 refills | Status: DC | PRN

## 2021-08-19 MED ORDER — CYCLOBENZAPRINE HCL 10 MG PO TABS: 10.0000 mg | ORAL_TABLET | Freq: Three times a day (TID) | ORAL | 0 refills | Status: DC | PRN

## 2021-08-19 NOTE — Lactation Note (Signed)
This note was copied from a baby's chart.  NICU Lactation Consultation Note  Patient Name: Michele Gardner [id number] Date: 08/19/2021 Age:34 days  Subjective Reason for consult: Follow-up assessment; NICU baby; Early term 20-38.6wks  Visited with family today, FOB voiced that mom won't be coming into the hospital for baby's discharge because they have other kids at home. Noticed that baby has been on both breastmilk and formula while at the hospital, Ms. Bolar got her stork pump while in patient and she's been using it at home. Deferred to FOB to ask Ms. Preziosi to contact [facility] services post-discharge whenever is needed. All questions and concerns answered, family to contact [facility] services PRN.  Objective Infant data: Mother's Current Feeding Choice: Breast Milk and Formula  Infant feeding assessment Scale for Readiness: 2 Scale for Quality: 2  Maternal data: [id number]  Vaginal, Spontaneous WIC Referral Sent?: Yes Pump: Stork Pump  Assessment Infant: Feeding Status: Ad lib Dr. Levert Feinstein Preemie nipple  Intervention/Plan Interventions: Education  Plan: Consult Status: Complete   Zuria Fosdick S Terrell Shimko 08/19/2021, 1:58 PM

## 2021-08-19 NOTE — Progress Notes (Signed)
Call placed to pt regarding refill request.  Pt made aware she should not need refill on Lasix- usually a short course Rx. Pt also advised we can send 12 Flexeril for her but she should also f/u with original provider for any other refills.  Pt states she uses it for her migraines.

## 2021-08-19 NOTE — Progress Notes (Signed)
Pt requesting refill of flexeril prior to next appt.   Watch for sedation, do not operate heavy equipment.   Come for appt.

## 2021-08-22 ENCOUNTER — Telehealth: Payer: Self-pay

## 2021-08-22 NOTE — Telephone Encounter (Signed)
S/w pt and she stated that she has a hx of migraines and Procardia has been making it worse. Previoulsy prescribed Enalapril but decided not to start due to possibly decreasing milk supply. Pt has not taken Procardia since yesterday and reports BP of 168/112, denies abnormal symptoms. Advised that in office provider approved taking Enalapril, advised pt to use lactation supplements if needed and report to mau if abnormal symptoms occur, pt agreed. BP check is scheduled next week in office

## 2021-08-24 ENCOUNTER — Telehealth: Payer: Self-pay

## 2021-08-24 NOTE — Telephone Encounter (Signed)
S/w pt after elevated BP of 156/91 and 160/100 through babyscripts. Pt stopped procardia and started enalapril yesterday, denies abnormal symptoms other than headache; but has hx of migraines. Consulted with in office provider and advised pt to continue medication, f/u if abnormal symptoms occur, BP check scheduled for 7/19.

## 2021-08-24 NOTE — Telephone Encounter (Signed)
Attempted to call after babyscripts BP alert, no answer, left vm

## 2021-08-31 ENCOUNTER — Ambulatory Visit (INDEPENDENT_AMBULATORY_CARE_PROVIDER_SITE_OTHER): Payer: Medicaid Other

## 2021-08-31 DIAGNOSIS — I10 Essential (primary) hypertension: Secondary | ICD-10-CM

## 2021-08-31 MED ORDER — ENALAPRIL MALEATE 10 MG PO TABS: 20.0000 mg | ORAL_TABLET | Freq: Every day | ORAL | 0 refills | Status: DC

## 2021-08-31 NOTE — Progress Notes (Signed)
..  Subjective:  Michele Gardner is a 36 y.o. female here for BP check.   Hypertension ROS: taking medications as instructed, no medication side effects noted, no TIA's, no chest pain on exertion, no dyspnea on exertion, and no swelling of ankles.    Objective:  BP (!) 165/113   Pulse 69   LMP  (LMP Unknown)   Appearance alert, well appearing, and in no distress. General exam BP noted to be well controlled today in office.    Assessment:   Blood Pressure poorly controlled.   Plan:  The following changes are to be made: Consulted with provider and patient will Increase Enalapril to 20mg  Daily and return in 1 week for BP check. Advised pt to report if abnormal symptoms occur.

## 2021-09-08 ENCOUNTER — Ambulatory Visit: Payer: Medicaid Other | Admitting: *Deleted

## 2021-09-08 VITALS — BP 152/110 | HR 73 | Wt 203.0 lb

## 2021-09-08 DIAGNOSIS — O165 Unspecified maternal hypertension, complicating the puerperium: Secondary | ICD-10-CM

## 2021-09-08 MED ORDER — TRIAMTERENE-HCTZ 37.5-25 MG PO TABS: 1.0000 | ORAL_TABLET | Freq: Every day | ORAL | 1 refills | Status: DC

## 2021-09-08 NOTE — Progress Notes (Signed)
Subjective:  Michele Gardner is a 36 y.o. female here for BP check.   Hypertension ROS: taking medications as instructed, no medication side effects noted, no TIA's, no chest pain on exertion, no dyspnea on exertion, and no swelling of ankles.    Objective:  BP (!) 152/110   Pulse 73   Wt 203 lb (92.1 kg)   LMP  (LMP Unknown)   BMI 32.77 kg/m   Appearance alert, well appearing, and in no distress.  General exam BP noted not well controlled today in office.    Assessment:   Blood Pressure needs improvement.   Plan:  Maxzide-25 ordered per Dr Alysia Penna.  Taken in addition to current medication. F/U 1 week for BP check. Keep upcoming PP appt.

## 2021-09-08 NOTE — Progress Notes (Signed)
Agree with nurses's documentation of this patient's clinic encounter.  Courvoisier Hamblen L, MD  

## 2021-09-15 ENCOUNTER — Ambulatory Visit: Payer: Medicaid Other

## 2021-09-15 ENCOUNTER — Telehealth: Payer: Self-pay | Admitting: Emergency Medicine

## 2021-09-15 NOTE — Telephone Encounter (Signed)
TC to patient. Missed PP BP check visit today. Current BP is 166/124, second reading 157/108. Endorses headaches, bulging pain in shoulder. Denies chest pains, SOB. Denies swelling.  Following consult with Debroah Loop, MD, Patient should increase Vasotec to 40mg  daily. Patient verbalized understanding. Reminded of PP apt next week.

## 2021-09-22 ENCOUNTER — Ambulatory Visit (INDEPENDENT_AMBULATORY_CARE_PROVIDER_SITE_OTHER): Payer: Medicaid Other | Admitting: Family Medicine

## 2021-09-22 DIAGNOSIS — O10919 Unspecified pre-existing hypertension complicating pregnancy, unspecified trimester: Secondary | ICD-10-CM | POA: Diagnosis not present

## 2021-09-22 NOTE — Progress Notes (Signed)
Post Partum Visit Note  Michele Gardner is a 36 y.o. [phone] female who presents for a postpartum visit. She is 6 weeks postpartum following a normal spontaneous vaginal delivery.  I have fully reviewed the prenatal and intrapartum course. The delivery was at 36 gestational weeks.  Anesthesia: epidural. Postpartum course has been complicated by uncontrolled blood pressures, patient currently on Maxide and enalapril. Baby is doing well. Baby is feeding by bottle - . . Bleeding no bleeding. Bowel function is normal. Bladder function is normal. Patient is sexually active. Contraception method is none. Postpartum depression screening: negative.   The pregnancy intention screening data noted above was reviewed. Potential methods of contraception were discussed. The patient elected to proceed with No data recorded.    Health Maintenance Due  Topic Date Due   COVID-19 Vaccine (1) Never done   INFLUENZA VACCINE  09/13/2021    The following portions of the patient's history were reviewed and updated as appropriate: allergies, current medications, past family history, past medical history, past social history, past surgical history, and problem list.  Review of Systems Pertinent items are noted in HPI.  Objective:  LMP  (LMP Unknown)    General:  alert, cooperative, and appears stated age   Breasts:  normal  Lungs: clear to auscultation bilaterally  Heart:  regular rate and rhythm  Abdomen: soft, non-tender; bowel sounds normal; no masses,  no organomegaly   Wound none  GU exam:  not indicated       Assessment:  1. Encounter for postpartum visit  2. Chronic hypertension affecting pregnancy Patient currently on enalapril and Vasotec.  Blood pressure elevated but not in severe range today.  Patient did not want to add more medication to her regimen.  Discussed lifestyle modifications including significant diet changes, decreased sodium intake and increase in exercise regimen.  Patient  will follow-up within a month and if her blood pressure remains elevated, will alter her regimen.  Normal postpartum exam.   Plan:   Essential components of care per ACOG recommendations:  1.  Mood and well being: Patient with negative depression screening today. Reviewed local resources for support.  - Patient tobacco use? No.   - hx of drug use? No.    2. Infant care and feeding:  -Patient currently breastmilk feeding? No.  -Social determinants of health (SDOH) reviewed in EPIC. No concerns.  3. Sexuality, contraception and birth spacing - Patient does not want a pregnancy in the next year.  Desired family size is 3 children.  - Reviewed reproductive life planning. Reviewed contraceptive methods based on pt preferences and effectiveness.  Patient desired Vasectomy today.   - Discussed birth spacing of 18 months  4. Sleep and fatigue -Encouraged family/partner/community support of 4 hrs of uninterrupted sleep to help with mood and fatigue  5. Physical Recovery  - Discussed patients delivery and complications. She describes her labor as mixed. - Patient had a Vaginal, no problems at delivery. Patient had a  periurethral  laceration. Perineal healing reviewed. Patient expressed understanding - Patient has urinary incontinence? No. - Patient is safe to resume physical and sexual activity  6.  Health Maintenance - HM due items addressed Yes - Last pap smear  Diagnosis  Date Value Ref Range Status  08/08/2019   Final   - Negative for intraepithelial lesion or malignancy (NILM)   Pap smear not done at today's visit.  -Breast Cancer screening indicated? No.   7. Chronic Disease/Pregnancy Condition follow up: Hypertension  -  PCP follow up  Myrtie Hawk, DO Center for [employer], [facility]

## 2021-10-07 ENCOUNTER — Ambulatory Visit (INDEPENDENT_AMBULATORY_CARE_PROVIDER_SITE_OTHER): Payer: Medicaid Other | Admitting: Obstetrics and Gynecology

## 2021-10-07 VITALS — BP 150/107 | HR 69 | Wt 197.0 lb

## 2021-10-07 DIAGNOSIS — Z3009 Encounter for other general counseling and advice on contraception: Secondary | ICD-10-CM

## 2021-10-07 DIAGNOSIS — O165 Unspecified maternal hypertension, complicating the puerperium: Secondary | ICD-10-CM

## 2021-10-07 NOTE — Progress Notes (Signed)
  CC: Subjective:    Patient ID: Michele Gardner, female    DOB: Dec 03, 1985, 36 y.o.   MRN: [id number]  HPI Pt presents for IUD placemen; however, she had unprotected sex less than 1 weeks ago.  Will place next week when Upt will be valid. Pt continues to have uncontrolled BP even with meds maxed out.  She remains asymptomatic.   Review of Systems     Objective:   Physical Exam Vitals:   10/07/21 1047  BP: (!) 150/107  Pulse: 69         Assessment & Plan:   1. Postpartum hypertension Will refer pt to [facility] Cardio clinic for further management and BP control  - AMB Referral to Cardio Obstetrics  2. Encounter for counseling regarding initiation of other contraceptive measure Pt to return next week for progesterone IUD placement.   I spent 10 minutes dedicated to the care of this patient including previsit review of records, face to face time with the patient discussing other treatment options and contraceptive treatment plan and post visit testing.    Warden Fillers, MD Faculty Attending, Center for [facility]

## 2021-10-07 NOTE — Progress Notes (Signed)
Pt is interested in [facility] options.  Pt has elevated BP - taking medication as prescribed.   Pt is interested in IUD. Pt LMP was 09/26/21. Last intercourse 10/01/21.

## 2021-10-11 ENCOUNTER — Ambulatory Visit (INDEPENDENT_AMBULATORY_CARE_PROVIDER_SITE_OTHER): Payer: Medicaid Other | Admitting: Obstetrics and Gynecology

## 2021-10-11 VITALS — BP 136/96 | HR 75 | Wt 195.0 lb

## 2021-10-11 DIAGNOSIS — Z30430 Encounter for insertion of intrauterine contraceptive device: Secondary | ICD-10-CM

## 2021-10-11 NOTE — Progress Notes (Signed)
    GYNECOLOGY OFFICE PROCEDURE NOTE  Michele Gardner is a 36 y.o. [phone] here for MirenaIUD insertion. No GYN concerns.  Last pap smear was on 08/08/19 and was normal.  IUD Insertion Procedure Note Patient identified, informed consent performed, consent signed.   Discussed risks of irregular bleeding, cramping, infection, malpositioning or misplacement of the IUD outside the uterus which may require further procedure such as laparoscopy. Also discussed >99% contraception efficacy, increased risk of ectopic pregnancy with failure of method.  Time out was performed.  Urine pregnancy test negative.  Speculum placed in the vagina.  Cervix visualized.  Cleaned with Betadine x 2.  Grasped anteriorly with a single tooth tenaculum.  Uterus sounded to 8.5 cm.  Mirena IUD placed per manufacturer's recommendations.  Strings trimmed to 3 cm. Tenaculum was removed, good hemostasis noted.  Patient tolerated procedure well.   Patient was given post-procedure instructions.  She was advised to have backup contraception for one week.  Patient was also asked to check IUD strings periodically and follow up in 4 weeks for IUD check.   [name], MD, FACOG Obstetrician & Gynecologist, [facility] for [facility], [facility]

## 2021-10-12 MED ORDER — LEVONORGESTREL 20 MCG/DAY IU IUD
1.0000 | INTRAUTERINE_SYSTEM | Freq: Once | INTRAUTERINE | Status: AC
  Administered 2021-10-11: 1 via INTRAUTERINE

## 2021-10-12 NOTE — Addendum Note (Signed)
Addended by: Marya Landry D on: 10/12/2021 08:24 AM   Modules accepted: Orders

## 2021-10-13 ENCOUNTER — Telehealth: Payer: Self-pay

## 2021-10-13 NOTE — Telephone Encounter (Signed)
Pt called back, appointment made for Tuesday, Sept 5.

## 2021-10-13 NOTE — Telephone Encounter (Signed)
Called pt to set up appointment with Dr. Servando Salina on 9/5 per Dr. Mallory Shirk request. No answer at this time. Left message for pt to return call Okay to double book per Dr. Servando Salina.

## 2021-10-14 ENCOUNTER — Other Ambulatory Visit: Payer: Self-pay | Admitting: Obstetrics and Gynecology

## 2021-10-14 DIAGNOSIS — O165 Unspecified maternal hypertension, complicating the puerperium: Secondary | ICD-10-CM

## 2021-10-18 ENCOUNTER — Encounter: Payer: Self-pay | Admitting: Cardiology

## 2021-10-18 ENCOUNTER — Ambulatory Visit: Payer: Medicaid Other | Attending: Cardiology | Admitting: Cardiology

## 2021-10-18 VITALS — BP 160/120 | HR 71 | Ht 66.0 in | Wt 198.8 lb

## 2021-10-18 DIAGNOSIS — Z79899 Other long term (current) drug therapy: Secondary | ICD-10-CM

## 2021-10-18 DIAGNOSIS — E669 Obesity, unspecified: Secondary | ICD-10-CM | POA: Diagnosis not present

## 2021-10-18 DIAGNOSIS — O165 Unspecified maternal hypertension, complicating the puerperium: Secondary | ICD-10-CM

## 2021-10-18 DIAGNOSIS — I701 Atherosclerosis of renal artery: Secondary | ICD-10-CM

## 2021-10-18 LAB — BASIC METABOLIC PANEL
BUN/Creatinine Ratio: 9 (ref 9–23)
BUN: 8 mg/dL (ref 6–20)
CO2: 23 mmol/L (ref 20–29)
Calcium: 9.2 mg/dL (ref 8.7–10.2)
Chloride: 105 mmol/L (ref 96–106)
Creatinine, Ser: 0.86 mg/dL (ref 0.57–1.00)
Glucose: 90 mg/dL (ref 70–99)
Potassium: 3.9 mmol/L (ref 3.5–5.2)
Sodium: 140 mmol/L (ref 134–144)
eGFR: 91 mL/min/{1.73_m2} (ref >59)

## 2021-10-18 LAB — MAGNESIUM: Magnesium: 1.9 mg/dL (ref 1.6–2.3)

## 2021-10-18 MED ORDER — VALSARTAN 80 MG PO TABS: 80.0000 mg | ORAL_TABLET | Freq: Every day | ORAL | 3 refills | Status: DC

## 2021-10-18 NOTE — Progress Notes (Signed)
Cardio-Obstetrics Clinic  New Evaluation  Date:  10/18/2021   ID:  Michele Gardner, DOB 1985-08-07, MRN [id number]  PCP:  Arlyce Dice, [place] [facility] Cardiologist:  Berniece Salines, DO  Electrophysiologist:  None       Referring MD: Arlyce Dice, MD   Chief Complaint: " I am   History of Present Illness:    Michele Gardner is a 36 y.o. female [[id number]] who is being seen today for the evaluation of postpartum hypertension at the request of Dr. Lynnda Shields.  Medical history includes chronic hypertension with accelerated postpartum hypertension and asthma.  The patient tells me that she was diagnosed with hypertension about 5 years ago during her pregnancy with her first child was when she was advised of this.  However during her pregnancy she did not require any antihypertensive medication but during the postpartum.  She is elevated and she required antihypertensives. She has been placed on amlodipine but had had significant headache and could not tolerate this medication I will stop.  She did not took lisinopril which did not help and she was started on HCTZ which she had some relief.  During her pregnancy with her second child which she just deliver she was treated with labetalol.  Postpartum she has been started on hydrochlorothiazide triamterene as well as enalapril.  Prior CV Studies Reviewed: The following studies were reviewed today: None today   Past Medical History:  Diagnosis Date   Asthma    Hypertension     Past Surgical History:  Procedure Laterality Date   WISDOM TOOTH EXTRACTION  2007      OB History     Gravida  4   Para  3   Term  2   Preterm  1   AB  1   Living  3      SAB  0   IAB  1   Ectopic  0   Multiple  0   Live Births  3               Current Medications: Current Meds  Medication Sig   Blood Pressure Monitoring (BLOOD PRESSURE KIT) DEVI 1 kit by Does not apply route once a week.    Butalbital-APAP-Caffeine 50-325-40 MG capsule Take 1-2 capsules by mouth every 6 (six) hours as needed for headache.   ibuprofen (ADVIL) 600 MG tablet Take 1 tablet (600 mg total) by mouth every 6 (six) hours.   Prenatal Vit-Fe Fumarate-FA (PRENATAL MULTIVITAMIN) TABS tablet Take 1 tablet by mouth daily at 12 noon.   triamterene-hydrochlorothiazide (MAXZIDE-25) 37.5-25 MG tablet Take 1 tablet by mouth daily.   valsartan (DIOVAN) 80 MG tablet Take 1 tablet (80 mg total) by mouth daily.   [DISCONTINUED] enalapril (VASOTEC) 10 MG tablet Take 2 tablets (20 mg total) by mouth daily.     Allergies:   Bee venom   Social History   Socioeconomic History   Marital status: Single    Spouse name: Not on file   Number of children: Not on file   Years of education: Not on file   Highest education level: Not on file  Occupational History   Not on file  Tobacco Use   Smoking status: Former    Packs/day: 0.50    Types: Cigarettes    Quit date: 04/23/2015    Years since quitting: 6.4   Smokeless tobacco: Never  Vaping Use   Vaping Use: Never used  Substance and Sexual Activity  Alcohol use: No   Drug use: No   Sexual activity: Yes    Partners: Male    Birth control/protection: None  Other Topics Concern   Not on file  Social History Narrative   Not on file   Social Determinants of Health   Financial Resource Strain: Not on file  Food Insecurity: Not on file  Transportation Needs: Not on file  Physical Activity: Not on file  Stress: Not on file  Social Connections: Not on file      Family History  Problem Relation Age of Onset   Hypertension Mother    Hyperlipidemia Mother    Varicose Veins Father    Melanoma Father    Alcohol abuse Father    Cancer Maternal Grandfather    Diabetes Paternal Grandmother    Cancer Paternal Grandmother    Diabetes Paternal Grandfather    Brain cancer Other       ROS:   Please see the history of present illness.     All other systems  reviewed and are negative.   Labs/EKG Reviewed:    EKG:   EKG is was done today which show evidence of sinus rhythm.  Recent Labs: 06/03/2021: TSH 0.528 08/09/2021: ALT 14; BUN 6; Creatinine, Ser 0.82; Hemoglobin 11.6; Magnesium 5.3; Platelets 224; Potassium 3.6; Sodium 135   Recent Lipid Panel No results found for: "CHOL", "TRIG", "HDL", "CHOLHDL", "LDLCALC", "LDLDIRECT"  Physical Exam:    VS:  BP (!) 160/120   Pulse 71   Ht 5' 6"  (1.676 m)   Wt 198 lb 12.8 oz (90.2 kg)   LMP 09/26/2021   SpO2 98%   Breastfeeding No   BMI 32.09 kg/m     Wt Readings from Last 3 Encounters:  10/18/21 198 lb 12.8 oz (90.2 kg)  10/11/21 195 lb (88.5 kg)  10/07/21 197 lb (89.4 kg)     GEN:  Well nourished, well developed in no acute distress HEENT: Normal NECK: No JVD; No carotid bruits LYMPHATICS: No lymphadenopathy CARDIAC: RRR, no murmurs, rubs, gallops RESPIRATORY:  Clear to auscultation without rales, wheezing or rhonchi  ABDOMEN: Soft, non-tender, non-distended MUSCULOSKELETAL:  No edema; No deformity  SKIN: Warm and dry NEUROLOGIC:  Alert and oriented x 3 PSYCHIATRIC:  Normal affect    Risk Assessment/Risk Calculators:                 ASSESSMENT & PLAN:    Postpartum accelerated hypertension Obesity  She is experiencing increased blood pressure.  She has missed her enalapril for 2 days.  But had continue the triamterene hydrochlorothiazide combination pill.  We will stop enalapril and start the patient on valsartan 80 mg daily. She is not breast-feeding so our options for antihypertensives are not limited.  Her goal is less than 130/80 mmHg.  We will get an echocardiogram to assess for any abnormalities/wall thickness and also for diastolic dysfunction. I also like to get an ultrasound the kidney to rule out any renal artery stenosis.  I have asked the patient to take her blood pressure daily and send me information in 2 weeks via MyChart to review and adjust  medications if appropriate.  The patient understands the need to lose weight with diet and exercise. We have discussed specific strategies for this.  We will follow the patient closely see her in 6 weeks.  Patient Instructions  Medication Instructions:  Your physician has recommended you make the following change in your medication:  STOP: Enalapril (Vasotec)  START: Valsartan 80  mg once daily  Please take your blood pressure daily for 2 weeks and send in a MyChart message. Please include heart rates.   HOW TO TAKE YOUR BLOOD PRESSURE: Rest 5 minutes before taking your blood pressure. Don't smoke or drink caffeinated beverages for at least 30 minutes before. Take your blood pressure before (not after) you eat. Sit comfortably with your back supported and both feet on the floor (don't cross your legs). Elevate your arm to heart level on a table or a desk. Use the proper sized cuff. It should fit smoothly and snugly around your bare upper arm. There should be enough room to slip a fingertip under the cuff. The bottom edge of the cuff should be 1 inch above the crease of the elbow. Ideally, take 3 measurements at one sitting and record the average.     Lab Work: TODAY: BMET, mag If you have labs (blood work) drawn today and your tests are completely normal, you will receive your results only by: [employer] (if you have MyChart) OR A paper copy in the mail If you have any lab test that is abnormal or we need to change your treatment, we will call you to review the results.   Testing/Procedures: Your physician has requested that you have an echocardiogram. Echocardiography is a painless test that uses sound waves to create images of your heart. It provides your doctor with information about the size and shape of your heart and how well your heart's chambers and valves are working. This procedure takes approximately one hour. There are no restrictions for this procedure.  Your  physician has requested that you have a renal artery duplex. During this test, an ultrasound is used to evaluate blood flow to the kidneys. Allow one hour for this exam. Do not eat after midnight the day before and avoid carbonated beverages. Take your medications as you usually do.  Follow-Up: At [facility], you and your health needs are our priority.  As part of our continuing mission to provide you with exceptional heart care, we have created designated Provider Care Teams.  These Care Teams include your primary Cardiologist (physician) and Advanced Practice Providers (APPs -  Physician Assistants and Nurse Practitioners) who all work together to provide you with the care you need, when you need it.  We recommend signing up for the patient portal called "MyChart".  Sign up information is provided on this After Visit Summary.  MyChart is used to connect with patients for Virtual Visits (Telemedicine).  Patients are able to view lab/test results, encounter notes, upcoming appointments, etc.  Non-urgent messages can be sent to your provider as well.   To learn more about what you can do with MyChart, go to [email or web address].    Your next appointment:   6 week(s)  The format for your next appointment:   In Person  Provider:   Berniece Salines, DO     Other Instructions   Important Information About Sugar         Dispo:  Return in about 6 weeks (around 11/29/2021).   Medication Adjustments/Labs and Tests Ordered: Current medicines are reviewed at length with the patient today.  Concerns regarding medicines are outlined above.  Tests Ordered: Orders Placed This Encounter  Procedures   Basic Metabolic Panel (BMET)   Magnesium   EKG 12-Lead   ECHOCARDIOGRAM COMPLETE   VAS US RENAL ARTERY DUPLEX   Medication Changes: Meds ordered this encounter  Medications   valsartan (DIOVAN) 80  MG tablet    Sig: Take 1 tablet (80 mg total) by mouth daily.    Dispense:  90 tablet     Refill:  3

## 2021-10-18 NOTE — Patient Instructions (Addendum)
Medication Instructions:  Your physician has recommended you make the following change in your medication:  STOP: Enalapril (Vasotec)  START: Valsartan 80 mg once daily  Please take your blood pressure daily for 2 weeks and send in a MyChart message. Please include heart rates.   HOW TO TAKE YOUR BLOOD PRESSURE: Rest 5 minutes before taking your blood pressure. Don't smoke or drink caffeinated beverages for at least 30 minutes before. Take your blood pressure before (not after) you eat. Sit comfortably with your back supported and both feet on the floor (don't cross your legs). Elevate your arm to heart level on a table or a desk. Use the proper sized cuff. It should fit smoothly and snugly around your bare upper arm. There should be enough room to slip a fingertip under the cuff. The bottom edge of the cuff should be 1 inch above the crease of the elbow. Ideally, take 3 measurements at one sitting and record the average.     Lab Work: TODAY: BMET, mag If you have labs (blood work) drawn today and your tests are completely normal, you will receive your results only by: [employer] (if you have MyChart) OR A paper copy in the mail If you have any lab test that is abnormal or we need to change your treatment, we will call you to review the results.   Testing/Procedures: Your physician has requested that you have an echocardiogram. Echocardiography is a painless test that uses sound waves to create images of your heart. It provides your doctor with information about the size and shape of your heart and how well your heart's chambers and valves are working. This procedure takes approximately one hour. There are no restrictions for this procedure.  Your physician has requested that you have a renal artery duplex. During this test, an ultrasound is used to evaluate blood flow to the kidneys. Allow one hour for this exam. Do not eat after midnight the day before and avoid carbonated  beverages. Take your medications as you usually do.  Follow-Up: At [facility], you and your health needs are our priority.  As part of our continuing mission to provide you with exceptional heart care, we have created designated Provider Care Teams.  These Care Teams include your primary Cardiologist (physician) and Advanced Practice Providers (APPs -  Physician Assistants and Nurse Practitioners) who all work together to provide you with the care you need, when you need it.  We recommend signing up for the patient portal called "MyChart".  Sign up information is provided on this After Visit Summary.  MyChart is used to connect with patients for Virtual Visits (Telemedicine).  Patients are able to view lab/test results, encounter notes, upcoming appointments, etc.  Non-urgent messages can be sent to your provider as well.   To learn more about what you can do with MyChart, go to [email or web address].    Your next appointment:   6 week(s)  The format for your next appointment:   In Person  Provider:   Thomasene Ripple, DO     Other Instructions   Important Information About Sugar

## 2021-10-20 ENCOUNTER — Ambulatory Visit: Payer: Medicaid Other

## 2021-10-21 ENCOUNTER — Ambulatory Visit: Payer: Medicaid Other

## 2021-10-24 ENCOUNTER — Ambulatory Visit (HOSPITAL_COMMUNITY)
Admission: RE | Admit: 2021-10-24 | Discharge: 2021-10-24 | Disposition: A | Discharge status: Home / Self Care | Payer: Medicaid Other | Source: Ambulatory Visit | Attending: Internal Medicine | Admitting: Internal Medicine

## 2021-10-24 DIAGNOSIS — I701 Atherosclerosis of renal artery: Secondary | ICD-10-CM | POA: Diagnosis not present

## 2021-10-25 ENCOUNTER — Ambulatory Visit: Payer: Medicaid Other

## 2021-10-26 ENCOUNTER — Encounter: Payer: Self-pay | Admitting: Cardiology

## 2021-10-26 ENCOUNTER — Other Ambulatory Visit: Payer: Self-pay

## 2021-10-26 DIAGNOSIS — Z79899 Other long term (current) drug therapy: Secondary | ICD-10-CM

## 2021-10-26 DIAGNOSIS — I701 Atherosclerosis of renal artery: Secondary | ICD-10-CM

## 2021-10-27 ENCOUNTER — Ambulatory Visit (HOSPITAL_COMMUNITY): Payer: Medicaid Other | Attending: Cardiology

## 2021-10-27 DIAGNOSIS — I10 Essential (primary) hypertension: Secondary | ICD-10-CM

## 2021-10-27 DIAGNOSIS — O165 Unspecified maternal hypertension, complicating the puerperium: Secondary | ICD-10-CM | POA: Diagnosis not present

## 2021-10-27 LAB — ECHOCARDIOGRAM COMPLETE
Area-P 1/2: 4.07 cm2
P 1/2 time: 446 msec
S' Lateral: 3.2 cm

## 2021-10-28 ENCOUNTER — Ambulatory Visit (HOSPITAL_COMMUNITY): Payer: Medicaid Other

## 2021-11-11 ENCOUNTER — Encounter: Payer: Self-pay | Admitting: Physician Assistant

## 2021-11-11 ENCOUNTER — Telehealth (INDEPENDENT_AMBULATORY_CARE_PROVIDER_SITE_OTHER): Payer: Medicaid Other | Admitting: Physician Assistant

## 2021-11-11 ENCOUNTER — Ambulatory Visit: Payer: Medicaid Other | Admitting: Obstetrics and Gynecology

## 2021-11-11 DIAGNOSIS — O165 Unspecified maternal hypertension, complicating the puerperium: Secondary | ICD-10-CM

## 2021-11-11 DIAGNOSIS — F53 Postpartum depression: Secondary | ICD-10-CM

## 2021-11-11 DIAGNOSIS — G43009 Migraine without aura, not intractable, without status migrainosus: Secondary | ICD-10-CM | POA: Diagnosis not present

## 2021-11-11 DIAGNOSIS — O10919 Unspecified pre-existing hypertension complicating pregnancy, unspecified trimester: Secondary | ICD-10-CM | POA: Diagnosis not present

## 2021-11-11 MED ORDER — TOPIRAMATE 25 MG PO TABS: 75.0000 mg | ORAL_TABLET | Freq: Every day | ORAL | 3 refills | Status: DC

## 2021-11-11 MED ORDER — CYCLOBENZAPRINE HCL 10 MG PO TABS: 10.0000 mg | ORAL_TABLET | Freq: Three times a day (TID) | ORAL | 2 refills | Status: DC | PRN

## 2021-11-11 MED ORDER — NURTEC 75 MG PO TBDP: 75.0000 mg | ORAL_TABLET | ORAL | 3 refills | Status: DC | PRN

## 2021-11-11 MED ORDER — RIZATRIPTAN BENZOATE 10 MG PO TABS: 10.0000 mg | ORAL_TABLET | ORAL | 3 refills | Status: DC | PRN

## 2021-11-11 NOTE — Patient Instructions (Signed)
Begin Topiramate for prevention of migraine.  Begin with 1 tab of 25mg  at night.  Increase to 2 tabs at night after one week if well tolerated.  Increase to 3 tabs at night after the 2nd week and continue at this 75mg  dose each night until seen again in 3 months.  Call if you have side effects that are unacceptable. Will avoid triptans due to chronic hypertension.  Use Nurtec for acute treatment of migraine.  Sample available if you wish to pick up from the office. It melts on your tongue.  It should last for 2 days and so no need to repeat.  It generally causes zero side effects but there is a 3% chance of tummy pain. Use cyclobenzaprine to help with neck pain/headache.  Only use if another caretaker is able to be with baby and no driving due to possibility of sedation.   Also I recommend ice and/or heat for neck pain.  Use often!   It is okay to use fioricet for headache if needed for rescue.   Take a walk outside every day you can.  Call if you need [place].

## 2021-11-11 NOTE — Progress Notes (Signed)
Virtual to F/U on headaches states headaches still come and go.   Pt has recently delivered 08/09/21. Pt has not been able to come in office w/ new born.     TELEHEALTH HEADACHE VISIT ENCOUNTER NOTE  Provider location: [facility] [place] [facility]   Patient location: Home  I connected with Michele Gardner on 11/11/21 at 11:00 AM EDT by telephone and verified that I am speaking with the correct person using two identifiers. Patient was unable to do MyChart audiovisual encounter due to technical difficulties, she tried several times.    I discussed the limitations, risks, security and privacy concerns of performing an evaluation and management service by telephone and the availability of in person appointments. I also discussed with the patient that there may be a patient responsible charge related to this service. The patient expressed understanding and agreed to proceed.   History:  Michele Gardner is a 36 y.o. [phone] female being evaluated today for headache.  This is her first visit to discuss migraine when patient is not pregnant and not breastfeeding.  She has not tried any preventive medications other than periactin.  She is still having blood pressure concerns and seeing cardiology for this.  She notes worsening of headaches since the birth of her baby 3 months ago.  The quality of the pain is unchanged.  She has used fioricet and it helps but she's reluctant to use as it is a controlled substance.    She reports she is watching out for ppd (postpartum depression) but thinks she is doing okay overall.  She has dealt with this in the past.    Past Medical History:  Diagnosis Date   Asthma    Hypertension    Past Surgical History:  Procedure Laterality Date   WISDOM TOOTH EXTRACTION  2007   The following portions of the patient's history were reviewed and updated as appropriate: allergies, current medications, past family history, past medical history, past social  history, past surgical history and problem list.    Review of Systems:  Pertinent items noted in HPI and remainder of comprehensive ROS otherwise negative.  Physical Exam:   General:  Alert, oriented and cooperative.   Mental Status: Normal mood and affect perceived. Normal judgment and thought content.  Physical exam deferred due to nature of the encounter  Assessment and Plan:     1. Migraine without aura and without status migrainosus, not intractable   2. Chronic hypertension affecting pregnancy   3. Post partum depression   4. Postpartum hypertension    Pt will begin Topiramate for prevention of migraine.  Begin with 1 tab of 25mg  qhs.  Increase to 2 tabs qhs after one week if well tolerated.  Increase to 3 tabs qhs after the 2nd week and continue at this 75mg  dose each night until seen again in 3 months.   Will avoid triptans due to chronic hypertension.  Use Nurtec for acute treatment of migraine.  Sample available if pt wishes to pick up from the office.  Use cyclobenzaprine to help with neck pain/headache.  Only use if another caretaker is able to be with baby and no driving due to possibility of sedation.   Also advise for use of ice and/or heat for neck pain.  Use often!   Okay to use fioricet for HA if needed for rescue.   I have asked that sw check in with patient regarding possibility of postpartum depression.   RTC 3 months  to evaluate progress.  Call sooner if needed.        I discussed the assessment and treatment plan with the patient. The patient was provided an opportunity to ask questions and all were answered. The patient agreed with the plan and demonstrated an understanding of the instructions.   The patient was advised to call back or seek an in-person evaluation/go to the ED if the symptoms worsen or if the condition fails to improve as anticipated.  I provided 55 minutes of virtual/video time as well as time with chart review and formulating plan for this  encounter.   Bertram Denver, PA-C [facility] [employer], [facility]

## 2021-11-15 ENCOUNTER — Encounter: Payer: Medicaid Other | Admitting: Licensed Clinical Social Worker

## 2021-11-29 DIAGNOSIS — F4322 Adjustment disorder with anxiety: Secondary | ICD-10-CM | POA: Diagnosis not present

## 2021-11-30 ENCOUNTER — Ambulatory Visit: Payer: Medicaid Other | Admitting: Cardiology

## 2021-12-05 DIAGNOSIS — F4322 Adjustment disorder with anxiety: Secondary | ICD-10-CM | POA: Diagnosis not present

## 2021-12-15 DIAGNOSIS — F4322 Adjustment disorder with anxiety: Secondary | ICD-10-CM | POA: Diagnosis not present

## 2021-12-20 DIAGNOSIS — F4322 Adjustment disorder with anxiety: Secondary | ICD-10-CM | POA: Diagnosis not present

## 2022-01-03 ENCOUNTER — Ambulatory Visit: Payer: Medicaid Other | Admitting: Cardiology

## 2022-01-03 DIAGNOSIS — F4322 Adjustment disorder with anxiety: Secondary | ICD-10-CM | POA: Diagnosis not present

## 2022-01-10 DIAGNOSIS — F4322 Adjustment disorder with anxiety: Secondary | ICD-10-CM | POA: Diagnosis not present

## 2022-01-12 ENCOUNTER — Encounter: Payer: Self-pay | Admitting: Cardiology

## 2022-01-12 ENCOUNTER — Ambulatory Visit: Payer: Medicaid Other | Attending: Cardiology | Admitting: Cardiology

## 2022-01-12 VITALS — BP 142/94 | HR 79 | Ht 66.0 in | Wt 199.0 lb

## 2022-01-12 DIAGNOSIS — I10 Essential (primary) hypertension: Secondary | ICD-10-CM

## 2022-01-12 DIAGNOSIS — E669 Obesity, unspecified: Secondary | ICD-10-CM

## 2022-01-12 DIAGNOSIS — O165 Unspecified maternal hypertension, complicating the puerperium: Secondary | ICD-10-CM | POA: Diagnosis not present

## 2022-01-12 DIAGNOSIS — Z79899 Other long term (current) drug therapy: Secondary | ICD-10-CM | POA: Diagnosis not present

## 2022-01-12 LAB — BASIC METABOLIC PANEL
BUN/Creatinine Ratio: 20 (ref 9–23)
BUN: 15 mg/dL (ref 6–20)
CO2: 25 mmol/L (ref 20–29)
Calcium: 9.5 mg/dL (ref 8.7–10.2)
Chloride: 107 mmol/L — ABNORMAL HIGH (ref 96–106)
Creatinine, Ser: 0.76 mg/dL (ref 0.57–1.00)
Glucose: 71 mg/dL (ref 70–99)
Potassium: 4.2 mmol/L (ref 3.5–5.2)
Sodium: 143 mmol/L (ref 134–144)
eGFR: 105 mL/min/{1.73_m2} (ref >59)

## 2022-01-12 LAB — MAGNESIUM: Magnesium: 1.9 mg/dL (ref 1.6–2.3)

## 2022-01-12 MED ORDER — POTASSIUM CHLORIDE CRYS ER 20 MEQ PO TBCR: 20.0000 meq | EXTENDED_RELEASE_TABLET | Freq: Every day | ORAL | 0 refills | Status: DC

## 2022-01-12 MED ORDER — FUROSEMIDE 40 MG PO TABS: 40.0000 mg | ORAL_TABLET | Freq: Every day | ORAL | 0 refills | Status: DC

## 2022-01-12 MED ORDER — VALSARTAN 160 MG PO TABS: 160.0000 mg | ORAL_TABLET | Freq: Every day | ORAL | 3 refills | Status: DC

## 2022-01-12 NOTE — Patient Instructions (Signed)
Medication Instructions:  Your physician has recommended you make the following change in your medication:  INCREASE: Valsartan 160mg  daily  START: Lasix 40mg  dailly for the next 5 days  START: Potassium 63meq's for the next 5 days Please take the lasix when you take the potassium.  *If you need a refill on your cardiac medications before your next appointment, please call your pharmacy*  Please take your blood pressure daily for 1 week and send in a MyChart message. Please include heart rates.   HOW TO TAKE YOUR BLOOD PRESSURE: Rest 5 minutes before taking your blood pressure. Don't smoke or drink caffeinated beverages for at least 30 minutes before. Take your blood pressure before (not after) you eat. Sit comfortably with your back supported and both feet on the floor (don't cross your legs). Elevate your arm to heart level on a table or a desk. Use the proper sized cuff. It should fit smoothly and snugly around your bare upper arm. There should be enough room to slip a fingertip under the cuff. The bottom edge of the cuff should be 1 inch above the crease of the elbow. Ideally, take 3 measurements at one sitting and record the average.    Lab Work: Your physician recommends that you have the following labs drawn today: BMET and Magnesium.  If you have labs (blood work) drawn today and your tests are completely normal, you will receive your results only by: MyChart Message (if you have MyChart) OR A paper copy in the mail If you have any lab test that is abnormal or we need to change your treatment, we will call you to review the results.   Testing/Procedures: NONE   Follow-Up: At [facility], you and your health needs are our priority.  As part of our continuing mission to provide you with exceptional heart care, we have created designated Provider Care Teams.  These Care Teams include your primary Cardiologist (physician) and Advanced Practice Providers (APPs -   Physician Assistants and Nurse Practitioners) who all work together to provide you with the care you need, when you need it.  We recommend signing up for the patient portal called "MyChart".  Sign up information is provided on this After Visit Summary.  MyChart is used to connect with patients for Virtual Visits (Telemedicine).  Patients are able to view lab/test results, encounter notes, upcoming appointments, etc.  Non-urgent messages can be sent to your provider as well.   To learn more about what you can do with MyChart, go to 30m.    Your next appointment:   12 week(s)  The format for your next appointment:   In Person  Provider:   [facility], DO

## 2022-01-12 NOTE — Progress Notes (Signed)
Cardio-Obstetrics Clinic  New Evaluation  Date:  01/12/2022   ID:  Michele Gardner, DOB 1985/03/20, MRN [id number]  PCP:  Arlyce Dice, [place] [facility] Cardiologist:  Berniece Salines, DO  Electrophysiologist:  None       Referring MD: Arlyce Dice, MD   Chief Complaint: " I am   History of Present Illness:    Michele Gardner is a 36 y.o. female [[id number]] who is being seen today for the evaluation of postpartum hypertension at the request of Dr. Lynnda Shields.  Medical history includes chronic hypertension with accelerated postpartum hypertension and asthma.   I saw the patient postpartum in September 2023 at that time she was hypertensive she has been started on hydrochlorothiazide triamterene as well as enalapril, stop lisinopril and started patient on valsartan 80 mg daily.  She was not breast-feeding.  Since her visit she has been doing well.  She has ran out of the triamterene hydrochlorothiazide.  She tells me she is experiencing some leg swelling.  Prior CV Studies Reviewed: The following studies were reviewed today: None today   Past Medical History:  Diagnosis Date   Asthma    Hypertension     Past Surgical History:  Procedure Laterality Date   WISDOM TOOTH EXTRACTION  2007      OB History     Gravida  4   Para  3   Term  2   Preterm  1   AB  1   Living  3      SAB  0   IAB  1   Ectopic  0   Multiple  0   Live Births  3               Current Medications: Current Meds  Medication Sig   Blood Pressure Monitoring (BLOOD PRESSURE KIT) DEVI 1 kit by Does not apply route once a week.   Butalbital-APAP-Caffeine 50-325-40 MG capsule Take 1-2 capsules by mouth every 6 (six) hours as needed for headache.   cyclobenzaprine (FLEXERIL) 10 MG tablet Take 1 tablet (10 mg total) by mouth 3 (three) times daily as needed for muscle spasms (headache).   furosemide (LASIX) 40 MG tablet Take 1 tablet (40 mg total) by mouth daily.  For the next 5 days.   ibuprofen (ADVIL) 600 MG tablet Take 1 tablet (600 mg total) by mouth every 6 (six) hours.   potassium chloride SA (KLOR-CON M) 20 MEQ tablet Take 1 tablet (20 mEq total) by mouth daily. For the next 5 days   Prenatal Vit-Fe Fumarate-FA (PRENATAL MULTIVITAMIN) TABS tablet Take 1 tablet by mouth daily at 12 noon.   Rimegepant Sulfate (NURTEC) 75 MG TBDP Take 75 mg by mouth as needed.   topiramate (TOPAMAX) 25 MG tablet Take 3 tablets (75 mg total) by mouth daily.   triamterene-hydrochlorothiazide (MAXZIDE-25) 37.5-25 MG tablet Take 1 tablet by mouth daily.   [DISCONTINUED] valsartan (DIOVAN) 80 MG tablet Take 1 tablet (80 mg total) by mouth daily.     Allergies:   Bee venom   Social History   Socioeconomic History   Marital status: Single    Spouse name: Not on file   Number of children: Not on file   Years of education: Not on file   Highest education level: Not on file  Occupational History   Not on file  Tobacco Use   Smoking status: Former    Packs/day: 0.50    Types: Cigarettes  Quit date: 04/23/2015    Years since quitting: 6.7   Smokeless tobacco: Never  Vaping Use   Vaping Use: Never used  Substance and Sexual Activity   Alcohol use: No   Drug use: No   Sexual activity: Yes    Partners: Male    Birth control/protection: None  Other Topics Concern   Not on file  Social History Narrative   Not on file   Social Determinants of Health   Financial Resource Strain: Not on file  Food Insecurity: Not on file  Transportation Needs: Not on file  Physical Activity: Not on file  Stress: Not on file  Social Connections: Not on file      Family History  Problem Relation Age of Onset   Hypertension Mother    Hyperlipidemia Mother    Varicose Veins Father    Melanoma Father    Alcohol abuse Father    Cancer Maternal Grandfather    Diabetes Paternal Grandmother    Cancer Paternal Grandmother    Diabetes Paternal Grandfather    Brain cancer  Other       ROS:   Please see the history of present illness.     All other systems reviewed and are negative.   Labs/EKG Reviewed:    EKG:   None today  Recent Labs: 06/03/2021: TSH 0.528 08/09/2021: ALT 14; Hemoglobin 11.6; Platelets 224 10/18/2021: BUN 8; Creatinine, Ser 0.86; Magnesium 1.9; Potassium 3.9; Sodium 140   Recent Lipid Panel No results found for: "CHOL", "TRIG", "HDL", "CHOLHDL", "LDLCALC", "LDLDIRECT"  Physical Exam:    VS:  BP (!) 142/94 (BP Location: Right Arm, Patient Position: Sitting)   Pulse 79   Ht _0  (1.676 m)   Wt 199 lb (90.3 kg)   SpO2 98%   BMI 32.12 kg/m     Wt Readings from Last 3 Encounters:  01/12/22 199 lb (90.3 kg)  10/18/21 198 lb 12.8 oz (90.2 kg)  10/11/21 195 lb (88.5 kg)     GEN:  Well nourished, well developed in no acute distress HEENT: Normal NECK: No JVD; No carotid bruits LYMPHATICS: No lymphadenopathy CARDIAC: RRR, no murmurs, rubs, gallops RESPIRATORY:  Clear to auscultation without rales, wheezing or rhonchi  ABDOMEN: Soft, non-tender, non-distended MUSCULOSKELETAL:  No edema; No deformity  SKIN: Warm and dry NEUROLOGIC:  Alert and oriented x 3 PSYCHIATRIC:  Normal affect    Risk Assessment/Risk Calculators:                 ASSESSMENT & PLAN:    Postpartum accelerated hypertension Obesity Leg swelling  She has ran out of the hydrochlorothiazide triamterene has not been taking this medication.  She is only been taking valsartan 80 mg daily. In the office today manually for me she is 142/92 mmHg.  I would like to increase her valsartan to 160 mg daily.  Given her leg swelling we will give Lasix 40 mg daily x5 doses as well as potassium supplement. We will hold off on adding back to triamterene.  If she does not have any improvement on the valsartan then we will add this medication. She will take her blood pressure and send me that in 1 week.   I have asked the patient to take her blood pressure daily  and send me information in 1 weeks via MyChart to review and adjust medications if appropriate.  The patient understands the need to lose weight with diet and exercise. We have discussed specific strategies for this.  We  will follow the patient closely see her in 6 weeks.  Patient Instructions  Medication Instructions:  Your physician has recommended you make the following change in your medication:  INCREASE: Valsartan 147m daily  START: Lasix 424mdailly for the next 5 days  START: Potassium 2039ms for the next 5 days Please take the lasix when you take the potassium.  *If you need a refill on your cardiac medications before your next appointment, please call your pharmacy*  Please take your blood pressure daily for 1 week and send in a MyChart message. Please include heart rates.   HOW TO TAKE YOUR BLOOD PRESSURE: Rest 5 minutes before taking your blood pressure. Don't smoke or drink caffeinated beverages for at least 30 minutes before. Take your blood pressure before (not after) you eat. Sit comfortably with your back supported and both feet on the floor (don't cross your legs). Elevate your arm to heart level on a table or a desk. Use the proper sized cuff. It should fit smoothly and snugly around your bare upper arm. There should be enough room to slip a fingertip under the cuff. The bottom edge of the cuff should be 1 inch above the crease of the elbow. Ideally, take 3 measurements at one sitting and record the average.    Lab Work: Your physician recommends that you have the following labs drawn today: BMET and Magnesium.  If you have labs (blood work) drawn today and your tests are completely normal, you will receive your results only by: MyCLaurelf you have MyChart) OR A paper copy in the mail If you have any lab test that is abnormal or we need to change your treatment, we will call you to review the results.   Testing/Procedures: NONE   Follow-Up: [facility] and your [facility] needs are our priority.  As part of our continuing mission to provide you with exceptional heart care, we have created designated Provider Care Teams.  These Care Teams include your primary Cardiologist (physician) and Advanced Practice Providers (APPs -  Physician Assistants and Nurse Practitioners) who all work together to provide you with the care you need, when you need it.  We recommend signing up for the patient portal called "MyChart".  Sign up information is provided on this After Visit Summary.  MyChart is used to connect with patients for Virtual Visits (Telemedicine).  Patients are able to view lab/test results, encounter notes, upcoming appointments, etc.  Non-urgent messages can be sent to your provider as well.   To learn more about what you can do with MyChart, go to [email or web address]  Your next appointment:   12 week(s)  The format for your next appointment:   In Person  Provider:   KarBerniece SalinesO     Dispo:  Return in about 12 weeks (around 04/06/2022).   Medication Adjustments/Labs and Tests Ordered: Current medicines are reviewed at length with the patient today.  Concerns regarding medicines are outlined above.  Tests Ordered: Orders Placed This Encounter  Procedures   Basic Metabolic Panel (BMET)   Magnesium   Medication Changes: Meds ordered this encounter  Medications   valsartan (DIOVAN) 160 MG tablet    Sig: Take 1 tablet (160 mg total) by mouth daily.    Dispense:  90 tablet    Refill:  3   furosemide (LASIX) 40 MG tablet    Sig: Take 1 tablet (40 mg total) by mouth daily. For the next 5 days.  Dispense:  5 tablet    Refill:  0   potassium chloride SA (KLOR-CON M) 20 MEQ tablet    Sig: Take 1 tablet (20 mEq total) by mouth daily. For the next 5 days    Dispense:  5 tablet    Refill:  0

## 2022-01-24 DIAGNOSIS — F4322 Adjustment disorder with anxiety: Secondary | ICD-10-CM | POA: Diagnosis not present

## 2022-02-02 DIAGNOSIS — F4322 Adjustment disorder with anxiety: Secondary | ICD-10-CM | POA: Diagnosis not present

## 2022-02-07 DIAGNOSIS — F4322 Adjustment disorder with anxiety: Secondary | ICD-10-CM | POA: Diagnosis not present

## 2022-02-22 DIAGNOSIS — F4322 Adjustment disorder with anxiety: Secondary | ICD-10-CM | POA: Diagnosis not present

## 2022-03-01 DIAGNOSIS — F4322 Adjustment disorder with anxiety: Secondary | ICD-10-CM | POA: Diagnosis not present

## 2022-03-07 DIAGNOSIS — F4322 Adjustment disorder with anxiety: Secondary | ICD-10-CM | POA: Diagnosis not present

## 2022-03-07 NOTE — Telephone Encounter (Signed)
No additional notes for this patient. 

## 2022-03-15 DIAGNOSIS — F4322 Adjustment disorder with anxiety: Secondary | ICD-10-CM | POA: Diagnosis not present

## 2022-03-22 DIAGNOSIS — F4322 Adjustment disorder with anxiety: Secondary | ICD-10-CM | POA: Diagnosis not present

## 2022-03-29 DIAGNOSIS — F4322 Adjustment disorder with anxiety: Secondary | ICD-10-CM | POA: Diagnosis not present

## 2022-04-04 ENCOUNTER — Ambulatory Visit: Payer: Medicaid Other | Admitting: Cardiology

## 2022-04-12 DIAGNOSIS — F4322 Adjustment disorder with anxiety: Secondary | ICD-10-CM | POA: Diagnosis not present

## 2022-04-19 DIAGNOSIS — F4322 Adjustment disorder with anxiety: Secondary | ICD-10-CM | POA: Diagnosis not present

## 2022-04-27 DIAGNOSIS — F4322 Adjustment disorder with anxiety: Secondary | ICD-10-CM | POA: Diagnosis not present

## 2022-05-10 DIAGNOSIS — F4322 Adjustment disorder with anxiety: Secondary | ICD-10-CM | POA: Diagnosis not present

## 2022-05-16 DIAGNOSIS — F4322 Adjustment disorder with anxiety: Secondary | ICD-10-CM | POA: Diagnosis not present

## 2022-05-23 NOTE — Progress Notes (Deleted)
Cardiology Clinic Note   Patient Name: Michele Gardner Date of Encounter: 05/23/2022  Primary Care Provider:  Lincoln BrighamZheng, Jacky, MD Primary Cardiologist:  Thomasene RippleKardie Tobb, DO  Patient Profile    37 year old patient with history of postpartum hypertension, diagnosed in September 2023. Echo 10/27/2021 revealed normal LVEF of 55%-60% with no valvular abnormalities, Renal artery ultrasound 10/24/2021 was normal  Past Medical History    Past Medical History:  Diagnosis Date   Asthma    Hypertension    Past Surgical History:  Procedure Laterality Date   WISDOM TOOTH EXTRACTION  2007    Allergies  Allergies  Allergen Reactions   Bee Venom Anaphylaxis    History of Present Illness   Mrs. Michele Gardner is a 37 year old female we are following for postpartum hypertension.  Last seen by Dr.Tobb on 01/12/2022 and had run out of her HCTZ.Triameterene.  Was hypertensive while taking only valsartan. Medications were adjusted by adding short course of lasix 40 mg daily for 5 doses, and potassium supplement, due to LEE.  She was not started back on HCTZ at that time. She was to keep track of BP at home and send message to Dr. Servando Salinaobb [facility]. BP at that visit was 142/94.   Home Medications    Current Outpatient Medications  Medication Sig Dispense Refill   Blood Pressure Monitoring (BLOOD PRESSURE KIT) DEVI 1 kit by Does not apply route once a week. 1 each 0   Butalbital-APAP-Caffeine 50-325-40 MG capsule Take 1-2 capsules by mouth every 6 (six) hours as needed for headache. 40 capsule 2   cyclobenzaprine (FLEXERIL) 10 MG tablet Take 1 tablet (10 mg total) by mouth 3 (three) times daily as needed for muscle spasms (headache). 30 tablet 2   furosemide (LASIX) 40 MG tablet Take 1 tablet (40 mg total) by mouth daily. For the next 5 days. 5 tablet 0   ibuprofen (ADVIL) 600 MG tablet Take 1 tablet (600 mg total) by mouth every 6 (six) hours. 30 tablet 0   potassium chloride SA (KLOR-CON M) 20 MEQ tablet Take 1  tablet (20 mEq total) by mouth daily. For the next 5 days 5 tablet 0   Prenatal Vit-Fe Fumarate-FA (PRENATAL MULTIVITAMIN) TABS tablet Take 1 tablet by mouth daily at 12 noon.     Rimegepant Sulfate (NURTEC) 75 MG TBDP Take 75 mg by mouth as needed. 16 tablet 3   topiramate (TOPAMAX) 25 MG tablet Take 3 tablets (75 mg total) by mouth daily. 90 tablet 3   triamterene-hydrochlorothiazide (MAXZIDE-25) 37.5-25 MG tablet Take 1 tablet by mouth daily. 30 tablet 1   valsartan (DIOVAN) 160 MG tablet Take 1 tablet (160 mg total) by mouth daily. 90 tablet 3   No current facility-administered medications for this visit.     Family History    Family History  Problem Relation Age of Onset   Hypertension Mother    Hyperlipidemia Mother    Varicose Veins Father    Melanoma Father    Alcohol abuse Father    Cancer Maternal Grandfather    Diabetes Paternal Grandmother    Cancer Paternal Grandmother    Diabetes Paternal Grandfather    Brain cancer Other    She indicated that her mother is alive. She indicated that her father is alive. She indicated that her maternal grandfather is deceased. She indicated that her paternal grandmother is deceased. She indicated that her paternal grandfather is deceased. She indicated that the status of her other is unknown.  Social History    Social History   Socioeconomic History   Marital status: Single    Spouse name: Not on file   Number of children: Not on file   Years of education: Not on file   Highest education level: Not on file  Occupational History   Not on file  Tobacco Use   Smoking status: Former    Packs/day: .5    Types: Cigarettes    Quit date: 04/23/2015    Years since quitting: 7.0   Smokeless tobacco: Never  Vaping Use   Vaping Use: Never used  Substance and Sexual Activity   Alcohol use: No   Drug use: No   Sexual activity: Yes    Partners: Male    Birth control/protection: None  Other Topics Concern   Not on file  Social  History Narrative   Not on file   Social Determinants of Health   Financial Resource Strain: Not on file  Food Insecurity: Not on file  Transportation Needs: Not on file  Physical Activity: Not on file  Stress: Not on file  Social Connections: Not on file  Intimate Partner Violence: Not on file     Review of Systems    General:  No chills, fever, night sweats or weight changes.  Cardiovascular:  No chest pain, dyspnea on exertion, edema, orthopnea, palpitations, paroxysmal nocturnal dyspnea. Dermatological: No rash, lesions/masses Respiratory: No cough, dyspnea Urologic: No hematuria, dysuria Abdominal:   No nausea, vomiting, diarrhea, bright red blood per rectum, melena, or hematemesis Neurologic:  No visual changes, wkns, changes in mental status. All other systems reviewed and are otherwise negative except as noted above.     Physical Exam    VS:  There were no vitals taken for this visit. , BMI There is no height or weight on file to calculate BMI.     GEN: Well nourished, well developed, in no acute distress. HEENT: normal. Neck: Supple, no JVD, carotid bruits, or masses. Cardiac: RRR, no murmurs, rubs, or gallops. No clubbing, cyanosis, edema.  Radials/DP/PT 2+ and equal bilaterally.  Respiratory:  Respirations regular and unlabored, clear to auscultation bilaterally. GI: Soft, nontender, nondistended, BS + x 4. MS: no deformity or atrophy. Skin: warm and dry, no rash. Neuro:  Strength and sensation are intact. Psych: Normal affect.  Accessory Clinical Findings    ECG personally reviewed by me today- *** - No acute changes  Lab Results  Component Value Date   WBC 14.8 (H) 08/09/2021   HGB 11.6 (L) 08/09/2021   HCT 35.6 (L) 08/09/2021   MCV 87.0 08/09/2021   PLT 224 08/09/2021   Lab Results  Component Value Date   CREATININE 0.76 01/12/2022   BUN 15 01/12/2022   NA 143 01/12/2022   K 4.2 01/12/2022   CL 107 (H) 01/12/2022   CO2 25 01/12/2022   Lab  Results  Component Value Date   ALT 14 08/09/2021   AST 26 08/09/2021   ALKPHOS 69 08/09/2021   BILITOT 0.5 08/09/2021   No results found for: "CHOL", "HDL", "LDLCALC", "LDLDIRECT", "TRIG", "CHOLHDL"  Lab Results  Component Value Date   HGBA1C 5.4 02/17/2021    Review of Prior Studies: Echocardiogram 10/27/2021 1. Left ventricular ejection fraction, by estimation, is 55 to 60%. The  left ventricle has normal function. The left ventricle has no regional  wall motion abnormalities. Left ventricular diastolic parameters were  normal. The average left ventricular  global longitudinal strain is -23.1 %. The global longitudinal  strain is  normal.   2. Right ventricular systolic function is normal. The right ventricular  size is normal.   3. The mitral valve is normal in structure. No evidence of mitral valve  regurgitation. No evidence of mitral stenosis.   4. The aortic valve is tricuspid. Aortic valve regurgitation is trivial.  No aortic stenosis is present.   5. The inferior vena cava is normal in size with greater than 50%  respiratory variability, suggesting right atrial pressure of 3 mmHg.   Renal artery ultrasound 10/24/2021 Renal:    Right: No evidence of right renal artery stenosis. RRV flow present.         Normal size right kidney. Normal right Resisitive Index.         Normal cortical thickness of right kidney.  Left:  1-59% stenosis of the left renal artery. LRV flow present.         Normal size of left kidney. Normal left Resistive Index.         Normal cortical thickness of the left kidney.  Mesenteric:  Normal Celiac artery and Superior Mesenteric artery findings.   Assessment & Plan   1.  ***     {Are you ordering a CV Procedure (e.g. stress test, cath, DCCV, TEE, etc)?   Press F2        :[id number]}   Signed, Bettey Mare. Liborio Nixon, ANP, AACC   05/23/2022 10:54 AM      Office [phone] Fax [phone]  Notice: This dictation was prepared with  Dragon dictation along with smaller phrase technology. Any transcriptional errors that result from this process are unintentional and may not be corrected upon review.

## 2022-05-24 ENCOUNTER — Other Ambulatory Visit: Payer: Self-pay

## 2022-05-24 ENCOUNTER — Observation Stay (HOSPITAL_BASED_OUTPATIENT_CLINIC_OR_DEPARTMENT_OTHER)
Admission: EM | Admit: 2022-05-24 | Discharge: 2022-05-26 | Disposition: A | Discharge status: Home / Self Care | Payer: Medicaid Other | Attending: Family Medicine | Admitting: Family Medicine

## 2022-05-24 ENCOUNTER — Emergency Department (HOSPITAL_BASED_OUTPATIENT_CLINIC_OR_DEPARTMENT_OTHER): Payer: Medicaid Other

## 2022-05-24 ENCOUNTER — Encounter (HOSPITAL_BASED_OUTPATIENT_CLINIC_OR_DEPARTMENT_OTHER): Payer: Self-pay | Admitting: Emergency Medicine

## 2022-05-24 DIAGNOSIS — J45909 Unspecified asthma, uncomplicated: Secondary | ICD-10-CM | POA: Insufficient documentation

## 2022-05-24 DIAGNOSIS — R300 Dysuria: Secondary | ICD-10-CM | POA: Diagnosis not present

## 2022-05-24 DIAGNOSIS — I10 Essential (primary) hypertension: Secondary | ICD-10-CM | POA: Diagnosis present

## 2022-05-24 DIAGNOSIS — E669 Obesity, unspecified: Secondary | ICD-10-CM | POA: Diagnosis not present

## 2022-05-24 DIAGNOSIS — Z87891 Personal history of nicotine dependence: Secondary | ICD-10-CM | POA: Diagnosis not present

## 2022-05-24 DIAGNOSIS — N3090 Cystitis, unspecified without hematuria: Secondary | ICD-10-CM | POA: Diagnosis not present

## 2022-05-24 DIAGNOSIS — F4322 Adjustment disorder with anxiety: Secondary | ICD-10-CM | POA: Diagnosis not present

## 2022-05-24 DIAGNOSIS — A419 Sepsis, unspecified organism: Secondary | ICD-10-CM | POA: Diagnosis not present

## 2022-05-24 DIAGNOSIS — N3091 Cystitis, unspecified with hematuria: Secondary | ICD-10-CM | POA: Diagnosis not present

## 2022-05-24 DIAGNOSIS — Z79899 Other long term (current) drug therapy: Secondary | ICD-10-CM | POA: Diagnosis not present

## 2022-05-24 DIAGNOSIS — Z6839 Body mass index (BMI) 39.0-39.9, adult: Secondary | ICD-10-CM | POA: Diagnosis not present

## 2022-05-24 DIAGNOSIS — N3289 Other specified disorders of bladder: Secondary | ICD-10-CM | POA: Diagnosis not present

## 2022-05-24 DIAGNOSIS — G43909 Migraine, unspecified, not intractable, without status migrainosus: Secondary | ICD-10-CM | POA: Insufficient documentation

## 2022-05-24 DIAGNOSIS — R319 Hematuria, unspecified: Secondary | ICD-10-CM | POA: Diagnosis present

## 2022-05-24 DIAGNOSIS — N390 Urinary tract infection, site not specified: Secondary | ICD-10-CM | POA: Diagnosis present

## 2022-05-24 HISTORY — DX: Migraine, unspecified, not intractable, without status migrainosus: G43.909

## 2022-05-24 LAB — CBC WITH DIFFERENTIAL/PLATELET
Abs Immature Granulocytes: 0.1 10*3/uL — ABNORMAL HIGH (ref 0.00–0.07)
Basophils Absolute: 0.1 10*3/uL (ref 0.0–0.1)
Basophils Relative: 0 %
Eosinophils Absolute: 0.1 10*3/uL (ref 0.0–0.5)
Eosinophils Relative: 1 %
HCT: 45.4 % (ref 36.0–46.0)
Hemoglobin: 15.4 g/dL — ABNORMAL HIGH (ref 12.0–15.0)
Immature Granulocytes: 0 %
Lymphocytes Relative: 6 %
Lymphs Abs: 1.4 10*3/uL (ref 0.7–4.0)
MCH: 29.9 pg (ref 26.0–34.0)
MCHC: 33.9 g/dL (ref 30.0–36.0)
MCV: 88.2 fL (ref 80.0–100.0)
Monocytes Absolute: 1.5 10*3/uL — ABNORMAL HIGH (ref 0.1–1.0)
Monocytes Relative: 6 %
Neutro Abs: 19.7 10*3/uL — ABNORMAL HIGH (ref 1.7–7.7)
Neutrophils Relative %: 87 %
Platelets: 372 10*3/uL (ref 150–400)
RBC: 5.15 MIL/uL — ABNORMAL HIGH (ref 3.87–5.11)
RDW: 12.4 % (ref 11.5–15.5)
WBC: 22.9 10*3/uL — ABNORMAL HIGH (ref 4.0–10.5)
nRBC: 0 % (ref 0.0–0.2)

## 2022-05-24 LAB — URINALYSIS, MICROSCOPIC (REFLEX)

## 2022-05-24 LAB — URINALYSIS, ROUTINE W REFLEX MICROSCOPIC

## 2022-05-24 LAB — PREGNANCY, URINE: Preg Test, Ur: NEGATIVE

## 2022-05-24 NOTE — ED Notes (Signed)
Pt reporting chest tightness 9/10 at this time reports that she is losing a lot of blood form her UTI and has a lot of chronic issues.

## 2022-05-24 NOTE — ED Triage Notes (Signed)
Pt w/ dysuria x today; now has some nausea as well

## 2022-05-25 DIAGNOSIS — A419 Sepsis, unspecified organism: Secondary | ICD-10-CM | POA: Diagnosis not present

## 2022-05-25 DIAGNOSIS — R319 Hematuria, unspecified: Secondary | ICD-10-CM | POA: Diagnosis present

## 2022-05-25 DIAGNOSIS — Z79899 Other long term (current) drug therapy: Secondary | ICD-10-CM | POA: Diagnosis not present

## 2022-05-25 DIAGNOSIS — I10 Essential (primary) hypertension: Secondary | ICD-10-CM | POA: Diagnosis not present

## 2022-05-25 DIAGNOSIS — J45909 Unspecified asthma, uncomplicated: Secondary | ICD-10-CM | POA: Diagnosis not present

## 2022-05-25 DIAGNOSIS — Z6839 Body mass index (BMI) 39.0-39.9, adult: Secondary | ICD-10-CM | POA: Diagnosis not present

## 2022-05-25 DIAGNOSIS — G43909 Migraine, unspecified, not intractable, without status migrainosus: Secondary | ICD-10-CM | POA: Diagnosis not present

## 2022-05-25 DIAGNOSIS — Z87891 Personal history of nicotine dependence: Secondary | ICD-10-CM | POA: Diagnosis not present

## 2022-05-25 DIAGNOSIS — N3091 Cystitis, unspecified with hematuria: Secondary | ICD-10-CM | POA: Diagnosis not present

## 2022-05-25 DIAGNOSIS — E669 Obesity, unspecified: Secondary | ICD-10-CM | POA: Diagnosis not present

## 2022-05-25 DIAGNOSIS — N3001 Acute cystitis with hematuria: Secondary | ICD-10-CM | POA: Diagnosis not present

## 2022-05-25 DIAGNOSIS — N390 Urinary tract infection, site not specified: Secondary | ICD-10-CM | POA: Diagnosis present

## 2022-05-25 LAB — BASIC METABOLIC PANEL WITH GFR
Anion gap: 9 (ref 5–15)
BUN: 8 mg/dL (ref 6–20)
CO2: 24 mmol/L (ref 22–32)
Calcium: 8.4 mg/dL — ABNORMAL LOW (ref 8.9–10.3)
Chloride: 105 mmol/L (ref 98–111)
Creatinine, Ser: 0.72 mg/dL (ref 0.44–1.00)
GFR, Estimated: >60 mL/min (ref >60)
Glucose, Bld: 118 mg/dL — ABNORMAL HIGH (ref 70–99)
Potassium: 3.6 mmol/L (ref 3.5–5.1)
Sodium: 138 mmol/L (ref 135–145)

## 2022-05-25 LAB — CBC
HCT: 43.2 % (ref 36.0–46.0)
Hemoglobin: 14.4 g/dL (ref 12.0–15.0)
MCH: 29.8 pg (ref 26.0–34.0)
MCHC: 33.3 g/dL (ref 30.0–36.0)
MCV: 89.4 fL (ref 80.0–100.0)
Platelets: 314 10*3/uL (ref 150–400)
RBC: 4.83 MIL/uL (ref 3.87–5.11)
RDW: 12.4 % (ref 11.5–15.5)
WBC: 14.3 10*3/uL — ABNORMAL HIGH (ref 4.0–10.5)
nRBC: 0 % (ref 0.0–0.2)

## 2022-05-25 LAB — COMPREHENSIVE METABOLIC PANEL WITH GFR
ALT: 20 U/L (ref 0–44)
AST: 23 U/L (ref 15–41)
Albumin: 4.6 g/dL (ref 3.5–5.0)
Alkaline Phosphatase: 95 U/L (ref 38–126)
Anion gap: 9 (ref 5–15)
BUN: 12 mg/dL (ref 6–20)
CO2: 24 mmol/L (ref 22–32)
Calcium: 8.9 mg/dL (ref 8.9–10.3)
Chloride: 102 mmol/L (ref 98–111)
Creatinine, Ser: 0.9 mg/dL (ref 0.44–1.00)
GFR, Estimated: >60 mL/min (ref >60)
Glucose, Bld: 107 mg/dL — ABNORMAL HIGH (ref 70–99)
Potassium: 3.7 mmol/L (ref 3.5–5.1)
Sodium: 135 mmol/L (ref 135–145)
Total Bilirubin: 0.8 mg/dL (ref 0.3–1.2)
Total Protein: 8.3 g/dL — ABNORMAL HIGH (ref 6.5–8.1)

## 2022-05-25 LAB — URINALYSIS, ROUTINE W REFLEX MICROSCOPIC

## 2022-05-25 LAB — LACTIC ACID, PLASMA: Lactic Acid, Venous: 0.9 mmol/L (ref 0.5–1.9)

## 2022-05-25 LAB — WET PREP, GENITAL
Sperm: NONE SEEN
Trich, Wet Prep: NONE SEEN
WBC, Wet Prep HPF POC: >=10 — AB (ref <10)
Yeast Wet Prep HPF POC: NONE SEEN

## 2022-05-25 LAB — URINALYSIS, MICROSCOPIC (REFLEX)

## 2022-05-25 MED ORDER — TRAZODONE HCL 50 MG PO TABS: 25.0000 mg | ORAL_TABLET | Freq: Every evening | ORAL | Status: DC | PRN

## 2022-05-25 MED ORDER — ENOXAPARIN SODIUM 40 MG/0.4ML IJ SOSY
40.0000 mg | PREFILLED_SYRINGE | Freq: Every day | INTRAMUSCULAR | Status: DC
  Administered 2022-05-25: 40 mg via SUBCUTANEOUS
  Filled 2022-05-25 (×2): qty 0.4

## 2022-05-25 MED ORDER — OXYCODONE HCL 5 MG PO TABS
5.0000 mg | ORAL_TABLET | ORAL | Status: DC | PRN
  Administered 2022-05-25: 5 mg via ORAL
  Filled 2022-05-25: qty 1

## 2022-05-25 MED ORDER — ONDANSETRON HCL 4 MG PO TABS: 4.0000 mg | ORAL_TABLET | Freq: Four times a day (QID) | ORAL | Status: DC | PRN

## 2022-05-25 MED ORDER — ONDANSETRON HCL 4 MG/2ML IJ SOLN: 4.0000 mg | Freq: Four times a day (QID) | INTRAMUSCULAR | Status: DC | PRN

## 2022-05-25 MED ORDER — SENNOSIDES-DOCUSATE SODIUM 8.6-50 MG PO TABS: 1.0000 | ORAL_TABLET | Freq: Every evening | ORAL | Status: DC | PRN

## 2022-05-25 MED ORDER — LACTATED RINGERS IV BOLUS: 1000.0000 mL | Freq: Once | INTRAVENOUS | Status: DC

## 2022-05-25 MED ORDER — IBUPROFEN 200 MG PO TABS: 600.0000 mg | ORAL_TABLET | Freq: Four times a day (QID) | ORAL | Status: DC | PRN

## 2022-05-25 MED ORDER — IRBESARTAN 150 MG PO TABS
150.0000 mg | ORAL_TABLET | Freq: Every day | ORAL | Status: DC
  Administered 2022-05-25 – 2022-05-26 (×2): 150 mg via ORAL
  Filled 2022-05-25 (×2): qty 1

## 2022-05-25 MED ORDER — CYCLOBENZAPRINE HCL 10 MG PO TABS: 10.0000 mg | ORAL_TABLET | Freq: Three times a day (TID) | ORAL | Status: DC | PRN

## 2022-05-25 MED ORDER — HYDRALAZINE HCL 20 MG/ML IJ SOLN: 5.0000 mg | Freq: Four times a day (QID) | INTRAMUSCULAR | Status: DC | PRN

## 2022-05-25 MED ORDER — ALBUTEROL SULFATE (2.5 MG/3ML) 0.083% IN NEBU: 2.5000 mg | INHALATION_SOLUTION | RESPIRATORY_TRACT | Status: DC | PRN

## 2022-05-25 MED ORDER — SODIUM CHLORIDE 0.9 % IV BOLUS
2000.0000 mL | Freq: Once | INTRAVENOUS | Status: AC
  Administered 2022-05-25: 2000 mL via INTRAVENOUS

## 2022-05-25 MED ORDER — PHENAZOPYRIDINE HCL 100 MG PO TABS
95.0000 mg | ORAL_TABLET | Freq: Once | ORAL | Status: AC
  Administered 2022-05-25: 100 mg via ORAL
  Filled 2022-05-25: qty 1

## 2022-05-25 MED ORDER — BUTALBITAL-APAP-CAFFEINE 50-325-40 MG PO TABS
1.0000 | ORAL_TABLET | Freq: Four times a day (QID) | ORAL | Status: DC | PRN
  Administered 2022-05-25: 1 via ORAL
  Filled 2022-05-25: qty 1

## 2022-05-25 MED ORDER — MORPHINE SULFATE (PF) 4 MG/ML IV SOLN
4.0000 mg | Freq: Once | INTRAVENOUS | Status: AC
  Administered 2022-05-25: 4 mg via INTRAVENOUS
  Filled 2022-05-25: qty 1

## 2022-05-25 MED ORDER — TOPIRAMATE 25 MG PO TABS
25.0000 mg | ORAL_TABLET | Freq: Three times a day (TID) | ORAL | Status: DC
  Administered 2022-05-25 – 2022-05-26 (×4): 25 mg via ORAL
  Filled 2022-05-25 (×4): qty 1

## 2022-05-25 MED ORDER — SODIUM CHLORIDE 0.9 % IV SOLN
1.0000 g | INTRAVENOUS | Status: DC
  Administered 2022-05-25: 1 g via INTRAVENOUS
  Filled 2022-05-25: qty 10

## 2022-05-25 MED ORDER — RIMEGEPANT SULFATE 75 MG PO TBDP: 75.0000 mg | ORAL_TABLET | ORAL | Status: DC | PRN

## 2022-05-25 MED ORDER — PRENATAL MULTIVITAMIN CH
1.0000 | ORAL_TABLET | Freq: Every day | ORAL | Status: DC
  Administered 2022-05-26: 1 via ORAL
  Filled 2022-05-25 (×2): qty 1

## 2022-05-25 MED ORDER — SODIUM CHLORIDE 0.9 % IV SOLN
1.0000 g | Freq: Once | INTRAVENOUS | Status: AC
  Administered 2022-05-25: 1 g via INTRAVENOUS
  Filled 2022-05-25: qty 10

## 2022-05-25 NOTE — Progress Notes (Signed)
  Transition of Care [facility]) Screening Note   Patient Details  Name: Michele Gardner Date of Birth: [birth year]   [facility] of [facility]) CM/SW Contact:    Lanier Clam, RN Phone Number: 05/25/2022, 12:30 PM    Transition of Care Department [facility]) has reviewed patient and no TOC needs have been identified at this time. We will continue to monitor patient advancement through interdisciplinary progression rounds. If new patient transition needs arise, please place a TOC consult.

## 2022-05-25 NOTE — ED Provider Notes (Signed)
[place] [facility] AT [facility] Provider Note   CSN: [id number] Arrival date & time: 05/24/22  2110     History Chief Complaint  Patient presents with   Dysuria    Michele Gardner is a 37 y.o. female with history of migraines, hypertension, renal artery stenosis presents the emergency room today for evaluation of hematuria and dysuria as well as some bilateral flank pain and nausea.  Patient reports he is also having some urinary urgency and frequency as well but denies any vaginal discharge or vaginal bleeding.  Patient reports that she may have been feeling some flank pain off-and-on over the past week, but whenever she went to the bathroom today at work she had dysuria towards the end of her stream and noticed some blood.  Since then she is been having significant increase in urgency and frequency.  She reports that she is urinating straight blood.  She reports some suprapubic abdominal cramping but mainly complains of pain to her bilateral flanks.  She denies any fevers, or any hematochezia, melena, diarrhea, constipation, or any vomiting.  No known drug allergies.     Dysuria Associated symptoms: abdominal pain and nausea   Associated symptoms: no fever, no vaginal discharge and no vomiting        Home Medications Prior to Admission medications   Medication Sig Start Date End Date Taking? Authorizing Provider  Blood Pressure Monitoring (BLOOD PRESSURE KIT) DEVI 1 kit by Does not apply route once a week. 01/24/21   Constant, Peggy, MD  Butalbital-APAP-Caffeine [id number] MG capsule Take 1-2 capsules by mouth every 6 (six) hours as needed for headache. 04/29/21   Glyn Ade, Scot Jun, PA-C  cyclobenzaprine (FLEXERIL) 10 MG tablet Take 1 tablet (10 mg total) by mouth 3 (three) times daily as needed for muscle spasms (headache). 11/11/21   Glyn Ade, Scot Jun, PA-C  furosemide (LASIX) 40 MG tablet Take 1 tablet (40 mg total) by mouth daily. For the next 5 days.  01/12/22   Tobb, Kardie, DO  ibuprofen (ADVIL) 600 MG tablet Take 1 tablet (600 mg total) by mouth every 6 (six) hours. 08/11/21   Reva Bores, MD  potassium chloride SA (KLOR-CON M) 20 MEQ tablet Take 1 tablet (20 mEq total) by mouth daily. For the next 5 days 01/12/22   Tobb, Kardie, DO  Prenatal Vit-Fe Fumarate-FA (PRENATAL MULTIVITAMIN) TABS tablet Take 1 tablet by mouth daily at 12 noon.    [provider]  Rimegepant Sulfate (NURTEC) 75 MG TBDP Take 75 mg by mouth as needed. 11/11/21   Glyn Ade, Scot Jun, PA-C  topiramate (TOPAMAX) 25 MG tablet Take 3 tablets (75 mg total) by mouth daily. 11/11/21   Teague Chestine Spore, Scot Jun, PA-C  triamterene-hydrochlorothiazide (MAXZIDE-25) 37.5-25 MG tablet Take 1 tablet by mouth daily. 09/08/21   Hermina Staggers, MD  valsartan (DIOVAN) 160 MG tablet Take 1 tablet (160 mg total) by mouth daily. 01/12/22   Tobb, Kardie, DO      Allergies    Bee venom    Review of Systems   Review of Systems  Constitutional:  Negative for chills and fever.  HENT:  Negative for congestion and rhinorrhea.   Respiratory:  Negative for shortness of breath.   Cardiovascular:  Negative for chest pain.  Gastrointestinal:  Positive for abdominal pain and nausea. Negative for constipation, diarrhea and vomiting.  Genitourinary:  Positive for dysuria, frequency, hematuria and urgency. Negative for vaginal bleeding and vaginal discharge.  Physical Exam Updated Vital Signs BP (!) 148/105   Pulse 99   Temp 98.4 F (36.9 C) (Oral)   Resp 18   Ht 5\' 6"  (1.676 m)   Wt 88.5 kg   LMP 04/26/2022   SpO2 99%   BMI 31.47 kg/m  Physical Exam Vitals and nursing note reviewed. Exam conducted with a chaperone present Clinton Gallant, RN).  Constitutional:      General: She is not in acute distress.    Appearance: She is not ill-appearing or toxic-appearing.  HENT:     Mouth/Throat:     Mouth: Mucous membranes are moist.  Eyes:     General: No scleral  icterus. Cardiovascular:     Rate and Rhythm: Tachycardia present.  Pulmonary:     Effort: Pulmonary effort is normal. No respiratory distress.     Breath sounds: Normal breath sounds.  Abdominal:     General: Bowel sounds are normal.     Palpations: Abdomen is soft.     Tenderness: There is abdominal tenderness. There is right CVA tenderness and left CVA tenderness. There is no guarding or rebound.     Comments: Very mild suprapubic tenderness palpation.  Soft, no guarding or rebound noted.  Bilateral CVA tenderness to palpation.  Normal active bowel sounds.  Genitourinary:    Urethra: No prolapse or urethral swelling.     Cervix: No cervical motion tenderness.     Comments: IUD strings are visible.  The patient does have some thin brownish discharge noted.  No CMT tenderness.  No lesions seen.  No significant bleeding seen from the cervical os. Musculoskeletal:     Cervical back: Normal range of motion.  Skin:    General: Skin is warm and dry.  Neurological:     Mental Status: She is alert.     ED Results / Procedures / Treatments   Labs (all labs ordered are listed, but only abnormal results are displayed) Labs Reviewed  WET PREP, GENITAL - Abnormal; Notable for the following components:      Result Value   Clue Cells Wet Prep HPF POC PRESENT (*)    WBC, Wet Prep HPF POC >=10 (*)    All other components within normal limits  URINALYSIS, ROUTINE W REFLEX MICROSCOPIC - Abnormal; Notable for the following components:   Color, Urine RED (*)    APPearance TURBID (*)    Glucose, UA   (*)    Value: TEST NOT REPORTED DUE TO COLOR INTERFERENCE OF URINE PIGMENT   Hgb urine dipstick   (*)    Value: TEST NOT REPORTED DUE TO COLOR INTERFERENCE OF URINE PIGMENT   Bilirubin Urine   (*)    Value: TEST NOT REPORTED DUE TO COLOR INTERFERENCE OF URINE PIGMENT   Ketones, ur   (*)    Value: TEST NOT REPORTED DUE TO COLOR INTERFERENCE OF URINE PIGMENT   Protein, ur   (*)    Value: TEST NOT  REPORTED DUE TO COLOR INTERFERENCE OF URINE PIGMENT   Nitrite   (*)    Value: TEST NOT REPORTED DUE TO COLOR INTERFERENCE OF URINE PIGMENT   Leukocytes,Ua   (*)    Value: TEST NOT REPORTED DUE TO COLOR INTERFERENCE OF URINE PIGMENT   All other components within normal limits  URINALYSIS, MICROSCOPIC (REFLEX) - Abnormal; Notable for the following components:   Bacteria, UA FEW (*)    All other components within normal limits  URINALYSIS, ROUTINE W REFLEX MICROSCOPIC - Abnormal; Notable for the  following components:   Color, Urine RED (*)    APPearance TURBID (*)    Glucose, UA   (*)    Value: TEST NOT REPORTED DUE TO COLOR INTERFERENCE OF URINE PIGMENT   Hgb urine dipstick   (*)    Value: TEST NOT REPORTED DUE TO COLOR INTERFERENCE OF URINE PIGMENT   Bilirubin Urine   (*)    Value: TEST NOT REPORTED DUE TO COLOR INTERFERENCE OF URINE PIGMENT   Ketones, ur   (*)    Value: TEST NOT REPORTED DUE TO COLOR INTERFERENCE OF URINE PIGMENT   Protein, ur   (*)    Value: TEST NOT REPORTED DUE TO COLOR INTERFERENCE OF URINE PIGMENT   Nitrite   (*)    Value: TEST NOT REPORTED DUE TO COLOR INTERFERENCE OF URINE PIGMENT   Leukocytes,Ua   (*)    Value: TEST NOT REPORTED DUE TO COLOR INTERFERENCE OF URINE PIGMENT   All other components within normal limits  CBC WITH DIFFERENTIAL/PLATELET - Abnormal; Notable for the following components:   WBC 22.9 (*)    RBC 5.15 (*)    Hemoglobin 15.4 (*)    Neutro Abs 19.7 (*)    Monocytes Absolute 1.5 (*)    Abs Immature Granulocytes 0.10 (*)    All other components within normal limits  COMPREHENSIVE METABOLIC PANEL - Abnormal; Notable for the following components:   Glucose, Bld 107 (*)    Total Protein 8.3 (*)    All other components within normal limits  URINALYSIS, MICROSCOPIC (REFLEX) - Abnormal; Notable for the following components:   Bacteria, UA FEW (*)    All other components within normal limits  URINE CULTURE  PREGNANCY, URINE  LACTIC ACID,  PLASMA  GC/CHLAMYDIA PROBE AMP ([place]) NOT AT [facility]    EKG None  Radiology CT Renal Stone Study  Result Date: 05/24/2022 CLINICAL DATA:  Flank pain and dysuria, initial encounter EXAM: CT ABDOMEN AND PELVIS WITHOUT CONTRAST TECHNIQUE: Multidetector CT imaging of the abdomen and pelvis was performed following the standard protocol without IV contrast. RADIATION DOSE REDUCTION: This exam was performed according to the departmental dose-optimization program which includes automated exposure control, adjustment of the mA and/or kV according to patient size and/or use of iterative reconstruction technique. COMPARISON:  None Available. FINDINGS: Lower chest: No acute abnormality. Hepatobiliary: No focal liver abnormality is seen. No gallstones, gallbladder wall thickening, or biliary dilatation. Pancreas: Unremarkable. No pancreatic ductal dilatation or surrounding inflammatory changes. Spleen: Normal in size without focal abnormality. Adrenals/Urinary Tract: Adrenal glands are within normal limits. Kidneys are well visualized bilaterally. No renal calculi or obstructive changes are noted. Ureters are within normal limits. Bladder is partially distended. Wall thickening is noted in part due to the incomplete distension although possibility of underlying cystitis deserve consideration given the clinical history. Stomach/Bowel: No obstructive or inflammatory changes of the colon are noted. Scattered diverticular changes seen without evidence of diverticulitis. The appendix is within normal limits. Small bowel and stomach are unremarkable. Vascular/Lymphatic: No significant vascular findings are present. No enlarged abdominal or pelvic lymph nodes. Reproductive: Uterus and bilateral adnexa are unremarkable. IUD is noted in place. Other: No abdominal wall hernia or abnormality. No abdominopelvic ascites. Musculoskeletal: No acute or significant osseous findings. IMPRESSION: Bladder wall thickening in part due  to incomplete distension although the possibility of underlying cystitis deserves consideration given the clinical history. No other focal abnormality is noted. Electronically Signed   By: Alcide Clever M.D.   On: 05/24/2022  23:57    Procedures Procedures   Medications Ordered in ED Medications  cefTRIAXone (ROCEPHIN) 1 g in sodium chloride 0.9 % 100 mL IVPB (1 g Intravenous New Bag/Given 05/25/22 0110)    ED Course/ Medical Decision Making/ A&P                          Medical Decision Making Amount and/or Complexity of Data Reviewed Labs: ordered. Radiology: ordered.  Risk Prescription drug management.   37 y.o. female presents to the ER for evaluation of dysuria and hematuria. Differential diagnosis includes but is not limited to UTI, nephrolithiasis, malignancy, nephritis. Vital signs elevated blood pressure at 140/105, borderline tachycardia otherwise afebrile, satting well on room air without increased work of breathing. Physical exam as noted above.   Will order CT renal given the patient's hematuria as well as her bilateral CVA tenderness.  I independently reviewed and interpreted the patient's labs.  Urinalysis shows red and turbid urine.  There is few bacteria with 11-20 white blood cells and 21-50 red blood cells seen with few bacteria.  White blood cells clumps are present however.  Urine culture is pending.  We did have to recollect the urine as the first 1 had multiple skin cells in it.  Recollect was for better urine culture.  CBC shows significant leukocytosis at 22.9 with a left shift.  Patient's hemoglobin is concentrated at 15.4.  Normal platelets.  CMP shows mildly increase glucose at 107.  Mildly increased total protein 8.3, otherwise, no electrolyte light or LFT abnormality.  Pregnancy test is negative.  Wet prep shows some clue cells as well as some white blood cells.  Patient not complaining about any vaginal discharge.  Lactic acid unremarkable at 0.9.  CT renal  shows bladder wall thickening in part due to incomplete distension although the possibility of underlying cystitis deserves consideration given the clinical history. No other focal abnormality is noted.   The patient reports a momentary amount of chest pain which she reports she thinks is because she was worried about how much blood she was losing. She reports that she feels fine now. Denies any SOB. EKG obtained and shows NSR. Likely anxiety and patient supports this.   Concern given the patient's significantly elevated white blood cell count at 23 is well as her urine looking very light heavy as well as her borderline tachycardia, qualifying her for SIRS criteria.  I did share decision-making the patient on admission versus discharge home.  Patient likely admitted which I think is extremely reasonable.  Will add on some Rocephin for treatment of pyelonephritis however none is seen on CT imaging but she does have some CVA tenderness.  Morphine ordered for pain.  Rocephin ordered for antibiotic coverage.  Azo ordered for pain relief as well.  Normal saline bolus ordered.  1:46 AM Care of MEKA LEWAN transferred to Dr. Preston Fleeting at the end of my shift as the patient will require reassessment once labs/imaging have resulted. Patient presentation, ED course, and plan of care discussed with review of all pertinent labs and imaging. Please see his/her note for further details regarding further ED course and disposition. Plan at time of handoff is admit patient to hospitalist service. This may be altered or completely changed at the discretion of the oncoming team pending results of further workup.  Portions of this report may have been transcribed using voice recognition software. Every effort was made to ensure accuracy; however, inadvertent computerized transcription  errors may be present.   Final Clinical Impression(s) / ED Diagnoses Final diagnoses:  Hemorrhagic cystitis    Rx / DC Orders ED Discharge  Orders     None         Achille RichRansom, Elexus Barman, PA-C 05/25/22 0149    Dione BoozeGlick, David, MD 05/25/22 [phone]

## 2022-05-25 NOTE — Plan of Care (Signed)

## 2022-05-25 NOTE — ED Notes (Signed)
[facility] at bedside for transport. 

## 2022-05-25 NOTE — ED Notes (Signed)
Report called to [place], Charity fundraiser for report

## 2022-05-25 NOTE — H&P (Signed)
History and Physical  Michele Gardner YBW:[id number] DOB: 1985/05/15 DOA: 05/24/2022  PCP: Lincoln Brigham, MD   Chief Complaint: flank pain and nausea   HPI: Michele Gardner is a 37 y.o. female with medical history significant for hypertension and migraines being admitted to the hospital with hemorrhagic cystitis.  She states she has a history of multiple UTIs even in the small bowel, with no known cause.  In any case, she was in her usual state of health until yesterday when she was at work in the afternoon started having frequent urination which she thought was abnormal.  At the time, had no nausea, pain with urination; but into the late afternoon started having gross hematuria, initially with some small clots, and then complete appearing urine.  She also started having some vague nausea, and bilateral low back discomfort reminiscent of previous UTIs.  She went to the [facility] for evaluation.  Denies any fevers, no vomiting, no chest pain.  ED Course: In the emergency department, she was noted to have some tachycardia, blood pressure elevated to 179/114.  Lab work revealed leukocytosis of 23,000, rest of her labs are relatively unremarkable.  She was given IV fluids, urine culture was obtained, she was started on IV Rocephin and admitted for observation.  Currently: She is resting comfortably, still having a little bit of low back pain but otherwise feels good.  Her urine is dark, but no longer bloody.  Review of Systems: Please see HPI for pertinent positives and negatives. A complete 10 system review of systems are otherwise negative.  Past Medical History:  Diagnosis Date   Asthma    Hypertension    Migraine    Past Surgical History:  Procedure Laterality Date   WISDOM TOOTH EXTRACTION  2007    Social History:  reports that she quit smoking about 7 years ago. Her smoking use included cigarettes. She smoked an average of .5 packs per day. She has never used smokeless tobacco. She  reports that she does not drink alcohol and does not use drugs.   Allergies  Allergen Reactions   Bee Venom Anaphylaxis    Family History  Problem Relation Age of Onset   Hypertension Mother    Hyperlipidemia Mother    Varicose Veins Father    Melanoma Father    Alcohol abuse Father    Cancer Maternal Grandfather    Diabetes Paternal Grandmother    Cancer Paternal Grandmother    Diabetes Paternal Grandfather    Brain cancer Other      Prior to Admission medications   Medication Sig Start Date End Date Taking? Authorizing Provider  Butalbital-APAP-Caffeine [id number] MG capsule Take 1-2 capsules by mouth every 6 (six) hours as needed for headache. 04/29/21  Yes Teague Chestine Spore, Scot Jun, PA-C  cyclobenzaprine (FLEXERIL) 10 MG tablet Take 1 tablet (10 mg total) by mouth 3 (three) times daily as needed for muscle spasms (headache). 11/11/21  Yes Teague Edwena Blow, PA-C  ibuprofen (ADVIL) 600 MG tablet Take 1 tablet (600 mg total) by mouth every 6 (six) hours. Patient taking differently: Take 600 mg by mouth every 6 (six) hours as needed for headache or moderate pain. 08/11/21  Yes Reva Bores, MD  Prenatal Vit-Fe Fumarate-FA (PRENATAL MULTIVITAMIN) TABS tablet Take 1 tablet by mouth daily.   Yes [provider]  Rimegepant Sulfate (NURTEC) 75 MG TBDP Take 75 mg by mouth as needed. Patient taking differently: Take 75 mg by mouth as needed (Migraine). 11/11/21  Yes Bertram Denver, PA-C  topiramate (TOPAMAX) 25 MG tablet Take 3 tablets (75 mg total) by mouth daily. Patient taking differently: Take 25 mg by mouth in the morning, at noon, and at bedtime. 11/11/21  Yes Teague Edwena Blow, PA-C  valsartan (DIOVAN) 160 MG tablet Take 1 tablet (160 mg total) by mouth daily. 01/12/22  Yes Tobb, Kardie, DO  Blood Pressure Monitoring (BLOOD PRESSURE KIT) DEVI 1 kit by Does not apply route once a week. 01/24/21   Constant, Peggy, MD  furosemide (LASIX) 40 MG tablet Take 1 tablet  (40 mg total) by mouth daily. For the next 5 days. Patient not taking: Reported on 05/25/2022 01/12/22   Tobb, Kardie, DO  potassium chloride SA (KLOR-CON M) 20 MEQ tablet Take 1 tablet (20 mEq total) by mouth daily. For the next 5 days Patient not taking: Reported on 05/25/2022 01/12/22   Thomasene Ripple, DO    Physical Exam: BP (!) 147/92 (BP Location: Left Arm)   Pulse 96   Temp 97.9 F (36.6 C) (Oral)   Resp 16   Ht 5\' 6"  (1.676 m)   Wt 88.5 kg   LMP 04/26/2022   SpO2 99%   BMI 31.47 kg/m   General:  Alert, oriented, calm, in no acute distress, looks tired but otherwise quite comfortable and nontoxic Eyes: EOMI, clear conjuctivae, white sclerea Neck: supple, no masses, trachea mildline  Cardiovascular: RRR, no murmurs or rubs, no peripheral edema  Respiratory: clear to auscultation bilaterally, no wheezes, no crackles  Abdomen: soft, nontender, nondistended, normal bowel tones heard  Skin: dry, no rashes  Musculoskeletal: no joint effusions, normal range of motion  Psychiatric: appropriate affect, normal speech  Neurologic: extraocular muscles intact, clear speech, moving all extremities with intact sensorium          Labs on Admission:  Basic Metabolic Panel: Recent Labs  Lab 05/24/22 2337  NA 135  K 3.7  CL 102  CO2 24  GLUCOSE 107*  BUN 12  CREATININE 0.90  CALCIUM 8.9   Liver Function Tests: Recent Labs  Lab 05/24/22 2337  AST 23  ALT 20  ALKPHOS 95  BILITOT 0.8  PROT 8.3*  ALBUMIN 4.6   No results for input(s): "LIPASE", "AMYLASE" in the last 168 hours. No results for input(s): "AMMONIA" in the last 168 hours. CBC: Recent Labs  Lab 05/24/22 2337 05/25/22 0842  WBC 22.9* 14.3*  NEUTROABS 19.7*  --   HGB 15.4* 14.4  HCT 45.4 43.2  MCV 88.2 89.4  PLT 372 314   Cardiac Enzymes: No results for input(s): "CKTOTAL", "CKMB", "CKMBINDEX", "TROPONINI" in the last 168 hours.  BNP (last 3 results) No results for input(s): "BNP" in the last 8760  hours.  ProBNP (last 3 results) No results for input(s): "PROBNP" in the last 8760 hours.  CBG: No results for input(s): "GLUCAP" in the last 168 hours.  Radiological Exams on Admission: CT Renal Stone Study  Result Date: 05/24/2022 CLINICAL DATA:  Flank pain and dysuria, initial encounter EXAM: CT ABDOMEN AND PELVIS WITHOUT CONTRAST TECHNIQUE: Multidetector CT imaging of the abdomen and pelvis was performed following the standard protocol without IV contrast. RADIATION DOSE REDUCTION: This exam was performed according to the departmental dose-optimization program which includes automated exposure control, adjustment of the mA and/or kV according to patient size and/or use of iterative reconstruction technique. COMPARISON:  None Available. FINDINGS: Lower chest: No acute abnormality. Hepatobiliary: No focal liver abnormality is seen. No gallstones, gallbladder wall thickening,  or biliary dilatation. Pancreas: Unremarkable. No pancreatic ductal dilatation or surrounding inflammatory changes. Spleen: Normal in size without focal abnormality. Adrenals/Urinary Tract: Adrenal glands are within normal limits. Kidneys are well visualized bilaterally. No renal calculi or obstructive changes are noted. Ureters are within normal limits. Bladder is partially distended. Wall thickening is noted in part due to the incomplete distension although possibility of underlying cystitis deserve consideration given the clinical history. Stomach/Bowel: No obstructive or inflammatory changes of the colon are noted. Scattered diverticular changes seen without evidence of diverticulitis. The appendix is within normal limits. Small bowel and stomach are unremarkable. Vascular/Lymphatic: No significant vascular findings are present. No enlarged abdominal or pelvic lymph nodes. Reproductive: Uterus and bilateral adnexa are unremarkable. IUD is noted in place. Other: No abdominal wall hernia or abnormality. No abdominopelvic ascites.  Musculoskeletal: No acute or significant osseous findings. IMPRESSION: Bladder wall thickening in part due to incomplete distension although the possibility of underlying cystitis deserves consideration given the clinical history. No other focal abnormality is noted. Electronically Signed   By: Alcide CleverMark  Lukens M.D.   On: 05/24/2022 23:57    Assessment/Plan Principal Problem: Sepsis from hemorrhagic UTI (urinary tract infection) -meeting criteria with leukocytosis, tachycardia, source is UTI.  Normal lactate.  Leukocytosis started improving significantly. -Observation admission -Follow urine culture -Continue empiric IV Rocephin Active Problems:   Benign essential hypertension-continue ARB   Obesity (BMI 30-39.9) Migraine-as needed meds  DVT prophylaxis: Lovenox     Code Status: Full Code  Consults called: None  Admission status: Observation, anticipate patient can be discharged home in the morning  Time spent: 35 minutes  Araly Kaas Sharlette DenseM Krystena Reitter MD [facility] Pager [phone]  If 7PM-7AM, please contact night-coverage [email or web address] Password [email or web address]  05/25/2022, 9:42 AM

## 2022-05-26 ENCOUNTER — Ambulatory Visit: Payer: Medicaid Other | Admitting: Adult Health

## 2022-05-26 DIAGNOSIS — N3001 Acute cystitis with hematuria: Secondary | ICD-10-CM | POA: Diagnosis not present

## 2022-05-26 LAB — BASIC METABOLIC PANEL
Anion gap: 8 (ref 5–15)
BUN: 11 mg/dL (ref 6–20)
CO2: 24 mmol/L (ref 22–32)
Calcium: 8.7 mg/dL — ABNORMAL LOW (ref 8.9–10.3)
Chloride: 107 mmol/L (ref 98–111)
Creatinine, Ser: 0.82 mg/dL (ref 0.44–1.00)
GFR, Estimated: >60 mL/min (ref >60)
Glucose, Bld: 99 mg/dL (ref 70–99)
Potassium: 3.7 mmol/L (ref 3.5–5.1)
Sodium: 139 mmol/L (ref 135–145)

## 2022-05-26 LAB — CBC
HCT: 42.2 % (ref 36.0–46.0)
Hemoglobin: 14 g/dL (ref 12.0–15.0)
MCH: 29.5 pg (ref 26.0–34.0)
MCHC: 33.2 g/dL (ref 30.0–36.0)
MCV: 88.8 fL (ref 80.0–100.0)
Platelets: 284 10*3/uL (ref 150–400)
RBC: 4.75 MIL/uL (ref 3.87–5.11)
RDW: 12.3 % (ref 11.5–15.5)
WBC: 8.1 10*3/uL (ref 4.0–10.5)
nRBC: 0 % (ref 0.0–0.2)

## 2022-05-26 LAB — GC/CHLAMYDIA PROBE AMP (~~LOC~~) NOT AT ARMC
Chlamydia: NEGATIVE
Comment: NEGATIVE
Comment: NORMAL
Neisseria Gonorrhea: NEGATIVE

## 2022-05-26 LAB — URINE CULTURE: Culture: <10000 — AB

## 2022-05-26 MED ORDER — CEFADROXIL 500 MG PO CAPS: 500.0000 mg | ORAL_CAPSULE | Freq: Two times a day (BID) | ORAL | 0 refills | Status: AC

## 2022-05-26 NOTE — Discharge Summary (Signed)
Physician Discharge Summary   Patient: Michele Gardner MRN: [id number] DOB: 1985-08-13  Admit date:     05/24/2022  Discharge date: 05/26/22  Discharge Physician: Jacquelin Hawking, MD   PCP: Lincoln Brigham, MD   Recommendations at discharge:  PCP follow-up  Discharge Diagnoses: Principal Problem:   UTI (urinary tract infection) Active Problems:   Benign essential hypertension   Obesity (BMI 30-39.9)  Resolved Problems:   * No resolved hospital problems. *  Hospital Course: Michele Gardner is a 37 y.o. female with a history of hypertension and migraines with evidence of hemorrhagic cystitis on admission. No evidence of pyelonephritis on imaging. Empiric Ceftriaxone started and urine culture obtained. Insignificant growth on urine culture. Patient with quick improvement of symptoms and was transitioned to Cefadroxil on discharge.  Assessment and Plan:  Sepsis Patient met criteria on admission, however no fevers. No blood cultures obtained and patient quickly improved with Ceftriaxone. WBC resolved. Remained afebrile. Patient transitioned to Cefadroxil for discharge.   Hemorrhagic UTI Noted on admission. Urine culture without significant growth. Patient managed on Ceftriaxone while inpatient and transitioned to Cefadroxil on discharge.   Primary hypertension Patient is on valsartan as an outpatient. Continue on discharge.   Migraines Continue Flexeril Topamax and Nurtec.  Clue cells on wet prep Likely bacterial vaginosis. No symptoms. No treatment.   Obesity Estimated body mass index is 31.47 kg/m as calculated from the following:   Height as of this encounter:  (1.676 m).   Weight as of this encounter: 88.5 kg.   Consultants: None Procedures performed: None  Disposition: Home Diet recommendation: Regular diet   DISCHARGE MEDICATION: Allergies as of 05/26/2022       Reactions   Bee Venom Anaphylaxis        Medication List     STOP taking these medications     furosemide 40 MG tablet Commonly known as: LASIX   potassium chloride SA 20 MEQ tablet Commonly known as: KLOR-CON M       TAKE these medications    Blood Pressure Kit Devi 1 kit by Does not apply route once a week.   Butalbital-APAP-Caffeine 50-325-40 MG capsule Take 1-2 capsules by mouth every 6 (six) hours as needed for headache.   cefadroxil 500 MG capsule Commonly known as: DURICEF Take 1 capsule (500 mg total) by mouth 2 (two) times daily for 5 days.   cyclobenzaprine 10 MG tablet Commonly known as: FLEXERIL Take 1 tablet (10 mg total) by mouth 3 (three) times daily as needed for muscle spasms (headache).   ibuprofen 600 MG tablet Commonly known as: ADVIL Take 1 tablet (600 mg total) by mouth every 6 (six) hours. What changed:  when to take this reasons to take this   Nurtec 75 MG Tbdp Generic drug: Rimegepant Sulfate Take 75 mg by mouth as needed. What changed: reasons to take this   prenatal multivitamin Tabs tablet Take 1 tablet by mouth daily.   topiramate 25 MG tablet Commonly known as: Topamax Take 3 tablets (75 mg total) by mouth daily. What changed:  how much to take when to take this   valsartan 160 MG tablet Commonly known as: Diovan Take 1 tablet (160 mg total) by mouth daily.        Follow-up Information     Lincoln Brigham, MD. Schedule an appointment as soon as possible for a visit in 1 week(s).   Specialty: Family Medicine Contact information: [address] [place] [address] [phone]  Discharge Exam: BP 131/87 (BP Location: Left Arm)   Pulse 76   Temp 98.1 F (36.7 C) (Oral)   Resp 16   Ht 5\' 6"  (1.676 m)   Wt 88.5 kg   LMP 04/26/2022   SpO2 99%   BMI 31.47 kg/m   General exam: Appears calm and comfortable  Respiratory system: Clear to auscultation. Respiratory effort normal. Cardiovascular system: S1 & S2 heard, RRR. No murmurs, rubs, gallops or clicks. Gastrointestinal system:  Abdomen is nondistended, soft and nontender. No organomegaly or masses felt. Normal bowel sounds heard. Central nervous system: Alert and oriented. No focal neurological deficits. Musculoskeletal: No edema. No calf tenderness Skin: No cyanosis. No rashes Psychiatry: Judgement and insight appear normal. Mood & affect appropriate.   Condition at discharge: stable  The results of significant diagnostics from this hospitalization (including imaging, microbiology, ancillary and laboratory) are listed below for reference.   Imaging Studies: CT Renal Stone Study  Result Date: 05/24/2022 CLINICAL DATA:  Flank pain and dysuria, initial encounter EXAM: CT ABDOMEN AND PELVIS WITHOUT CONTRAST TECHNIQUE: Multidetector CT imaging of the abdomen and pelvis was performed following the standard protocol without IV contrast. RADIATION DOSE REDUCTION: This exam was performed according to the departmental dose-optimization program which includes automated exposure control, adjustment of the mA and/or kV according to patient size and/or use of iterative reconstruction technique. COMPARISON:  None Available. FINDINGS: Lower chest: No acute abnormality. Hepatobiliary: No focal liver abnormality is seen. No gallstones, gallbladder wall thickening, or biliary dilatation. Pancreas: Unremarkable. No pancreatic ductal dilatation or surrounding inflammatory changes. Spleen: Normal in size without focal abnormality. Adrenals/Urinary Tract: Adrenal glands are within normal limits. Kidneys are well visualized bilaterally. No renal calculi or obstructive changes are noted. Ureters are within normal limits. Bladder is partially distended. Wall thickening is noted in part due to the incomplete distension although possibility of underlying cystitis deserve consideration given the clinical history. Stomach/Bowel: No obstructive or inflammatory changes of the colon are noted. Scattered diverticular changes seen without evidence of  diverticulitis. The appendix is within normal limits. Small bowel and stomach are unremarkable. Vascular/Lymphatic: No significant vascular findings are present. No enlarged abdominal or pelvic lymph nodes. Reproductive: Uterus and bilateral adnexa are unremarkable. IUD is noted in place. Other: No abdominal wall hernia or abnormality. No abdominopelvic ascites. Musculoskeletal: No acute or significant osseous findings. IMPRESSION: Bladder wall thickening in part due to incomplete distension although the possibility of underlying cystitis deserves consideration given the clinical history. No other focal abnormality is noted. Electronically Signed   By: Alcide Clever M.D.   On: 05/24/2022 23:57    Microbiology: Results for orders placed or performed during the hospital encounter of 05/24/22  Wet prep, genital     Status: Abnormal   Collection Time: 05/25/22 12:23 AM  Result Value Ref Range Status   Yeast Wet Prep HPF POC NONE SEEN NONE SEEN Final   Trich, Wet Prep NONE SEEN NONE SEEN Final   Clue Cells Wet Prep HPF POC PRESENT (A) NONE SEEN Final   WBC, Wet Prep HPF POC >=10 (A) <10 Final   Sperm NONE SEEN  Final    Comment: Performed at [facility], [address] [place], [place] [address]  Urine Culture (for pregnant, neutropenic or urologic patients or patients with an indwelling urinary catheter)     Status: Abnormal   Collection Time: 05/25/22  1:01 AM   Specimen: Urine, Clean Catch  Result Value Ref Range Status   Specimen  Description   Final    URINE, CLEAN CATCH Performed at [facility], [address] [place], [place] [address]    Special Requests   Final    NONE Performed at [facility], [address] [place], [place] [address]    Culture (A)  Final    <10,000 COLONIES/mL INSIGNIFICANT GROWTH Performed at [facility], [address] [place], [place] [address]    Report Status 05/26/2022 FINAL  Final    Labs: CBC: Recent Labs   Lab 05/24/22 2337 05/25/22 0842 05/26/22 0619  WBC 22.9* 14.3* 8.1  NEUTROABS 19.7*  --   --   HGB 15.4* 14.4 14.0  HCT 45.4 43.2 42.2  MCV 88.2 89.4 88.8  PLT 372 314 284   Basic Metabolic Panel: Recent Labs  Lab 05/24/22 2337 05/25/22 0842 05/26/22 0619  NA 135 138 139  K 3.7 3.6 3.7  CL 102 105 107  CO2 GLUCOSE 107* 118* 99  BUN CREATININE 0.90 0.72 0.82  CALCIUM 8.9 8.4* 8.7*   Liver Function Tests: Recent Labs  Lab 05/24/22 2337  AST 23  ALT 20  ALKPHOS 95  BILITOT 0.8  PROT 8.3*  ALBUMIN 4.6    Discharge time spent: 35 minutes.  Signed: Jacquelin Hawking, MD [facility] 05/26/2022

## 2022-05-26 NOTE — Hospital Course (Signed)
Michele Gardner is a 37 y.o. female with a history of hypertension and migraines with evidence of hemorrhagic cystitis on admission. No evidence of pyelonephritis on imaging. Empiric Ceftriaxone started and urine culture obtained. Insignificant growth on urine culture. Patient with quick improvement of symptoms and was transitioned to Cefadroxil on discharge.

## 2022-05-26 NOTE — Discharge Instructions (Signed)
Michele Gardner,  You were in the hospital with a urinary tract infection. This has improved with antibiotics. Please continue antibiotics as directed.

## 2022-05-26 NOTE — Plan of Care (Signed)

## 2022-05-26 NOTE — Progress Notes (Signed)
Patient given discharge, follow up, and medication instructions, verbalized understanding, IV and telemetry monitor removed, personal belongings and work note with patient, family to transport home

## 2022-05-30 ENCOUNTER — Ambulatory Visit: Payer: Medicaid Other | Admitting: Cardiology

## 2022-06-02 ENCOUNTER — Emergency Department (HOSPITAL_COMMUNITY): Payer: Medicaid Other

## 2022-06-02 ENCOUNTER — Encounter (HOSPITAL_COMMUNITY): Payer: Self-pay

## 2022-06-02 ENCOUNTER — Emergency Department (HOSPITAL_COMMUNITY)
Admission: EM | Admit: 2022-06-02 | Discharge: 2022-06-02 | Disposition: A | Discharge status: Home / Self Care | Payer: Medicaid Other | Attending: Emergency Medicine | Admitting: Emergency Medicine

## 2022-06-02 ENCOUNTER — Other Ambulatory Visit: Payer: Self-pay

## 2022-06-02 DIAGNOSIS — R319 Hematuria, unspecified: Secondary | ICD-10-CM | POA: Diagnosis not present

## 2022-06-02 DIAGNOSIS — K5730 Diverticulosis of large intestine without perforation or abscess without bleeding: Secondary | ICD-10-CM | POA: Diagnosis not present

## 2022-06-02 DIAGNOSIS — Z79899 Other long term (current) drug therapy: Secondary | ICD-10-CM | POA: Diagnosis not present

## 2022-06-02 DIAGNOSIS — R109 Unspecified abdominal pain: Secondary | ICD-10-CM | POA: Diagnosis not present

## 2022-06-02 DIAGNOSIS — I10 Essential (primary) hypertension: Secondary | ICD-10-CM | POA: Diagnosis not present

## 2022-06-02 LAB — BASIC METABOLIC PANEL
Anion gap: 8 (ref 5–15)
BUN: 13 mg/dL (ref 6–20)
CO2: 24 mmol/L (ref 22–32)
Calcium: 8.6 mg/dL — ABNORMAL LOW (ref 8.9–10.3)
Chloride: 106 mmol/L (ref 98–111)
Creatinine, Ser: 0.84 mg/dL (ref 0.44–1.00)
GFR, Estimated: >60 mL/min (ref >60)
Glucose, Bld: 112 mg/dL — ABNORMAL HIGH (ref 70–99)
Potassium: 3.3 mmol/L — ABNORMAL LOW (ref 3.5–5.1)
Sodium: 138 mmol/L (ref 135–145)

## 2022-06-02 LAB — URINALYSIS, ROUTINE W REFLEX MICROSCOPIC
Bacteria, UA: NONE SEEN
Bilirubin Urine: NEGATIVE
Glucose, UA: NEGATIVE mg/dL
Ketones, ur: NEGATIVE mg/dL
Nitrite: NEGATIVE
Protein, ur: 30 mg/dL — AB
Specific Gravity, Urine: 1.033 — ABNORMAL HIGH (ref 1.005–1.030)
pH: 5 (ref 5.0–8.0)

## 2022-06-02 LAB — CBC
HCT: 43.1 % (ref 36.0–46.0)
Hemoglobin: 14.5 g/dL (ref 12.0–15.0)
MCH: 29.3 pg (ref 26.0–34.0)
MCHC: 33.6 g/dL (ref 30.0–36.0)
MCV: 87.1 fL (ref 80.0–100.0)
Platelets: 329 10*3/uL (ref 150–400)
RBC: 4.95 MIL/uL (ref 3.87–5.11)
RDW: 12.5 % (ref 11.5–15.5)
WBC: 8 10*3/uL (ref 4.0–10.5)
nRBC: 0 % (ref 0.0–0.2)

## 2022-06-02 LAB — I-STAT BETA HCG BLOOD, ED (MC, WL, AP ONLY): I-stat hCG, quantitative: 23.6 m[IU]/mL — ABNORMAL HIGH (ref <5)

## 2022-06-02 LAB — PREGNANCY, URINE: Preg Test, Ur: NEGATIVE

## 2022-06-02 NOTE — Discharge Instructions (Addendum)
As we discussed, your workup in the ER today was reassuring for acute findings.  Laboratory evaluation and CT imaging did not reveal any emergent cause of your symptoms.  Given that you continue to have blood in your urine without any infectious cause, I have given you a referral to urology with a number to call to schedule an appointment for follow-up.  In the interim, we have also sent a urine culture to test for any signs of a urinary tract infection not seen on the basic urine testing.  If this grows any bacteria in the next few days you will get a phone call with recommendations for antibiotics to take.  Return if development of any new or worsening symptoms.

## 2022-06-02 NOTE — ED Provider Notes (Signed)
[place] EMERGENCY DEPARTMENT AT [facility] Provider Note   CSN: [id number] Arrival date & time: 06/02/22  1525     History  Chief Complaint  Patient presents with   Hematuria   Flank Pain    Michele Gardner is a 37 y.o. female.  Patient with history of hypertension presents today with complaints of hematuria and flank pain. She states that she was just admitted for same on 4/11-4/12 after meeting sepsis criteria with a UTI. She was started on IV antibiotics and then transitioned to oral antibiotics and had resolution of her symptoms. She states that 2 days ago she began to again notice blood in her urine and pain with urination and was concerned for recurrence of symptoms. She denies fevers, chills, abdominal pain, nausea, vomiting, or diarrhea. No vaginal discharge. Does endorse bilateral flank pain. She has never seen a urologist.   The history is provided by the patient. No language interpreter was used.  Hematuria  Flank Pain       Home Medications Prior to Admission medications   Medication Sig Start Date End Date Taking? Authorizing Provider  Blood Pressure Monitoring (BLOOD PRESSURE KIT) DEVI 1 kit by Does not apply route once a week. 01/24/21   Constant, Peggy, MD  Butalbital-APAP-Caffeine [id number] MG capsule Take 1-2 capsules by mouth every 6 (six) hours as needed for headache. 04/29/21   Glyn Ade, Scot Jun, PA-C  cyclobenzaprine (FLEXERIL) 10 MG tablet Take 1 tablet (10 mg total) by mouth 3 (three) times daily as needed for muscle spasms (headache). 11/11/21   Glyn Ade, Scot Jun, PA-C  ibuprofen (ADVIL) 600 MG tablet Take 1 tablet (600 mg total) by mouth every 6 (six) hours. Patient taking differently: Take 600 mg by mouth every 6 (six) hours as needed for headache or moderate pain. 08/11/21   Reva Bores, MD  Prenatal Vit-Fe Fumarate-FA (PRENATAL MULTIVITAMIN) TABS tablet Take 1 tablet by mouth daily.    [provider]  Rimegepant  Sulfate (NURTEC) 75 MG TBDP Take 75 mg by mouth as needed. Patient taking differently: Take 75 mg by mouth as needed (Migraine). 11/11/21   Glyn Ade, Scot Jun, PA-C  topiramate (TOPAMAX) 25 MG tablet Take 3 tablets (75 mg total) by mouth daily. Patient taking differently: Take 25 mg by mouth in the morning, at noon, and at bedtime. 11/11/21   Glyn Ade, Scot Jun, PA-C  valsartan (DIOVAN) 160 MG tablet Take 1 tablet (160 mg total) by mouth daily. 01/12/22   Tobb, Kardie, DO      Allergies    Bee venom    Review of Systems   Review of Systems  Genitourinary:  Positive for flank pain and hematuria.  All other systems reviewed and are negative.   Physical Exam Updated Vital Signs BP (!) 156/120 (BP Location: Left Arm)   Pulse 65   Temp 97.9 F (36.6 C) (Oral)   Resp 16   Ht  (1.676 m)   Wt 88.9 kg   LMP 04/26/2022   SpO2 100%   BMI 31.64 kg/m  Physical Exam Vitals and nursing note reviewed.  Constitutional:      General: She is not in acute distress.    Appearance: Normal appearance. She is normal weight. She is not ill-appearing, toxic-appearing or diaphoretic.  HENT:     Head: Normocephalic and atraumatic.  Cardiovascular:     Rate and Rhythm: Normal rate.  Pulmonary:     Effort: Pulmonary effort is normal. No  respiratory distress.  Abdominal:     General: Abdomen is flat.     Palpations: Abdomen is soft.     Tenderness: There is right CVA tenderness and left CVA tenderness.  Musculoskeletal:        General: Normal range of motion.     Cervical back: Normal range of motion.  Skin:    General: Skin is warm and dry.  Neurological:     General: No focal deficit present.     Mental Status: She is alert.  Psychiatric:        Mood and Affect: Mood normal.        Behavior: Behavior normal.     ED Results / Procedures / Treatments   Labs (all labs ordered are listed, but only abnormal results are displayed) Labs Reviewed  URINALYSIS, ROUTINE W REFLEX  MICROSCOPIC - Abnormal; Notable for the following components:      Result Value   APPearance HAZY (*)    Specific Gravity, Urine 1.033 (*)    Hgb urine dipstick SMALL (*)    Protein, ur 30 (*)    Leukocytes,Ua MODERATE (*)    All other components within normal limits  BASIC METABOLIC PANEL - Abnormal; Notable for the following components:   Potassium 3.3 (*)    Glucose, Bld 112 (*)    Calcium 8.6 (*)    All other components within normal limits  I-STAT BETA HCG BLOOD, ED ([facility], [facility], [facility] ONLY) - Abnormal; Notable for the following components:   I-stat hCG, quantitative 23.6 (*)    All other components within normal limits  URINE CULTURE  CBC  PREGNANCY, URINE    EKG None  Radiology CT Renal Stone Study  Result Date: 06/02/2022 CLINICAL DATA:  Pyelonephritis, low back pain. Abdominal/flank pain, stone suspected EXAM: CT ABDOMEN AND PELVIS WITHOUT CONTRAST TECHNIQUE: Multidetector CT imaging of the abdomen and pelvis was performed following the standard protocol without IV contrast. RADIATION DOSE REDUCTION: This exam was performed according to the departmental dose-optimization program which includes automated exposure control, adjustment of the mA and/or kV according to patient size and/or use of iterative reconstruction technique. COMPARISON:  05/24/2022 FINDINGS: Lower chest: No acute abnormality. Hepatobiliary: No focal liver abnormality is seen. No gallstones, gallbladder wall thickening, or biliary dilatation. Pancreas: Unremarkable Spleen: Unremarkable Adrenals/Urinary Tract: Stable 10 mm nodule within the medial limb of the left adrenal gland demonstrating a density of 4 Hounsfield units, compatible with a benign adrenal adenoma. No follow-up imaging is recommended for this lesion. The right adrenal gland is unremarkable. Kidneys are normal on this noncontrast examination. Previously noted circumferential bladder wall thickening and perivesicular inflammatory stranding has resolved. The  bladder is unremarkable. Stomach/Bowel: Mild pancolonic diverticulosis. The stomach, small bowel, and large bowel are otherwise unremarkable. No evidence of obstruction or focal inflammation. No free intraperitoneal gas or fluid. Vascular/Lymphatic: No significant vascular findings are present. No enlarged abdominal or pelvic lymph nodes. Reproductive: Intrauterine device in expected position. Pelvic organs are otherwise unremarkable. Other: Tiny fat containing umbilical hernia. Musculoskeletal: No acute bone abnormality. No lytic or blastic bone lesion. IMPRESSION: 1. No acute intra-abdominal pathology identified. No definite radiographic explanation for the patient's reported symptoms. 2. Mild pancolonic diverticulosis without superimposed acute inflammatory change. 3. Stable 10 mm benign left adrenal adenoma. No follow-up imaging is recommended for this lesion. Electronically Signed   By: Helyn Numbers M.D.   On: 06/02/2022 18:56    Procedures Procedures    Medications Ordered in ED Medications - No  data to display  ED Course/ Medical Decision Making/ A&P                             Medical Decision Making Amount and/or Complexity of Data Reviewed Labs: ordered. Radiology: ordered.   This patient is a 37 y.o. female who presents to the ED for concern of flank pain, this involves an extensive number of treatment options, and is a complaint that carries with it a high risk of complications and morbidity. The emergent differential diagnosis prior to evaluation includes, but is not limited to,  AAA, renal artery/vein embolism/thrombosis, mesenteric ischemia, pyelonephritis, nephrolithiasis, cystitis, biliary colic, pancreatitis, perforated peptic ulcer, appendicitis, diverticulitis, bowel obstruction, Ectopic Pregnancy, PID/TOA, Ovarian cyst, Ovarian torsion,  This is not an exhaustive differential.   Past Medical History / Co-morbidities / Social History: Hx hypertension  Additional  history: Chart reviewed. Pertinent results include: recent admission for sepsis from UTI from 4/11-4/12  Physical Exam: Physical exam performed. The pertinent findings include: bilateral CVA tenderness to palpation  Lab Tests: I ordered, and personally interpreted labs.  The pertinent results include:  K 3.3. No leukocytosis. UA with moderate leukocytes, RBCs, WBC 11-20 however no bacteria and 21-50 squamous epithelial cells likely dirty sample.   Imaging Studies: I ordered imaging studies including CT renal. I independently visualized and interpreted imaging which showed   1. No acute intra-abdominal pathology identified. No definite radiographic explanation for the patient's reported symptoms. 2. Mild pancolonic diverticulosis without superimposed acute inflammatory change. 3. Stable 10 mm benign left adrenal adenoma. No follow-up imaging is recommended for this lesion.  I agree with the radiologist interpretation.  Disposition:  Patient presents today with complaints of flank pain, dysuria, and hematuria.  She is afebrile, nontoxic-appearing, and in no acute distress with reassuring vital signs.  Laboratory evaluation and imaging benign, no clear etiology for symptoms.  Her urine is noninfectious and therefore no indication for antibiotics.  A urine culture is pending at this time.  Given her persistent hematuria without infection, will give a referral to urology for follow-up. Evaluation and diagnostic testing in the emergency department does not suggest an emergent condition requiring admission or immediate intervention beyond what has been performed at this time.  Plan for discharge with close PCP follow-up.  Patient is understanding and amenable with plan, educated on red flag symptoms that would prompt immediate return.  Patient discharged in stable condition.   Final Clinical Impression(s) / ED Diagnoses Final diagnoses:  Hematuria, unspecified type  Flank pain    Rx / DC  Orders ED Discharge Orders     None     An After Visit Summary was printed and given to the patient.     Vear Clock 06/02/22 2033    Virgina Norfolk, DO 06/02/22 2037

## 2022-06-02 NOTE — ED Triage Notes (Signed)
Seen here last week and admitted for kidney infection. Pt states she started having hematuria last night and worsening flank pain the last 2 days. C/o nausea and diarrhea. Pt states she has been taking PO abx she was sent home with from hospital.

## 2022-06-02 NOTE — ED Notes (Signed)
Pt resting in bed with no acute distress noted at this time.  

## 2022-06-04 LAB — URINE CULTURE

## 2022-06-06 ENCOUNTER — Ambulatory Visit (INDEPENDENT_AMBULATORY_CARE_PROVIDER_SITE_OTHER): Payer: Medicaid Other | Admitting: Family Medicine

## 2022-06-06 ENCOUNTER — Other Ambulatory Visit (HOSPITAL_COMMUNITY)
Admission: RE | Admit: 2022-06-06 | Discharge: 2022-06-06 | Disposition: A | Discharge status: Home / Self Care | Payer: Medicaid Other | Source: Ambulatory Visit | Attending: Family Medicine | Admitting: Family Medicine

## 2022-06-06 ENCOUNTER — Encounter: Payer: Self-pay | Admitting: Family Medicine

## 2022-06-06 VITALS — BP 166/116 | HR 72 | Ht 66.0 in | Wt 197.5 lb

## 2022-06-06 DIAGNOSIS — R300 Dysuria: Secondary | ICD-10-CM | POA: Diagnosis present

## 2022-06-06 DIAGNOSIS — I10 Essential (primary) hypertension: Secondary | ICD-10-CM

## 2022-06-06 LAB — POCT UA - MICROSCOPIC ONLY: WBC, Ur, HPF, POC: NONE SEEN (ref 0–5)

## 2022-06-06 LAB — POCT URINALYSIS DIP (MANUAL ENTRY)
Bilirubin, UA: NEGATIVE
Glucose, UA: NEGATIVE mg/dL
Ketones, POC UA: NEGATIVE mg/dL
Leukocytes, UA: NEGATIVE
Nitrite, UA: NEGATIVE
Protein Ur, POC: NEGATIVE mg/dL
Spec Grav, UA: 1.01 (ref 1.010–1.025)
Urobilinogen, UA: 0.2 E.U./dL
pH, UA: 6.5 (ref 5.0–8.0)

## 2022-06-06 LAB — POCT WET PREP (WET MOUNT)
Clue Cells Wet Prep Whiff POC: NEGATIVE
Trichomonas Wet Prep HPF POC: ABSENT
WBC, Wet Prep HPF POC: >20

## 2022-06-06 MED ORDER — AMLODIPINE BESYLATE 5 MG PO TABS
5.0000 mg | ORAL_TABLET | Freq: Every day | ORAL | 3 refills | Status: DC
Start: 2022-06-06 — End: 2022-06-06

## 2022-06-06 MED ORDER — HYDROCHLOROTHIAZIDE 25 MG PO TABS
25.0000 mg | ORAL_TABLET | Freq: Every day | ORAL | 3 refills | Status: DC
Start: 2022-06-06 — End: 2022-11-28

## 2022-06-06 MED ORDER — PHENAZOPYRIDINE HCL 200 MG PO TABS
200.0000 mg | ORAL_TABLET | Freq: Three times a day (TID) | ORAL | 0 refills | Status: DC | PRN
Start: 2022-06-06 — End: 2022-11-28

## 2022-06-06 NOTE — Patient Instructions (Addendum)
It was wonderful to see you today.  Please bring ALL of your medications with you to every visit.   Today we talked about:  I have sent in a medication called pyridium to help with your symptoms.  Continue to follow up with Urology to see if you can be seen earlier.   This may still be residual irritation from your previous infection. We did swabs to check for sexually transmitted infections.  Her blood pressure was still high today. I have sent in 2 new medications to help control it.  Take the valsartan along with the new medications (amlodipine and hydrochlorothiazide) and return in 1 week for blood pressure follow up and blood work.   Thank you for coming to your visit as scheduled. We have had a large "no-show" problem lately, and this significantly limits our ability to see and care for patients. As a friendly reminder- if you cannot make your appointment please call to cancel. We do have a no show policy for those who do not cancel within 24 hours. Our policy is that if you miss or fail to cancel an appointment within 24 hours, 3 times in a 31-month period, you may be dismissed from our clinic.   Thank you for choosing [facility].   Please call [phone] with any questions about today's appointment.  Please be sure to schedule follow up at the front  desk before you leave today.   Sabino Dick, DO PGY-3 Family Medicine    Interstitial Cystitis  Interstitial cystitis is inflammation of the bladder. This condition is also known as painful bladder syndrome. This may cause pain in the bladder area as well as a frequent and urgent need to urinate. The bladder is an organ that stores urine after the urine is made in the kidneys. The severity of interstitial cystitis can vary from person to person. You may have flare-ups, and then your symptoms may go away for a while. For many people, it becomes a long-term (chronic) problem. What are the causes? The cause of  this condition is not known. What increases the risk? The following factors may make you more likely to develop this condition: Being female. Having fibromyalgia. Having irritable bowel syndrome (IBS). Having endometriosis. Having chronic fatigue syndrome. This condition may be aggravated by: Stress. Smoking. Spicy foods. What are the signs or symptoms? Symptoms of interstitial cystitis vary, and they can change over time. Symptoms may include: Discomfort or pain in the bladder area, which is in the lower abdomen. Pain can range from mild to severe. The pain may change in intensity as the bladder fills with urine or as it empties. Pain in the pelvic area, between the hip bones. A constant urge to urinate. Frequent urination. Pain during urination. Pain during sex. Blood in the urine. Feeling tired (fatigue). For women, symptoms often get worse during menstruation. How is this diagnosed? This condition is diagnosed based on your symptoms, your medical history, and a physical exam. Your health care provider may need to rule out other conditions and may order other tests, such as: Urine tests. Cystoscopy. For this test, a tool similar to a very thin telescope is used to look into your bladder. Biopsy. This involves taking a sample of tissue from the bladder to be examined under a microscope. How is this treated? There is no cure for this condition, but treatment can help you control your symptoms. Work closely with your health care provider to find the most effective treatments for  you. Treatment options may include: Medicines to relieve pain and reduce how often you feel the need to urinate. This treatment may include: A procedure where a small amount of medicine that eases irritation is put inside your bladder through a catheter (bladder instillation). Lifestyle changes, such as changing your diet or taking steps to control stress. Physical therapy. This may include: Exercises to  help relax the pelvic floor muscles. Massage to relax tight muscles (myofascial release). Learning ways to control when you urinate (bladder training). Using a device that provides electrical stimulation to your nerves, which can relieve pain (neuromodulation therapy). The device is placed on your back, where it blocks the nerves that cause you to feel pain in your bladder area. A procedure that stretches your bladder by filling it with air or fluid (hydrodistention). Surgery. This is rare. It is only done for extreme cases, if other treatments do not help. Follow these instructions at home: Lifestyle Learn and practice relaxation techniques, such as deep breathing and muscle relaxation. Get care for your body and mental well-being, such as: Cognitive behavioral therapy (CBT). This therapy changes the way you think or act in response to different situations. This may improve how you feel. Seeing a mental health therapist to evaluate and treat depression, if necessary. Work with your health care provider on other ways to manage pain. Acupuncture may be helpful. Avoid drinking alcohol. Do not use any products that contain nicotine or tobacco. These products include cigarettes, chewing tobacco, and vaping devices, such as e-cigarettes. If you need help quitting, ask your health care provider. Eating and drinking Make dietary changes as recommended by your health care provider. You may need to avoid: Spicy foods. Foods that contain a lot of potassium. Limit your intake of drinks that increase your urge to urinate. These include alcohol and caffeinated drinks like soda, coffee, and tea. Bladder training  Use bladder training techniques as directed. Techniques may include: Urinating at scheduled times. Training yourself to delay urination. Keep a bladder diary. Write down the times you urinate and any symptoms that you have. This can help you find out which foods, liquids, or activities make your  symptoms worse. Use your bladder diary to schedule bathroom trips. If you are away from home, plan to be near a bathroom at each of your scheduled times. Make sure that you urinate just before you leave the house and just before you go to bed. General instructions Take over-the-counter and prescription medicines only as told by your health care provider. Try a warm or cool compress over your bladder for comfort. Avoid wearing tight clothing. Do exercises to relax your pelvic floor muscles as told by your physical therapist. Keep all follow-up visits. This is important. Where to find more information To find more information or a support group near you, visit: [employer]: urologyhealth.org Interstitial Cystitis Association: [employer] Contact a health care provider if you have: Symptoms that do not get better with treatment. Pain or discomfort that gets worse. More frequent urges to urinate. A fever. Get help right away if: You have no control over when you urinate. Summary Interstitial cystitis is inflammation of the bladder. This condition may cause pain in the bladder area as well as a frequent and urgent need to urinate. You may have flare-ups of the condition, and then it may go away for a while. For many people, it becomes a long-term (chronic) problem. There is no cure for interstitial cystitis, but treatment methods are available to control  your symptoms. This information is not intended to replace advice given to you by your health care provider. Make sure you discuss any questions you have with your health care provider. Document Revised: 09/05/2019 Document Reviewed: 09/05/2019 Elsevier Patient Education  2023 [employer].

## 2022-06-06 NOTE — Progress Notes (Signed)
SUBJECTIVE:   CHIEF COMPLAINT / HPI:   Michele Gardner is a 37 y.o. female who presents to the [facility] clinic today to discuss the following concerns:   F/u ED: Dysuria, hematuria  Patient was recently admitted from 4/10 to 4/12 for hemorrhagic cystitis.  She was given ceftriaxone and ultimately transition to cefadroxil on discharge.  Her urine culture did not show any significant growth.  She then presented to the emergency department on 4/19 for recurrent hematuria. She had a UA which showed moderate leukocytes, 6-10 RBC. No bacteria seen. Urine culture showed multiple species.  She was instructed to follow-up with urology.  Still had dysuria and having urinary frequency.  She reports drinking plenty of water.  Feels nauseous but without vomiting.  Has bilateral back pain. Gets "hot" but without any fevers. Has central abdominal pain that is constant and stabbing sensation.   Has appointment with Urology on 6/13 with Dr. Mena Goes.   Former smoker. Was smoking 1-1.5 pack per day for 5.5 years. Quit about 7 years ago.  No Fhx bladder cancer.  Does have Fhx of melanoma, brain cancer.  Father had melanoma.   Hypertension Takes Valsartan 160 mg daily. Reports her blood pressure is "always high". She follows with a cardiologist. Has chronic migraines- follows with Neurology.  No chest pain or SOB. No vision change or lower leg edema.   PERTINENT  PMH / PSH: HTN, asthma, GERD, myalgia, UTI   OBJECTIVE:   BP (!) 181/121   Pulse 72   Ht  (1.676 m)   Wt 197 lb 8 oz (89.6 kg)   LMP 04/26/2022   SpO2 98%   BMI 31.88 kg/m   Vitals:   06/06/22 1351 06/06/22 1423  BP: (!) 181/121 (!) 166/116  Pulse: 72   SpO2: 98%    General: NAD, pleasant, able to participate in exam Cardiac: RRR, no murmurs. Respiratory: CTAB, normal effort, No wheezes, rales or rhonchi Abdomen: Bowel sounds present, mild ttp suprapubic and RLQ without R/G, nondistended, soft . GU: Normal appearance of labia  majora and minora, without lesions. Vagina tissue pink, moist, without lesions or abrasions. Cervix normal appearance, non-friable, with white/clear discharge from os.  Back: No CVAT bilaterally  Extremities: no edema  Skin: warm and dry, no rashes noted Psych: Normal affect and mood  GU exam chaperoned by Aquilla Solian, CMA   ASSESSMENT/PLAN:   1. Dysuria Ongoing with 2 negative urine cultures thus far. UA with trace-intact blood but only 0-2 RBC on microscopy. Has Urology follow up in June but on cancellation list as well. Could be continued irritation from previous infection. Other differentials include IC or vaginitis. Will treat symptomatically for now. She was given information on IC in AVS. - POCT urinalysis dipstick - POCT UA - Microscopic Only - phenazopyridine (PYRIDIUM) 200 MG tablet; Take 1 tablet (200 mg total) by mouth 3 (three) times daily as needed for pain.  Dispense: 10 tablet; Refill: 0 - POCT Wet Prep [facility]) - Cervicovaginal ancillary only  2. Benign essential hypertension Significantly elevated on 2 checks.  Compliant with current valsartan 160 mg daily. Patient reports intolerance to CCB as it worsens her migraines. Additionally, combination therapy does not seem to be preferred by her insurance. Would benefit from f/u in pharmacy clinic as well. Tried to schedule appt for patient but she has some work conflicts and will schedule appointment at front.  - hydrochlorothiazide (HYDRODIURIL) 25 MG tablet; Take 1 tablet (25 mg total) by  mouth daily.  Dispense: 90 tablet; Refill: 3 -Continue valsartan 160 mg daily - Return in 1 week for blood pressure follow-up and BMP   Johsua Shevlin, DO [facility]

## 2022-06-07 LAB — CERVICOVAGINAL ANCILLARY ONLY
Chlamydia: NEGATIVE
Comment: NEGATIVE
Comment: NORMAL
Neisseria Gonorrhea: NEGATIVE

## 2022-06-14 DIAGNOSIS — F4322 Adjustment disorder with anxiety: Secondary | ICD-10-CM | POA: Diagnosis not present

## 2022-06-30 DIAGNOSIS — F4322 Adjustment disorder with anxiety: Secondary | ICD-10-CM | POA: Diagnosis not present

## 2022-07-06 DIAGNOSIS — R310 Gross hematuria: Secondary | ICD-10-CM | POA: Diagnosis not present

## 2022-07-06 DIAGNOSIS — N3001 Acute cystitis with hematuria: Secondary | ICD-10-CM | POA: Diagnosis not present

## 2022-07-28 ENCOUNTER — Telehealth (INDEPENDENT_AMBULATORY_CARE_PROVIDER_SITE_OTHER): Payer: Medicaid Other | Admitting: Physician Assistant

## 2022-07-28 ENCOUNTER — Encounter: Payer: Self-pay | Admitting: Physician Assistant

## 2022-07-28 DIAGNOSIS — G43009 Migraine without aura, not intractable, without status migrainosus: Secondary | ICD-10-CM | POA: Diagnosis not present

## 2022-07-28 MED ORDER — NURTEC 75 MG PO TBDP: 75.0000 mg | ORAL_TABLET | ORAL | 2 refills | Status: DC

## 2022-07-28 MED ORDER — METOPROLOL TARTRATE 25 MG PO TABS: 25.0000 mg | ORAL_TABLET | Freq: Every day | ORAL | 2 refills | Status: DC

## 2022-07-28 NOTE — Patient Instructions (Addendum)
Nurtec - use every other day for migraine prevention.  This medication also can make a headache go away.  Place on the tongue and it should dissolve quickly.  Go online to [employer] for a savings card. The first fill should be free even before the prior authorization is completed.    Metoprolol - begin with 1/2 tab in the evening.  Increase to 1 full tab each evening if well tolerated. Monitor blood pressure/pulse and symptoms  Continue followup with other providers as heart issues/htn and urinary issues are extremely important to address.

## 2022-07-28 NOTE — Progress Notes (Signed)
Virtual Headache Visit   Pt states headaches have returned back even stronger.  Topamax not helping at all.    Headache VIRTUAL VISIT ENCOUNTER NOTE  Provider location: [facility]   Patient location: Home  I connected with [name] on 07/28/22 at  8:00 AM EDT by MyChart Video Encounter and verified that I am speaking with the correct person using two identifiers.   I discussed the limitations, risks, security and privacy concerns of performing an evaluation and management service virtually and the availability of in person appointments. I also discussed with the patient that there may be a patient responsible charge related to this service. The patient expressed understanding and agreed to proceed.   History:  Michele Gardner is a 37 y.o. [phone] female being evaluated today for headache.  She reports she began taking topiramate in September and has been taking 1 tab 3 times per day.  (She was given a total of 4 fills from this office with expectation of seeing her in 3 months but states [facility] has continued to issue refills and it has been about 9 months.)  She did not try the nurtec. When she started topiramate it was helpful but her headaches now are much worse. She is working with urology after she started having bloody urine.  She has been seen in ED and admitted for this. She is also working with cardiology and PCP.   She does not require treatment for asthma.  Her blood pressure is running 164/140something despite current dosing of ARB.       Past Medical History:  Diagnosis Date   Asthma    Hypertension    Migraine    Past Surgical History:  Procedure Laterality Date   WISDOM TOOTH EXTRACTION  2007   The following portions of the patient's history were reviewed and updated as appropriate: allergies, current medications, past family history, past medical history, past social history, past surgical history and problem list.     Review of Systems:  Pertinent items noted in HPI and remainder of comprehensive ROS otherwise negative.  Physical Exam:   General:  Alert, oriented and cooperative. Patient appears to be in no acute distress.  Mental Status: Normal mood and affect. Normal behavior. Normal judgment and thought content.   Respiratory: Normal respiratory effort, no problems with respiration noted  Rest of physical exam deferred due to type of encounter  Labs and Imaging No results found for this or any previous visit (from the past 336 hour(s)). No results found.     Assessment and Plan:     1. Migraine without aura and without status migrainosus, not intractable    I do not want to do anything that may contribute to blood pressure or urinary problems.  For this reason, I am choosing not to continue Topiramate - although in the future, higher doses may be helpful.  Try nurtec every other day for prevention and acute treatment of migraine.  Begin 1/2 tab of metoprolol for prevention of migraine.  May increase to 1 full tab if well tolerated and other providers in agreement. Monitor blood pressure/pulse and symptoms Continue followup with other providers as heart issues/htn and urinary issues are extremely important to address.   RTC 3 months - in person if possible.        I discussed the assessment and treatment plan with the patient. The patient was provided an opportunity to ask questions and all were answered. The patient  agreed with the plan and demonstrated an understanding of the instructions.   The patient was advised to call back or seek an in-person evaluation/go to the ED if the symptoms worsen or if the condition fails to improve as anticipated.  I provided 45 minutes of face-to-face time during this encounter.   Bertram Denver, PA-C [facility] [employer]

## 2022-08-06 IMAGING — US US MFM OB DETAIL+14 WK
1 series · 12 of 28 positions shown · non-contrast
Comparison: none

[Series 1: us mfm ob detail+14 wk · 96 acquisitions, 12 frames shown]
[im 4/96]
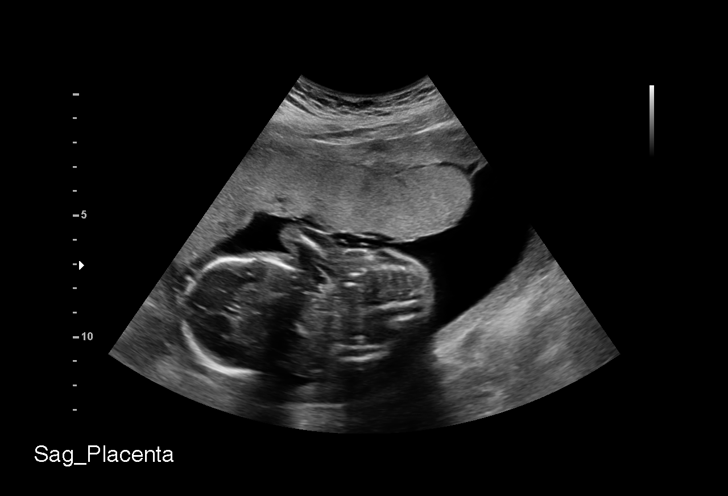
[im 11/96]
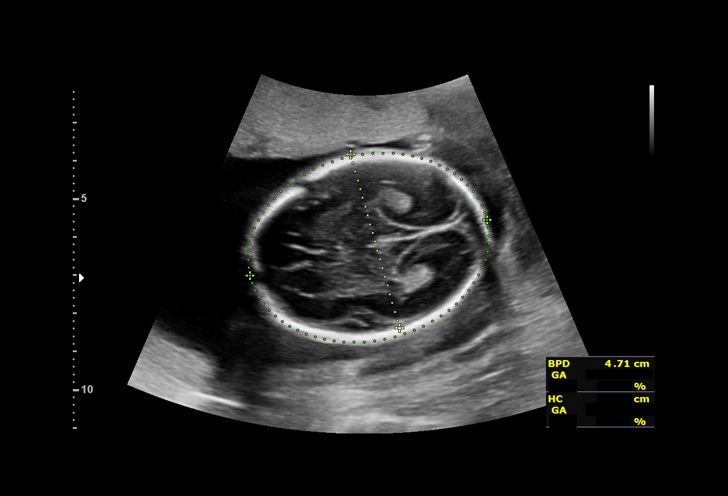
[im 18/96]
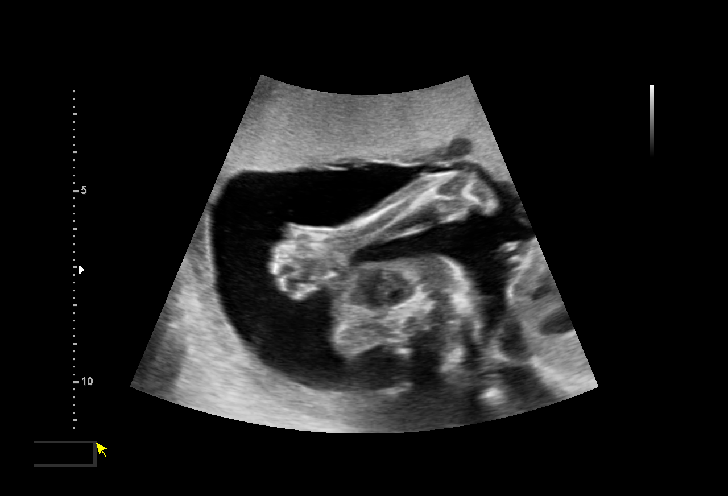
[im 29/96]
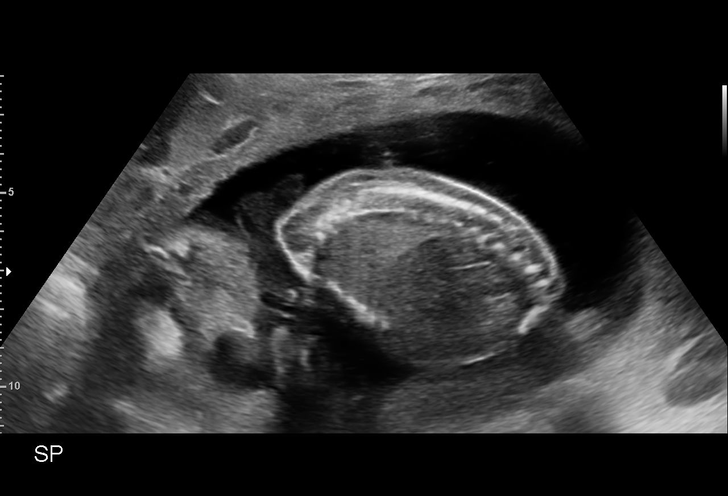
[im 36/96]
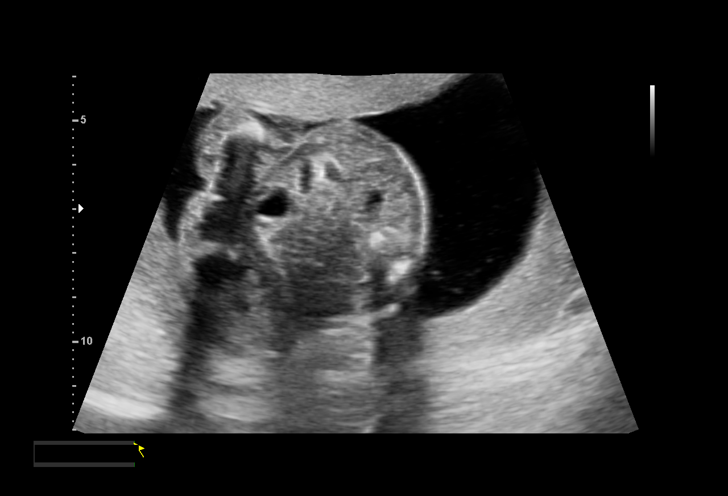
[im 43/96]
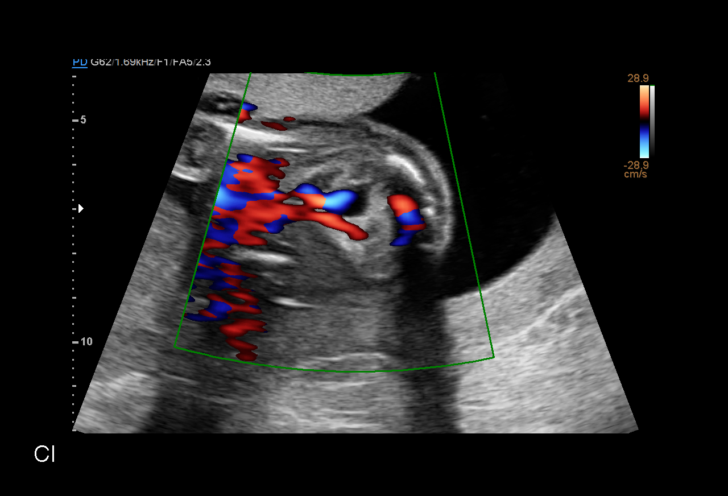
[im 53/96]
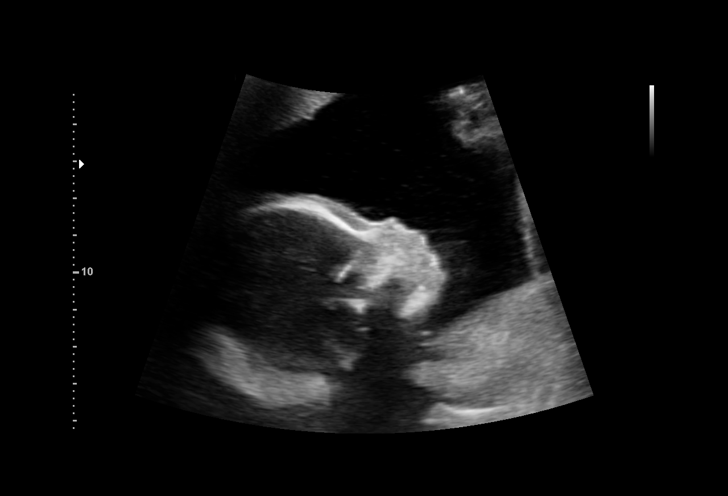
[im 60/96]
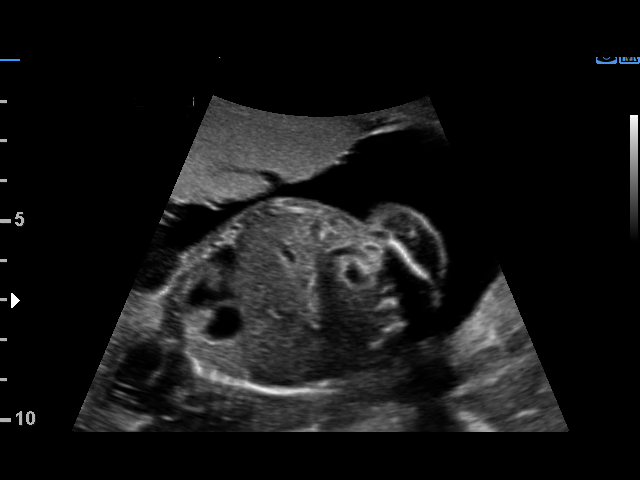
[im 67/96]
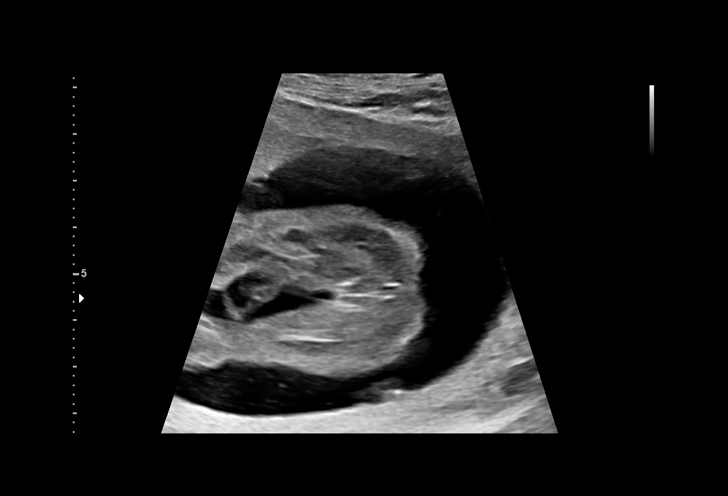
[im 78/96]
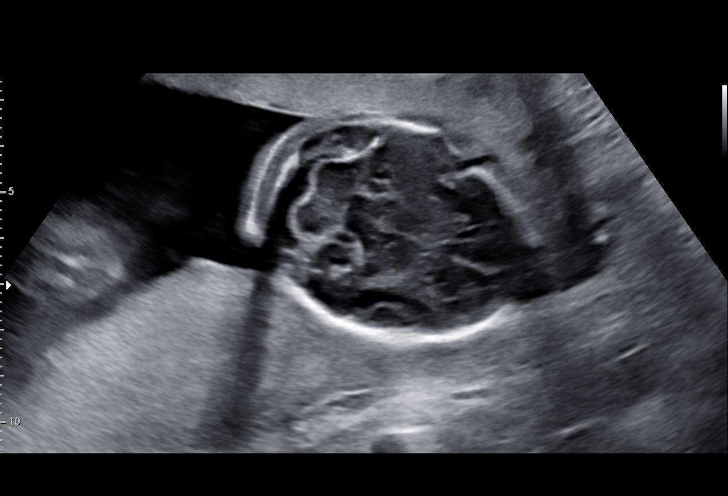
[im 85/96]
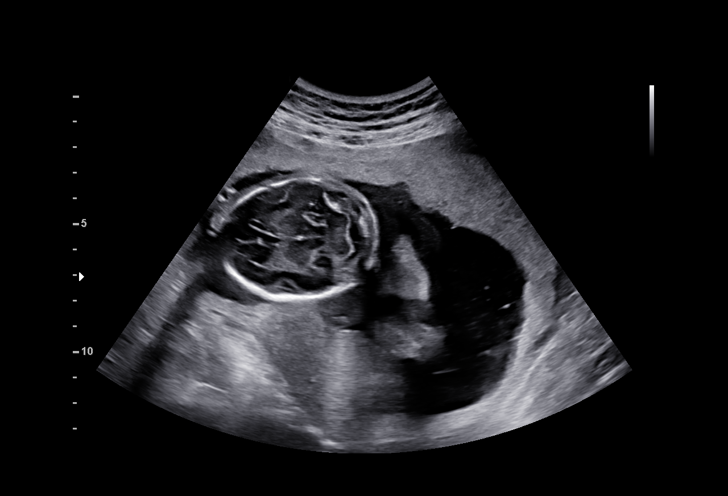
[im 92/96]
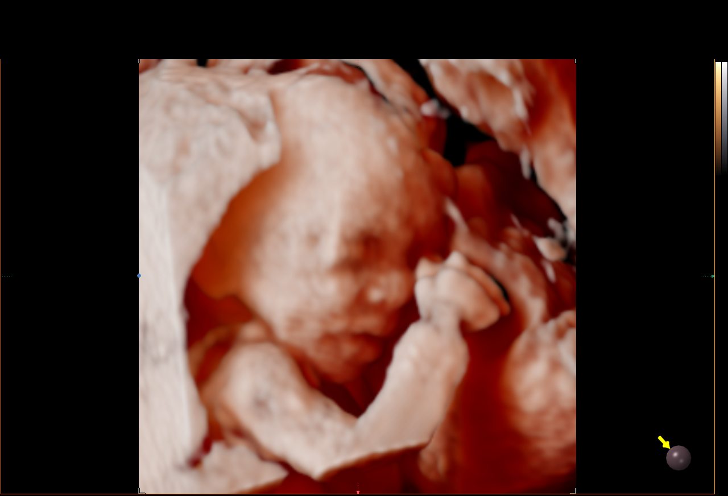

[12 of 28 positions shown; findings below may reference images not displayed]

Indications

 Hypertension - Chronic/Pre-existing
 LR NIPS, Neg Horizon, Neg AFP
 Encounter for antenatal screening for
 malformations
 19 weeks gestation of pregnancy
 Echogenic bowel
Fetal Evaluation

 Num Of Fetuses:         1
 Fetal Heart Rate(bpm):  148
 Cardiac Activity:       Observed
 Presentation:           Transverse, head to maternal left
 Placenta:               Anterior
 P. Cord Insertion:      Visualized, central

 Amniotic Fluid
 AFI FV:      Within normal limits

                             Largest Pocket(cm)

Biometry

 BPD:      46.6  mm     G. Age:  20w 1d         66  %    CI:         68.8   %    70 - 86
                                                         FL/HC:      16.8   %    16.8 -
 HC:      179.5  mm     G. Age:  20w 3d         74  %    HC/AC:      1.11        1.09 -
 AC:      161.4  mm     G. Age:  21w 2d         88  %    FL/BPD:     64.6   %
 FL:       30.1  mm     G. Age:  19w 2d         28  %    FL/AC:      18.6   %    20 - 24
 HUM:      30.8  mm     G. Age:  20w 2d         65  %
 CER:        21  mm     G. Age:  20w 0d         75  %
 NFT:       3.9  mm
 LV:        4.7  mm
 CM:        5.1  mm

 Est. FW:     347  gm    0 lb 12 oz      80  %
OB History

 Gravidity:    4         Term:   2
 TOP:          1        Living:  2
Gestational Age

 LMP:           31w 1d        Date:  09/09/20                 EDD:   06/16/21
 U/S Today:     20w 2d                                        EDD:   08/31/21
 Best:          19w 5d     Det. By:  U/S C R L  (01/24/21)    EDD:   09/04/21
Anatomy

 Cranium:               Appears normal         Aortic Arch:            Appears normal
 Cavum:                 Appears normal         Ductal Arch:            Not well visualized
 Ventricles:            Appears normal         Diaphragm:              Appears normal
 Choroid Plexus:        Appears normal         Stomach:                Appears normal, left
                                                                       sided
 Cerebellum:            Appears normal         Abdomen:                Echogenic Bowel
 Posterior Fossa:       Appears normal         Abdominal Wall:         Appears nml (cord
                                                                       insert, abd wall)
 Nuchal Fold:           Appears normal         Cord Vessels:           Appears normal (3
                                                                       vessel cord)
 Face:                  Appears normal         Kidneys:                Appear normal
                        (orbits and profile)
 Lips:                  Appears normal         Bladder:                Appears normal
 Thoracic:              Appears normal         Spine:                  Appears normal
 Heart:                 Not well visualized    Upper Extremities:      Appears normal
 RVOT:                  Not well visualized    Lower Extremities:      Appears normal
 LVOT:                  Not well visualized

 Other:  VC, 3VV and 3VTV not well visualized. Fetus appears to be female.
Cervix Uterus Adnexa

 Cervix
 Length:           4.16  cm.
 Normal appearance by transabdominal scan.

 Right Ovary
 Visualized.

 Left Ovary
 Visualized.
Comments

 Kin Kwok Baligod was seen for a detailed fetal anatomy scan due
 to chronic hypertension treated with labetalol 200 mg twice a
 day.
 She denies any other significant past medical history and
 denies any problems in her current pregnancy.
 She had a cell free DNA test earlier in her pregnancy which
 indicated a low risk for trisomy 21, 18, and 13. A female fetus
 is predicted.
 She was informed that the fetal growth and amniotic fluid
 level were appropriate for her gestational age.
 On today's exam, echogenic bowel was noted.
 The following were discussed during today's consultation:
 Echogenic bowel
 The causes of echogenic bowel including a normal variant,
 fetal aneuploidy, swallowed  blood, cystic fibrosis, and viral
 infections were discussed.  The association of bowel
 malrotation/atresia associated with echogenic bowel was also
 discussed. She denies any recent vaginal bleeding. She has
 screened negative for cystic fibrosis.
 Due to the echogenic bowel noted today, the patient was
 offered and declined an amniocentesis for definitive
 diagnosis of fetal aneuploidy.  She is comfortable with her
 negative cell free DNA test.
 The limitations of ultrasound in the detection of all anomalies
 was discussed today.
 Due to the echogenic bowel noted today, she had blood
 drawn today to be screened for CMV and toxoplasmosis.
 Due to the potential association of fetal growth restriction with
 echogenic bowel, we will continue to follow her with growth
 ultrasounds throughout her pregnancy.  We will also assess
 for signs of bowel dilatation which may be a sign of gut
 malrotation or atresia later in her pregnancy.
 Chronic hypertension in pregnancy
 The implications and management of chronic hypertension in
 pregnancy was discussed.
 The patient was advised that should her blood pressures be
 elevated later in her pregnancy, the dosage of her
 antihypertensive medications may need to be increased.
 The increased risk of superimposed preeclampsia, an
 indicated preterm delivery, and possible fetal growth
 restriction due to chronic hypertension in pregnancy was
 discussed.
 Weekly fetal testing should be started at around 32 weeks.
 To decrease her risk of superimposed preeclampsia, she
 should continue taking a daily baby aspirin (81 mg daily) for
 preeclampsia prophylaxis.
 A follow up exam was scheduled in 4 weeks.
 The patient stated that all of her questions had been
 answered.
 A total of 30 minutes was spent counseling and coordinating
 the care for this patient.  Greater than 50% of the time was
 spent in direct face-to-face contact.

## 2022-08-24 DIAGNOSIS — N3001 Acute cystitis with hematuria: Secondary | ICD-10-CM | POA: Diagnosis not present

## 2022-08-24 DIAGNOSIS — R310 Gross hematuria: Secondary | ICD-10-CM | POA: Diagnosis not present

## 2022-09-03 IMAGING — US US MFM OB FOLLOW-UP
1 series · 13 of 28 positions shown · non-contrast
Comparison: none

[Series 1: us mfm ob follow-up · 13 of 77 slices shown]
[im 3/77]
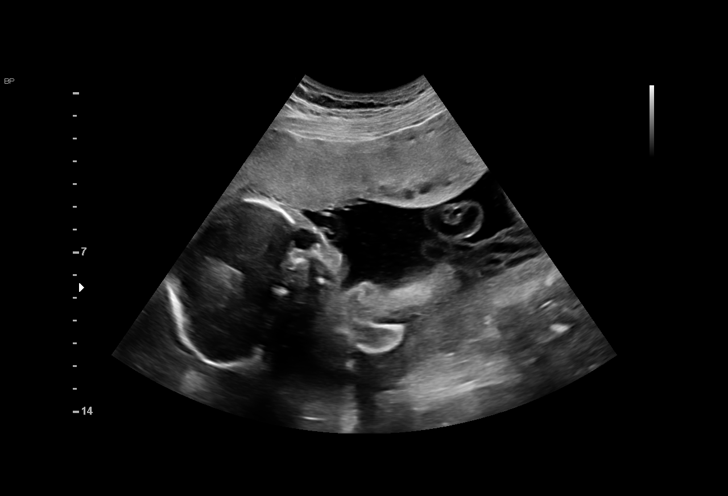
[im 9/77]
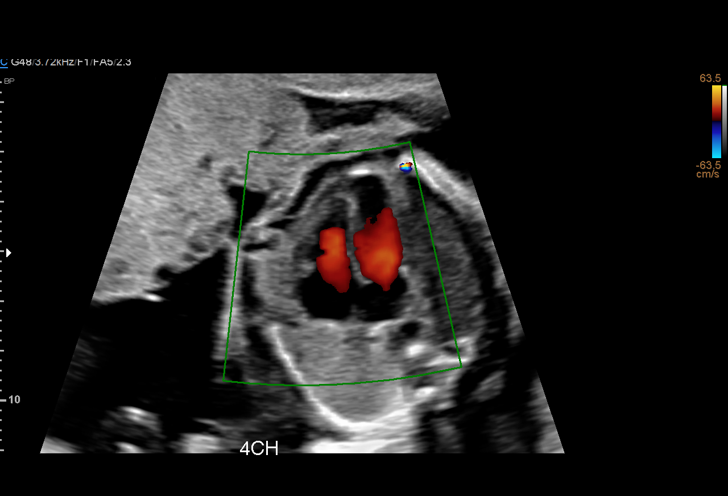
[im 15/77]
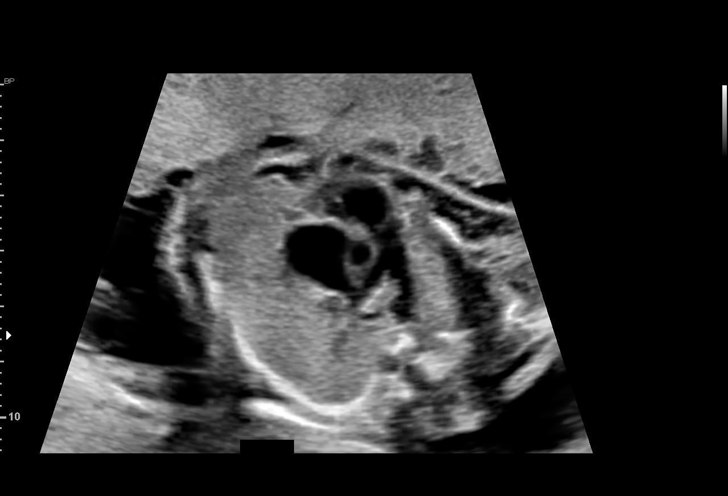
[im 20/77]
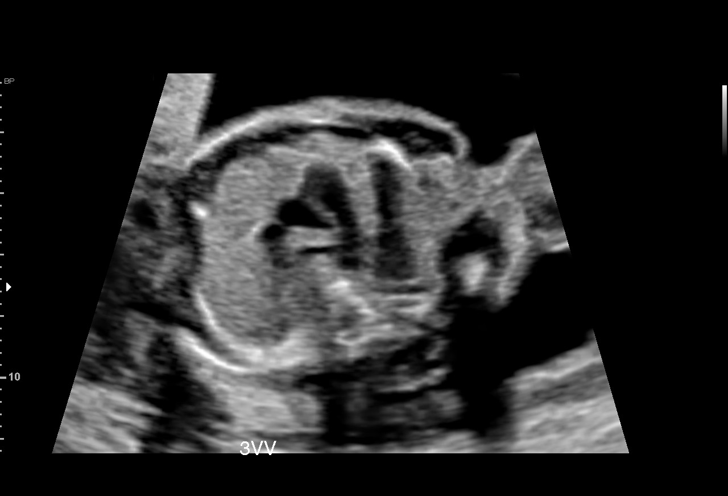
[im 26/77]
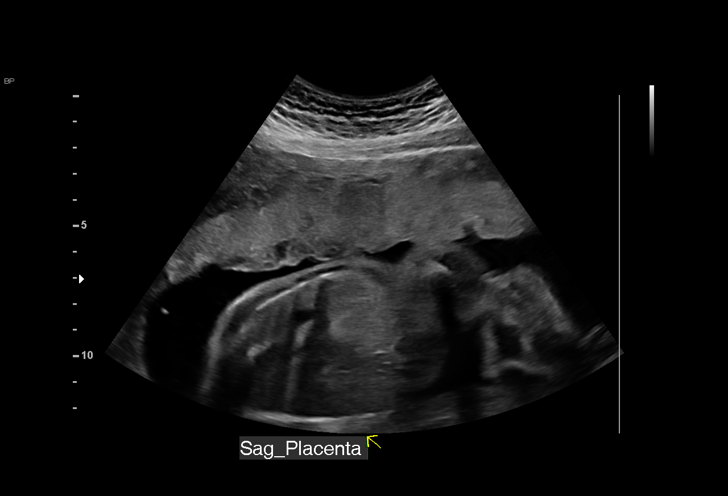
[im 31/77]
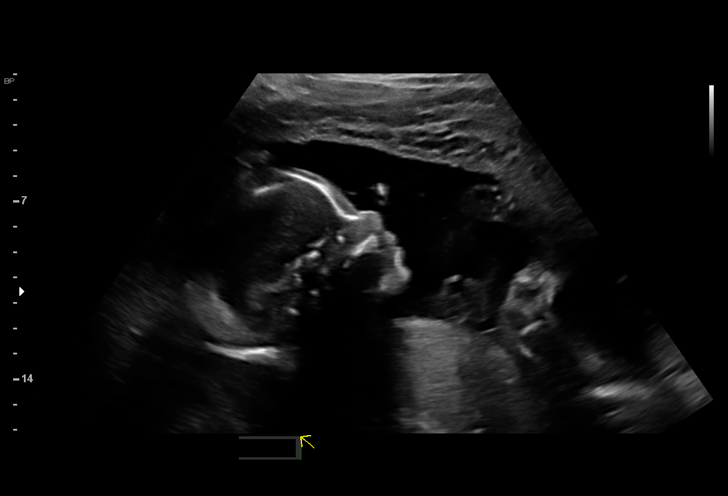
[im 40/77]
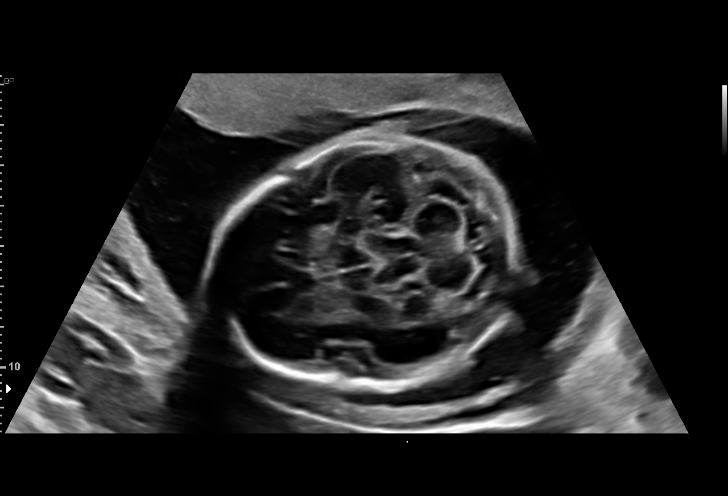
[im 46/77]
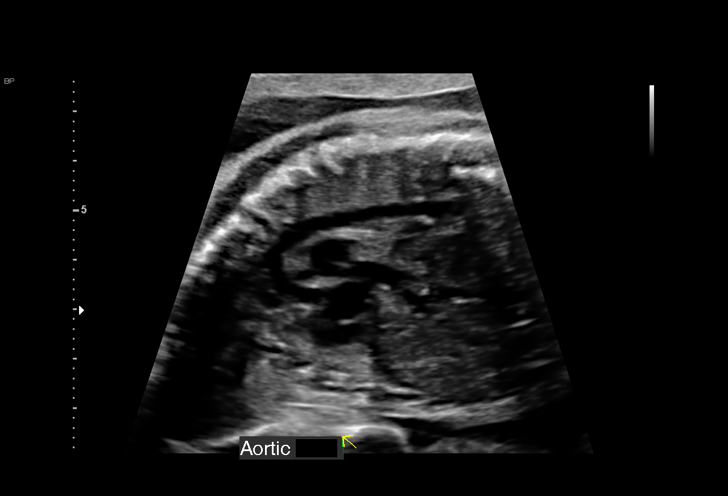
[im 51/77]
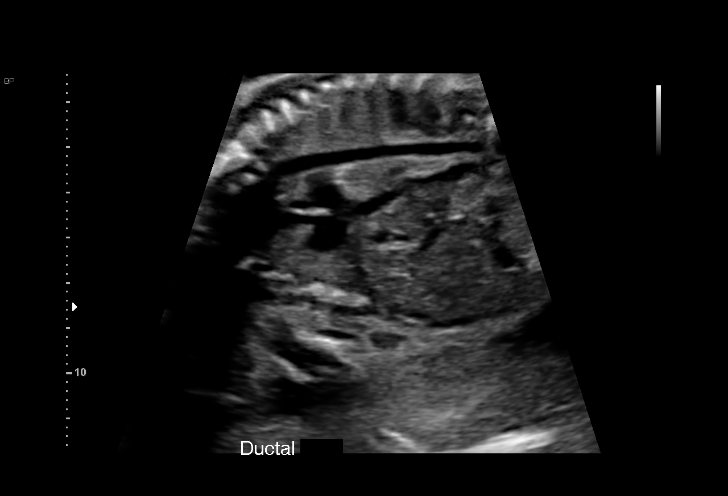
[im 57/77]
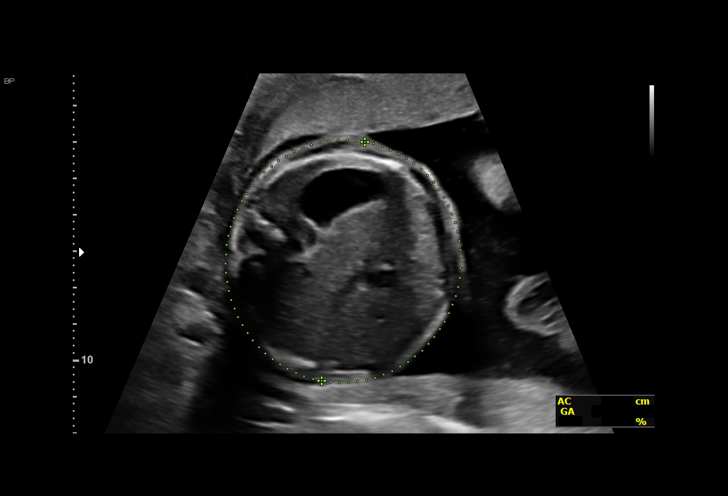
[im 62/77]
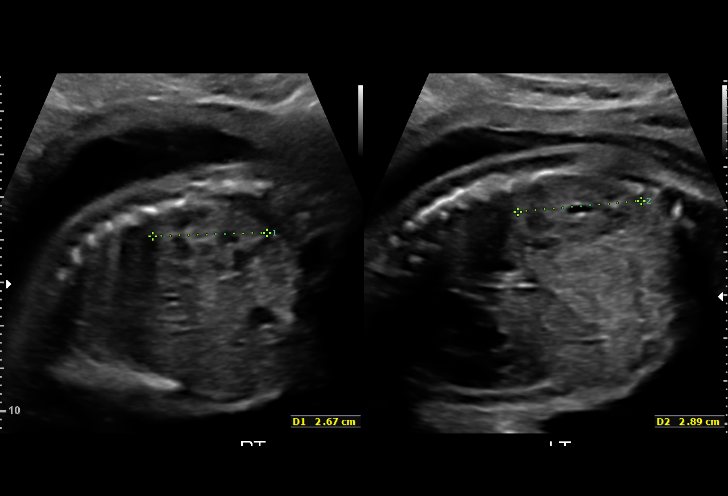
[im 68/77]
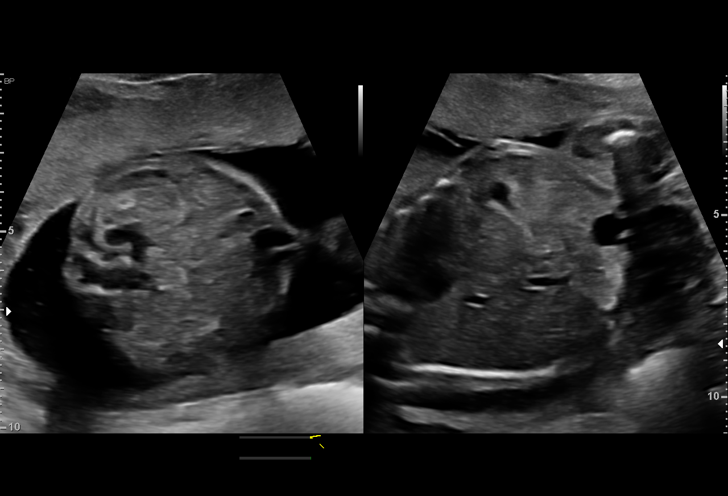
[im 74/77]
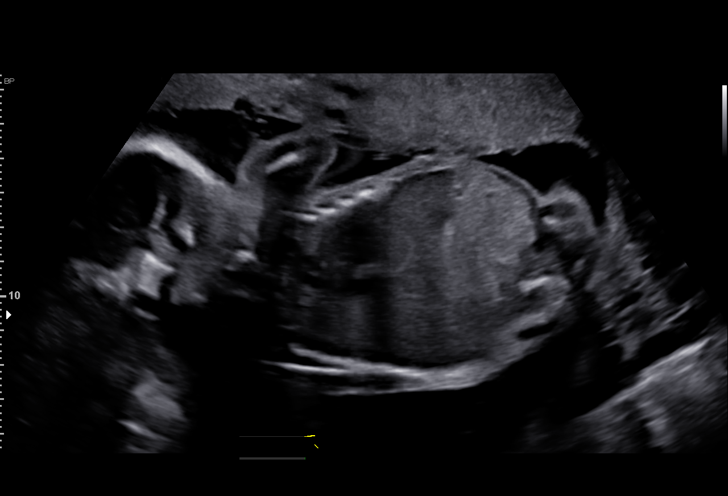

[13 of 28 positions shown; findings below may reference images not displayed]

Indications

 Hypertension - Chronic/Pre-existing
 Echogenic bowel
 23 weeks gestation of pregnancy
 LR NIPS, Neg Horizon, Neg AFP
Fetal Evaluation

 Num Of Fetuses:         1
 Fetal Heart Rate(bpm):  133
 Cardiac Activity:       Observed
 Presentation:           Breech
 Placenta:               Anterior
 P. Cord Insertion:      Visualized, central

 Amniotic Fluid
 AFI FV:      Within normal limits

                             Largest Pocket(cm)

Biometry

 BPD:      60.6  mm     G. Age:  24w 5d         78  %    CI:        72.37   %    70 - 86
                                                         FL/HC:      18.0   %    18.7 -
 HC:      226.6  mm     G. Age:  24w 5d         71  %    HC/AC:      1.10        1.05 -
 AC:      205.1  mm     G. Age:  25w 1d         82  %    FL/BPD:     67.2   %    71 - 87
 FL:       40.7  mm     G. Age:  23w 1d         22  %    FL/AC:      19.8   %    20 - 24
 HUM:      38.1  mm     G. Age:  23w 3d         36  %
 CER:        26  mm     G. Age:  23w 4d         61  %

 LV:        5.6  mm
 CM:        6.6  mm
 Est. FW:     686  gm      1 lb 8 oz     72  %
OB History

 Gravidity:    4         Term:   2
 TOP:          1        Living:  2
Gestational Age

 LMP:           35w 1d        Date:  09/09/20                  EDD:   06/16/21
 U/S Today:     24w 3d                                        EDD:   08/30/21
 Best:          23w 5d     Det. By:  U/S C R L  (01/24/21)    EDD:   09/04/21
Anatomy

 Cranium:               Appears normal         LVOT:                   Appears normal
 Cavum:                 Appears normal         Aortic Arch:            Appears normal
 Ventricles:            Appears normal         Ductal Arch:            Appears normal
 Choroid Plexus:        Previously seen        Diaphragm:              Appears normal
 Cerebellum:            Appears normal         Stomach:                Appears normal, left
                                                                       sided
 Posterior Fossa:       Appears normal         Abdominal Wall:         Appears nml (cord
                                                                       insert, abd wall)
 Nuchal Fold:           Not applicable (>20    Cord Vessels:           Appears normal (3
                        wks GA)                                        vessel cord)
 Face:                  Appears normal         Kidneys:                Appear normal
                        (orbits and profile)
 Lips:                  Appears normal         Bladder:                Appears normal
 Thoracic:              Appears normal         Spine:                  Previously seen
 Heart:                 Appears normal         Upper Extremities:      Previously seen
                        (4CH, axis, and
                        situs)
 RVOT:                  Appears normal         Lower Extremities:      Previously seen

 Other:  Fetus appears to be female. VC, 3VV and 3VTV visualized.
Cervix Uterus Adnexa

 Cervix
 Length:           3.57  cm.
 Normal appearance by transabdominal scan.

 Uterus
 Normal shape and size.

 Right Ovary
 Not visualized.

 Left Ovary
 Not visualized.
Impression
 Patient returned for fetal growth assessment.She has chronic
 hypertension and takes labetalol.  Blood pressure today at
 her office is 119/75 mmHg.
 Echogenic bowel was seen at previous ultrasound.  CMV and
 toxoplasmosis serology are negative.

 Fetal growth is appropriate for gestational age.  Amniotic fluid
 is normal and good fetal activity seen.  Fetal bowel appears
 normal.
 I reassured the patient of negative infectious screening work-
 up.

 She is toxoplasmosis nonimmune.  I discussed precautions
 to prevent toxoplasmosis infection in pregnancy.
Recommendations

 -An appointment was made for her to return in 4 weeks for
 fetal growth assessment.
                [place], [place]

## 2022-09-20 DIAGNOSIS — R519 Headache, unspecified: Secondary | ICD-10-CM | POA: Diagnosis not present

## 2022-09-20 DIAGNOSIS — U071 COVID-19: Secondary | ICD-10-CM | POA: Diagnosis not present

## 2022-11-03 ENCOUNTER — Encounter: Payer: Medicaid Other | Admitting: Physician Assistant

## 2022-11-17 ENCOUNTER — Encounter: Payer: Self-pay | Admitting: Physician Assistant

## 2022-11-17 ENCOUNTER — Ambulatory Visit: Payer: Medicaid Other | Admitting: Physician Assistant

## 2022-11-17 VITALS — BP 170/140 | HR 69

## 2022-11-17 DIAGNOSIS — G43009 Migraine without aura, not intractable, without status migrainosus: Secondary | ICD-10-CM

## 2022-11-17 DIAGNOSIS — I10 Essential (primary) hypertension: Secondary | ICD-10-CM

## 2022-11-17 MED ORDER — CYCLOBENZAPRINE HCL 10 MG PO TABS: 10.0000 mg | ORAL_TABLET | Freq: Three times a day (TID) | ORAL | 2 refills | Status: DC | PRN

## 2022-11-17 MED ORDER — METOPROLOL TARTRATE 25 MG PO TABS: 25.0000 mg | ORAL_TABLET | Freq: Two times a day (BID) | ORAL | 3 refills | Status: DC

## 2022-11-17 MED ORDER — NAPROXEN 500 MG PO TABS: 500.0000 mg | ORAL_TABLET | Freq: Two times a day (BID) | ORAL | 2 refills | Status: DC | PRN

## 2022-11-17 MED ORDER — NURTEC 75 MG PO TBDP: 75.0000 mg | ORAL_TABLET | ORAL | 2 refills | Status: DC

## 2022-11-17 MED ORDER — CYCLOBENZAPRINE HCL 10 MG PO TABS
10.0000 mg | ORAL_TABLET | Freq: Three times a day (TID) | ORAL | 2 refills | Status: AC | PRN
Start: 2022-11-17 — End: ?

## 2022-11-17 NOTE — Patient Instructions (Signed)
Migraine Headache A migraine headache is a very strong throbbing pain on one or both sides of your head. This type of headache can also cause other symptoms. It can last from 4 hours to 3 days. Talk with your doctor about what things may bring on (trigger) this condition. What are the causes? The exact cause of a migraine is not known. This condition may be brought on or caused by: Smoking. Medicines, such as: Medicine used to treat chest pain (nitroglycerin). Birth control pills. Estrogen. Some blood pressure medicines. Certain substances in some foods or drinks. Foods and drinks, such as: Cheese. Chocolate. Alcohol. Caffeine. Doing physical activity that is very hard. Other things that may trigger a migraine headache include: Periods. Pregnancy. Hunger. Stress. Getting too much or too little sleep. Weather changes. Feeling tired (fatigue). What increases the risk? Being 25-55 years old. Being female. Having a family history of migraine headaches. Being Caucasian. Having a mental health condition, such as being sad (depressed) or feeling worried or nervous (anxious). Being very overweight (obese). What are the signs or symptoms? A throbbing pain. This pain may: Happen in any area of the head, such as on one or both sides. Make it hard to do daily activities. Get worse with physical activity. Get worse around bright lights, loud noises, or smells. Other symptoms may include: Feeling like you may vomit (nauseous). Vomiting. Dizziness. Before a migraine headache starts, you may get warning signs (an aura). An aura may include: Seeing flashing lights or having blind spots. Seeing bright spots, halos, or zigzag lines. Having tunnel vision or blurred vision. Having numbness or a tingling feeling. Having trouble talking. Having weak muscles. After a migraine ends, you may have symptoms. These may include: Tiredness. Trouble thinking (concentrating). How is this  treated? Taking medicines that: Relieve pain. Relieve the feeling like you may vomit. Prevent migraine headaches. Treatment may also include: Acupuncture. Lifestyle changes like avoiding foods that bring on migraine headaches. Learning ways to control your body functions (biofeedback). Therapy to help you know and deal with negative thoughts (cognitive behavioral therapy). Follow these instructions at home: Medicines Take over-the-counter and prescription medicines only as told by your doctor. If told, take steps to prevent problems with pooping (constipation). You may need to: Drink enough fluid to keep your pee (urine) pale yellow. Take medicines. You will be told what medicines to take. Eat foods that are high in fiber. These include beans, whole grains, and fresh fruits and vegetables. Limit foods that are high in fat and sugar. These include fried or sweet foods. Ask your doctor if you should avoid driving or using machines while you are taking your medicine. Lifestyle  Do not drink alcohol. Do not smoke or use any products that contain nicotine or tobacco. If you need help quitting, ask your doctor. Get 7-9 hours of sleep each night, or the amount recommended by your doctor. Find ways to deal with stress, such as meditation, deep breathing, or yoga. Try to exercise often. This can help lessen how bad and how often your migraines happen. General instructions Keep a journal to find out what may bring on your migraine headaches. This can help you avoid those things. For example, write down: What you eat and drink. How much sleep you get. Any change to your medicines or diet. If you have a migraine headache: Avoid things that make your symptoms worse, such as bright lights. Lie down in a dark, quiet room. Do not drive or use machinery. Ask your   doctor what activities are safe for you. Where to find more information Coalition for Headache and Migraine Patients (CHAMP):  headachemigraine.org American Migraine Foundation: americanmigrainefoundation.org National Headache Foundation: headaches.org Contact a doctor if: You get a migraine headache that is different or worse than others you have had. You have more than 15 days of headaches in one month. Get help right away if: Your migraine headache gets very bad. Your migraine headache lasts more than 72 hours. You have a fever or stiff neck. You have trouble seeing. Your muscles feel weak or like you cannot control them. You lose your balance a lot. You have trouble walking. You faint. You have a seizure. This information is not intended to replace advice given to you by your health care provider. Make sure you discuss any questions you have with your health care provider. Document Revised: 09/26/2021 Document Reviewed: 09/26/2021 Elsevier Patient Education  2024 [employer]  

## 2022-11-17 NOTE — Progress Notes (Signed)
History:  Michele Gardner is a 37 y.o. [phone] who presents to clinic today for headache.  Last visit, we discontinued topiramate which was helping her headache but also causing cognitive issues.  It was stopped as she was having ongoing urinary issues.  She did start Metoprolol and is using 1 tablet daily.  She feels it is helping heart but not headaches. She has not noticed side effects with this medication.  She did not use Nurtec as her insurance denied it stating she had not tried 2 other preventives first.  She has not seen anyone lately regarding blood pressure or prior urinary/kidney issues.  She is extremely stressed with her 6 children and all that entails.   Her headaches are the same quality but she is having them much more often.  They are not controlled.      Past Medical History:  Diagnosis Date   Asthma    Hypertension    Migraine     Social History   Socioeconomic History   Marital status: Single    Spouse name: Not on file   Number of children: Not on file   Years of education: Not on file   Highest education level: Not on file  Occupational History   Not on file  Tobacco Use   Smoking status: Former    Current packs/day: 0.00    Types: Cigarettes    Quit date: 04/23/2015    Years since quitting: 7.5   Smokeless tobacco: Never  Vaping Use   Vaping status: Never Used  Substance and Sexual Activity   Alcohol use: No   Drug use: No   Sexual activity: Yes    Partners: Male    Birth control/protection: None  Other Topics Concern   Not on file  Social History Narrative   Not on file   Social Determinants of Health   Financial Resource Strain: Not on file  Food Insecurity: No Food Insecurity (05/25/2022)   Hunger Vital Sign    Worried About Running Out of Food in the Last Year: Never true    Ran Out of Food in the Last Year: Never true  Transportation Needs: No Transportation Needs (05/25/2022)   PRAPARE - Administrator, Civil Service (Medical):  No    Lack of Transportation (Non-Medical): No  Physical Activity: Not on file  Stress: Not on file  Social Connections: Not on file  Intimate Partner Violence: Not At Risk (05/25/2022)   Humiliation, Afraid, Rape, and Kick questionnaire    Fear of Current or Ex-Partner: No    Emotionally Abused: No    Physically Abused: No    Sexually Abused: No    Family History  Problem Relation Age of Onset   Hypertension Mother    Hyperlipidemia Mother    Varicose Veins Father    Melanoma Father    Alcohol abuse Father    Cancer Maternal Grandfather    Diabetes Paternal Grandmother    Cancer Paternal Grandmother    Diabetes Paternal Grandfather    Brain cancer Other     Allergies  Allergen Reactions   Bee Venom Anaphylaxis    Current Outpatient Medications on File Prior to Visit  Medication Sig Dispense Refill   Blood Pressure Monitoring (BLOOD PRESSURE KIT) DEVI 1 kit by Does not apply route once a week. (Patient not taking: Reported on 06/06/2022) 1 each 0   Butalbital-APAP-Caffeine 50-325-40 MG capsule Take 1-2 capsules by mouth every 6 (six) hours as needed for headache. (  Patient not taking: Reported on 07/28/2022) 40 capsule 2   cyclobenzaprine (FLEXERIL) 10 MG tablet Take 1 tablet (10 mg total) by mouth 3 (three) times daily as needed for muscle spasms (headache). 30 tablet 2   hydrochlorothiazide (HYDRODIURIL) 25 MG tablet Take 1 tablet (25 mg total) by mouth daily. (Patient not taking: Reported on 07/28/2022) 90 tablet 3   ibuprofen (ADVIL) 600 MG tablet Take 1 tablet (600 mg total) by mouth every 6 (six) hours. (Patient not taking: Reported on 06/06/2022) 30 tablet 0   metoprolol tartrate (LOPRESSOR) 25 MG tablet Take 1 tablet (25 mg total) by mouth daily. 30 tablet 2   phenazopyridine (PYRIDIUM) 200 MG tablet Take 1 tablet (200 mg total) by mouth 3 (three) times daily as needed for pain. (Patient not taking: Reported on 07/28/2022) 10 tablet 0   Prenatal Vit-Fe Fumarate-FA (PRENATAL  MULTIVITAMIN) TABS tablet Take 1 tablet by mouth daily.     Rimegepant Sulfate (NURTEC) 75 MG TBDP Take 1 tablet (75 mg total) by mouth every other day. 16 tablet 2   topiramate (TOPAMAX) 25 MG tablet Take 3 tablets (75 mg total) by mouth daily. (Patient taking differently: Take 25 mg by mouth in the morning, at noon, and at bedtime.) 90 tablet 3   valsartan (DIOVAN) 160 MG tablet Take 1 tablet (160 mg total) by mouth daily. 90 tablet 3   No current facility-administered medications on file prior to visit.     Review of Systems:  All pertinent positive/negative included in HPI, all other review of systems are negative   Objective:  Physical Exam BP (!) 170/140   Pulse 69   CONSTITUTIONAL: Well-developed, well-nourished female in no acute distress.  EYES: EOM intact ENT: Normocephalic CARDIOVASCULAR: Regular rate  RESPIRATORY: Normal rate.  MUSCULOSKELETAL: Normal ROM, strength equal bilaterally SKIN: Warm, dry without erythema  NEUROLOGICAL: Alert, oriented, CN II-XII grossly intact, Appropriate balance  PSYCH: Normal behavior, mood   Assessment & Plan:  Assessment: 1. Migraine without aura and without status migrainosus, not intractable   2. Chronic hypertension      Plan: Nurtec every other day for prevention of migraine as well as acute treatment of migraine Metoprolol 25mg  increase to bid for prevention Can trial Zavzpret (sample provided) for breakthrough headache. Flexeril as needed to help with sleep/headache Naprosyn prn to help with more mild migraine SEE PCP/specialist(s) regarding extremely high blood pressure.  ASAP.   Follow-up in 3 months or sooner PRN  47 minutes spent face to face with patient during this encounter.     Bertram Denver, PA-C 11/17/2022 10:14 AM

## 2022-11-21 ENCOUNTER — Ambulatory Visit: Payer: Medicaid Other | Admitting: Cardiology

## 2022-11-22 ENCOUNTER — Encounter: Payer: Self-pay | Admitting: *Deleted

## 2022-11-28 ENCOUNTER — Ambulatory Visit (INDEPENDENT_AMBULATORY_CARE_PROVIDER_SITE_OTHER): Payer: Medicaid Other | Admitting: Student

## 2022-11-28 VITALS — BP 152/104 | HR 98 | Wt 192.0 lb

## 2022-11-28 DIAGNOSIS — I10 Essential (primary) hypertension: Secondary | ICD-10-CM | POA: Diagnosis not present

## 2022-11-28 MED ORDER — VALSARTAN-HYDROCHLOROTHIAZIDE 160-12.5 MG PO TABS
1.0000 | ORAL_TABLET | Freq: Every day | ORAL | 1 refills | Status: DC
Start: 2022-11-28 — End: 2022-12-12

## 2022-11-28 NOTE — Progress Notes (Signed)
    SUBJECTIVE:   CHIEF COMPLAINT / HPI:   Hypertension Patient has chronic hypertension, poorly controlled.  Reports her blood pressures have been elevated at home, as high as 170 systolic.  She occasionally will have headaches when her blood pressure is elevated.  No headache currently.  Reports compliance with valsartan 160 mg daily and metoprolol 25 mg daily.  Was once taking hydrochlorothiazide-triamterene but had discontinued this medication due to noncompliance.  Last seen by cardiology in November 2023.  Reviewing cardiology note, they did consider restarting hydrochlorothiazide/triamterene pending her blood pressure control.  Patient would like to limit her pill burden is much as possible.  Reports amlodipine gives her migraines.  Additionally, patient reports that someone told her eyes look yellow a few days ago and she endorses some worsening fatigue.  PERTINENT  PMH / PSH: Severe preeclampsia, migraines without aura, hypertension, asthma, GERD  OBJECTIVE:   BP (!) 152/104   Pulse 98   Wt 192 lb (87.1 kg)   SpO2 99%   BMI 30.99 kg/m    General: NAD, pleasant HEENT: Normocephalic, atraumatic head. Normal external ear bilaterally. EOM intact and normal conjunctiva BL. Normal external nose. Cardio: RRR, no MRG. Cap Refill <2s. Respiratory: CTAB, normal wob on RA GI: Abdomen is soft, not tender, not distended. BS present Skin: Warm and dry  ASSESSMENT/PLAN:   Assessment & Plan Benign essential hypertension Poorly controlled hypertension with worsening fatigue. Will start additional medication.  Appears she had chronic hypertension, worsened after postpartum.  May benefit from secondary hypertension workup if additional medication is not effective. - Valsartan-hydrochlorothiazide 160-12.5mg  daily - Continue metoprolol - CMP, CBC, TSH - Follow-up with PCP scheduled in approximately 2 weeks - Follow-up with cardiology on 12/26/2022  Tiffany Kocher, DO [facility]

## 2022-11-28 NOTE — Assessment & Plan Note (Signed)
Poorly controlled hypertension with worsening fatigue. Will start additional medication.  Appears she had chronic hypertension, worsened after postpartum.  May benefit from secondary hypertension workup if additional medication is not effective. - Valsartan-hydrochlorothiazide 160-12.5mg  daily - Continue metoprolol - CMP, CBC, TSH - Follow-up with PCP scheduled in approximately 2 weeks - Follow-up with cardiology on 12/26/2022

## 2022-11-28 NOTE — Patient Instructions (Addendum)
It was great to see you! Thank you for allowing me to participate in your care!   I recommend that you always bring your medications to each appointment as this makes it easy to ensure we are on the correct medications and helps [place] not miss when refills are needed.  Our plans for today:  - Take your new combination pill of Valsartan-hydrochlorothiazide (DIOVAN-HCT) - Stop taking your old valsartan - Follow-up in 1 week with Dr. Sherrilee Gilles - Follow-up with your cardiologist  We are checking some labs today, I will call you if they are abnormal will send you a MyChart message or a letter if they are normal.  If you do not hear about your labs in the next 2 weeks please let us know.  Take care and seek immediate care sooner if you develop any concerns. Please remember to show up 15 minutes before your scheduled appointment time!  Tiffany Kocher, DO [facility]

## 2022-11-29 ENCOUNTER — Telehealth: Payer: Self-pay | Admitting: Student

## 2022-11-29 ENCOUNTER — Encounter: Payer: Self-pay | Admitting: Student

## 2022-11-29 LAB — COMPREHENSIVE METABOLIC PANEL
ALT: 13 [IU]/L (ref 0–32)
AST: 15 [IU]/L (ref 0–40)
Albumin: 4.4 g/dL (ref 3.9–4.9)
Alkaline Phosphatase: 110 [IU]/L (ref 44–121)
BUN/Creatinine Ratio: 10 (ref 9–23)
BUN: 8 mg/dL (ref 6–20)
Bilirubin Total: 0.4 mg/dL (ref 0.0–1.2)
CO2: 24 mmol/L (ref 20–29)
Calcium: 9.2 mg/dL (ref 8.7–10.2)
Chloride: 105 mmol/L (ref 96–106)
Creatinine, Ser: 0.77 mg/dL (ref 0.57–1.00)
Globulin, Total: 2.8 g/dL (ref 1.5–4.5)
Glucose: 78 mg/dL (ref 70–99)
Potassium: 4.2 mmol/L (ref 3.5–5.2)
Sodium: 142 mmol/L (ref 134–144)
Total Protein: 7.2 g/dL (ref 6.0–8.5)
eGFR: 104 mL/min/{1.73_m2} (ref >59)

## 2022-11-29 LAB — CBC WITH DIFFERENTIAL/PLATELET
Basophils Absolute: 0.1 10*3/uL (ref 0.0–0.2)
Basos: 1 %
EOS (ABSOLUTE): 0.2 10*3/uL (ref 0.0–0.4)
Eos: 3 %
Hematocrit: 44.3 % (ref 34.0–46.6)
Hemoglobin: 14.7 g/dL (ref 11.1–15.9)
Immature Grans (Abs): 0 10*3/uL (ref 0.0–0.1)
Immature Granulocytes: 0 %
Lymphocytes Absolute: 1.3 10*3/uL (ref 0.7–3.1)
Lymphs: 20 %
MCH: 29.8 pg (ref 26.6–33.0)
MCHC: 33.2 g/dL (ref 31.5–35.7)
MCV: 90 fL (ref 79–97)
Monocytes Absolute: 0.5 10*3/uL (ref 0.1–0.9)
Monocytes: 7 %
Neutrophils Absolute: 4.6 10*3/uL (ref 1.4–7.0)
Neutrophils: 69 %
Platelets: 331 10*3/uL (ref 150–450)
RBC: 4.94 x10E6/uL (ref 3.77–5.28)
RDW: 13.1 % (ref 11.7–15.4)
WBC: 6.7 10*3/uL (ref 3.4–10.8)

## 2022-11-29 LAB — TSH RFX ON ABNORMAL TO FREE T4: TSH: 0.373 u[IU]/mL — ABNORMAL LOW (ref 0.450–4.500)

## 2022-11-29 LAB — T4F: T4,Free (Direct): 1.41 ng/dL (ref 0.82–1.77)

## 2022-11-29 NOTE — Telephone Encounter (Signed)
Attempted to call patient x2. Left HIPAA compliant voicemail.  Will comment through MyChart.

## 2022-12-01 ENCOUNTER — Other Ambulatory Visit: Payer: Self-pay | Admitting: Physician Assistant

## 2022-12-01 MED ORDER — EMGALITY 120 MG/ML ~~LOC~~ SOAJ: SUBCUTANEOUS | 11 refills | Status: AC

## 2022-12-01 MED ORDER — NURTEC 75 MG PO TBDP: 75.0000 mg | ORAL_TABLET | ORAL | 11 refills | Status: AC | PRN

## 2022-12-05 ENCOUNTER — Encounter (HOSPITAL_COMMUNITY): Payer: Self-pay | Admitting: Emergency Medicine

## 2022-12-05 ENCOUNTER — Emergency Department (HOSPITAL_COMMUNITY): Payer: Medicaid Other

## 2022-12-05 ENCOUNTER — Other Ambulatory Visit: Payer: Self-pay

## 2022-12-05 ENCOUNTER — Emergency Department (HOSPITAL_COMMUNITY)
Admission: EM | Admit: 2022-12-05 | Discharge: 2022-12-05 | Disposition: A | Discharge status: Home / Self Care | Payer: Medicaid Other | Attending: Emergency Medicine | Admitting: Emergency Medicine

## 2022-12-05 DIAGNOSIS — G43809 Other migraine, not intractable, without status migrainosus: Secondary | ICD-10-CM

## 2022-12-05 DIAGNOSIS — I10 Essential (primary) hypertension: Secondary | ICD-10-CM | POA: Diagnosis not present

## 2022-12-05 DIAGNOSIS — G43909 Migraine, unspecified, not intractable, without status migrainosus: Secondary | ICD-10-CM | POA: Diagnosis present

## 2022-12-05 DIAGNOSIS — R7309 Other abnormal glucose: Secondary | ICD-10-CM | POA: Diagnosis not present

## 2022-12-05 LAB — I-STAT CHEM 8, ED
BUN: 13 mg/dL (ref 6–20)
Calcium, Ion: 1.08 mmol/L — ABNORMAL LOW (ref 1.15–1.40)
Chloride: 103 mmol/L (ref 98–111)
Creatinine, Ser: 0.8 mg/dL (ref 0.44–1.00)
Glucose, Bld: 89 mg/dL (ref 70–99)
HCT: 42 % (ref 36.0–46.0)
Hemoglobin: 14.3 g/dL (ref 12.0–15.0)
Potassium: 3.6 mmol/L (ref 3.5–5.1)
Sodium: 139 mmol/L (ref 135–145)
TCO2: 24 mmol/L (ref 22–32)

## 2022-12-05 LAB — CBC WITH DIFFERENTIAL/PLATELET
Abs Immature Granulocytes: 0.02 10*3/uL (ref 0.00–0.07)
Basophils Absolute: 0.1 10*3/uL (ref 0.0–0.1)
Basophils Relative: 1 %
Eosinophils Absolute: 0.2 10*3/uL (ref 0.0–0.5)
Eosinophils Relative: 3 %
HCT: 42.4 % (ref 36.0–46.0)
Hemoglobin: 14.3 g/dL (ref 12.0–15.0)
Immature Granulocytes: 0 %
Lymphocytes Relative: 19 %
Lymphs Abs: 1.3 10*3/uL (ref 0.7–4.0)
MCH: 29.6 pg (ref 26.0–34.0)
MCHC: 33.7 g/dL (ref 30.0–36.0)
MCV: 87.8 fL (ref 80.0–100.0)
Monocytes Absolute: 0.5 10*3/uL (ref 0.1–1.0)
Monocytes Relative: 7 %
Neutro Abs: 5 10*3/uL (ref 1.7–7.7)
Neutrophils Relative %: 70 %
Platelets: 310 10*3/uL (ref 150–400)
RBC: 4.83 MIL/uL (ref 3.87–5.11)
RDW: 12.6 % (ref 11.5–15.5)
WBC: 7.2 10*3/uL (ref 4.0–10.5)
nRBC: 0 % (ref 0.0–0.2)

## 2022-12-05 LAB — CBG MONITORING, ED: Glucose-Capillary: 97 mg/dL (ref 70–99)

## 2022-12-05 MED ORDER — DIPHENHYDRAMINE HCL 50 MG/ML IJ SOLN
12.5000 mg | Freq: Once | INTRAMUSCULAR | Status: AC
  Administered 2022-12-05: 12.5 mg via INTRAVENOUS
  Filled 2022-12-05: qty 1

## 2022-12-05 MED ORDER — METOCLOPRAMIDE HCL 5 MG/ML IJ SOLN
5.0000 mg | Freq: Once | INTRAMUSCULAR | Status: AC
  Administered 2022-12-05: 5 mg via INTRAVENOUS
  Filled 2022-12-05: qty 2

## 2022-12-05 NOTE — ED Notes (Signed)
Pt d/c home per EDP order. Discharge summary reviewed, pt verbalizes understanding. NAD.

## 2022-12-05 NOTE — ED Notes (Signed)
RN taken patient back to room and informed EDP of patients symptoms and time onset. EDP states will see patient.

## 2022-12-05 NOTE — ED Provider Notes (Signed)
[facility] EMERGENCY DEPARTMENT AT [facility] Provider Note   CSN: [id number] Arrival date & time: 12/05/22  1129     History  Chief Complaint  Patient presents with   Numbness   Eye Problem    Michele Gardner is a 37 y.o. female.  37 year old female with history of migraines presents with sudden onset of right-sided headache which began this morning.  Headache is characterized as sharp and on the right side of her head.  History of migraines and this feels somewhat similar.  Notes some flashing lights in the right side of her vision.  Notes paresthesias that started on the right side of her face and went to the left side.  Denies any vision loss.  No weakness in her arms or legs.  No gait disturbance.  No fever, nausea, photophobia.  No treatment use prior to arrival       Home Medications Prior to Admission medications   Medication Sig Start Date End Date Taking? Authorizing Provider  Blood Pressure Monitoring (BLOOD PRESSURE KIT) DEVI 1 kit by Does not apply route once a week. Patient not taking: Reported on 06/06/2022 01/24/21   Constant, Peggy, MD  Butalbital-APAP-Caffeine 50-325-40 MG capsule Take 1-2 capsules by mouth every 6 (six) hours as needed for headache. Patient not taking: Reported on 07/28/2022 04/29/21   Glyn Ade, Scot Jun, PA-C  cyclobenzaprine (FLEXERIL) 10 MG tablet Take 1 tablet (10 mg total) by mouth 3 (three) times daily as needed for muscle spasms (headache). 11/17/22   Teague Chestine Spore, Scot Jun, PA-C  Galcanezumab-gnlm (EMGALITY) 120 MG/ML SOAJ Inject 240 mg into the skin as directed AND 120 mg every 30 (thirty) days. Inj 240mg  once then 120mg  monthly. 12/01/22   Glyn Ade, Scot Jun, PA-C  ibuprofen (ADVIL) 600 MG tablet Take 1 tablet (600 mg total) by mouth every 6 (six) hours. Patient not taking: Reported on 06/06/2022 08/11/21   Reva Bores, MD  metoprolol tartrate (LOPRESSOR) 25 MG tablet Take 1 tablet (25 mg total) by mouth 2 (two) times  daily. 11/17/22   Glyn Ade, Scot Jun, PA-C  naproxen (NAPROSYN) 500 MG tablet Take 1 tablet (500 mg total) by mouth 2 (two) times daily as needed (migraine). 11/17/22   Bertram Denver, PA-C  Prenatal Vit-Fe Fumarate-FA (PRENATAL MULTIVITAMIN) TABS tablet Take 1 tablet by mouth daily.    [provider]  Rimegepant Sulfate (NURTEC) 75 MG TBDP Take 1 tablet (75 mg total) by mouth as needed (migraine). 12/01/22   Teague Chestine Spore, Scot Jun, PA-C  topiramate (TOPAMAX) 25 MG tablet Take 3 tablets (75 mg total) by mouth daily. Patient taking differently: Take 25 mg by mouth in the morning, at noon, and at bedtime. 11/11/21   Glyn Ade, Scot Jun, PA-C  valsartan-hydrochlorothiazide (DIOVAN-HCT) 160-12.5 MG tablet Take 1 tablet by mouth daily. 11/28/22   Tiffany Kocher, DO      Allergies    Bee venom    Review of Systems   Review of Systems  All other systems reviewed and are negative.   Physical Exam Updated Vital Signs BP (!) 176/128 (BP Location: Left Arm)   Pulse 60   Temp 98.2 F (36.8 C) (Oral)   Resp 18   Ht 1.676 m (5\' 6" )   Wt 89.8 kg   SpO2 99%   BMI 31.96 kg/m  Physical Exam Vitals and nursing note reviewed.  Constitutional:      General: She is not in acute distress.  Appearance: Normal appearance. She is well-developed. She is not toxic-appearing.  HENT:     Head: Normocephalic and atraumatic.  Eyes:     General: Lids are normal.     Conjunctiva/sclera: Conjunctivae normal.     Pupils: Pupils are equal, round, and reactive to light.  Neck:     Thyroid: No thyroid mass.     Trachea: No tracheal deviation.  Cardiovascular:     Rate and Rhythm: Normal rate and regular rhythm.     Heart sounds: Normal heart sounds. No murmur heard.    No gallop.  Pulmonary:     Effort: Pulmonary effort is normal. No respiratory distress.     Breath sounds: Normal breath sounds. No stridor. No decreased breath sounds, wheezing, rhonchi or rales.  Abdominal:      General: There is no distension.     Palpations: Abdomen is soft.     Tenderness: There is no abdominal tenderness. There is no rebound.  Musculoskeletal:        General: No tenderness. Normal range of motion.     Cervical back: Normal range of motion and neck supple.  Skin:    General: Skin is warm and dry.     Findings: No abrasion or rash.  Neurological:     General: No focal deficit present.     Mental Status: She is alert and oriented to person, place, and time. Mental status is at baseline.     GCS: GCS eye subscore is 4. GCS verbal subscore is 5. GCS motor subscore is 6.     Cranial Nerves: Cranial nerves are intact. No cranial nerve deficit.     Sensory: No sensory deficit.     Motor: Motor function is intact.     Coordination: Coordination is intact.     Gait: Gait is intact.  Psychiatric:        Attention and Perception: Attention normal.        Speech: Speech normal.        Behavior: Behavior normal.     ED Results / Procedures / Treatments   Labs (all labs ordered are listed, but only abnormal results are displayed) Labs Reviewed  CBC WITH DIFFERENTIAL/PLATELET  CBG MONITORING, ED  I-STAT CHEM 8, ED    EKG None  Radiology No results found.  Procedures Procedures    Medications Ordered in ED Medications  metoCLOPramide (REGLAN) injection 5 mg (has no administration in time range)  diphenhydrAMINE (BENADRYL) injection 12.5 mg (has no administration in time range)    ED Course/ Medical Decision Making/ A&P                                 Medical Decision Making Amount and/or Complexity of Data Reviewed Labs: ordered. Radiology: ordered. ECG/medicine tests: ordered.  Risk Prescription drug management.   Patient's migraine treated here and she feels much better.  Suspect this was complex in nature.  Neurological exam is completely resolved.  No suspicion for TIA.  Head CT performed per interpretation showed no acute findings.  Patient to be  discharged        Final Clinical Impression(s) / ED Diagnoses Final diagnoses:  None    Rx / DC Orders ED Discharge Orders     None         Lorre Nick, MD 12/05/22 1505

## 2022-12-05 NOTE — ED Triage Notes (Signed)
Patient arrives ambulatory by POV c/o sharp head pain around 10 am this morning. States she suffers from migraines. Reports numbness to left side of face and vision changes to right eye, states she can't focus with right eye around 10:30 am. Patient also has decreased sensation to left arm and left leg.

## 2022-12-06 ENCOUNTER — Encounter: Payer: Self-pay | Admitting: *Deleted

## 2022-12-07 ENCOUNTER — Ambulatory Visit: Payer: Self-pay | Admitting: Family Medicine

## 2022-12-10 DIAGNOSIS — R519 Headache, unspecified: Secondary | ICD-10-CM | POA: Diagnosis not present

## 2022-12-11 ENCOUNTER — Other Ambulatory Visit: Payer: Self-pay

## 2022-12-11 ENCOUNTER — Ambulatory Visit: Payer: Self-pay

## 2022-12-11 ENCOUNTER — Telehealth: Payer: Self-pay | Admitting: Student

## 2022-12-11 ENCOUNTER — Emergency Department (HOSPITAL_COMMUNITY)
Admission: EM | Admit: 2022-12-11 | Discharge: 2022-12-11 | Disposition: A | Discharge status: Home / Self Care | Payer: Medicaid Other | Attending: Emergency Medicine | Admitting: Emergency Medicine

## 2022-12-11 ENCOUNTER — Encounter (HOSPITAL_COMMUNITY): Payer: Self-pay

## 2022-12-11 DIAGNOSIS — R519 Headache, unspecified: Secondary | ICD-10-CM | POA: Diagnosis not present

## 2022-12-11 MED ORDER — METOCLOPRAMIDE HCL 5 MG/ML IJ SOLN
10.0000 mg | Freq: Once | INTRAMUSCULAR | Status: AC
  Administered 2022-12-11: 10 mg via INTRAVENOUS
  Filled 2022-12-11: qty 2

## 2022-12-11 MED ORDER — DIPHENHYDRAMINE HCL 50 MG/ML IJ SOLN
25.0000 mg | Freq: Once | INTRAMUSCULAR | Status: AC
  Administered 2022-12-11: 25 mg via INTRAVENOUS
  Filled 2022-12-11: qty 1

## 2022-12-11 MED ORDER — KETOROLAC TROMETHAMINE 30 MG/ML IJ SOLN
30.0000 mg | Freq: Once | INTRAMUSCULAR | Status: AC
Start: 2022-12-11 — End: 2022-12-11
  Administered 2022-12-11: 30 mg via INTRAVENOUS
  Filled 2022-12-11: qty 1

## 2022-12-11 NOTE — Telephone Encounter (Signed)
**  After Hours/ Emergency Line Call**  Received a call to report that Michele Gardner c/o ongoing migraine, right sided.  Endorsing headache ongoing for 6 days, no weakness in the arms or legs, dysarthria, change in ambulation, confusion. Patient reports that she has been seen in urgent care and ED over the course of the last six days. Has tried migraine cocktail, Nurtec, OTC analgesia, Excedrin, Flexeril, Benadryl, Reglan without any relief. Head CT at ED negative. I worry as this headache is not like previous migraines and is unrelenting despite multiple treatments x6 days. Discussed this in addition to severely elevated bp to 170s/100s warrants further assessment again in ED. Patient amenable to this. Made appt with ATC in AM in case she is unable to be seen in ED for any reason.  Red flags discussed.  Will forward to PCP.  Alfredo Martinez, MD  PGY-3, [facility] 12/11/2022 12:36 AM

## 2022-12-11 NOTE — ED Triage Notes (Signed)
Pt presents via POV c/o headache since Tuesday, Reports given Reglan given but did not impvoed. Reports seen Urgent Care yesterday and given Toradol yesterday without improvement.

## 2022-12-11 NOTE — ED Provider Notes (Signed)
[facility]-EMERGENCY DEPT [facility] Emergency Department Provider Note MRN:  [id number]  Arrival date & time: 12/11/22     Chief Complaint   Headache   History of Present Illness   Michele Gardner is a 36 y.o. year-old female presents to the ED with chief complaint of headaches.  Reports hx of migraines.  States that she takes Nurtec and had been on Topomax, but hasn't had any relief.  She follows with neuro allergy.  She denies fevers or chills.  She denies pregnancy or breast-feeding.  Reports chronic hypertension that is not well-controlled.  Denies any numbness, weakness, or tingling.  She reports associated photophobia.  States that this feels like her typical migraine, but just will not go away.  History provided by patient.   Review of Systems  Pertinent positive and negative review of systems noted in HPI.    Physical Exam   Vitals:   12/11/22 0304 12/11/22 0445  BP: (!) 170/118 (!) 165/116  Pulse: 87 79  Resp: 13 16  Temp: 97.9 F (36.6 C)   SpO2: 97% 97%    CONSTITUTIONAL:  non toxic-appearing, NAD NEURO:  Alert and oriented x 3, CN 3-12 grossly intact EYES:  eyes equal and reactive ENT/NECK:  Supple, no stridor, no neck stiffness or meningismus CARDIO:  normal rate, appears well-perfused  PULM:  No respiratory distress,  GI/GU:  non-distended,  MSK/SPINE:  No gross deformities, no edema, moves all extremities  SKIN:  no rash, atraumatic   *Additional and/or pertinent findings included in MDM below  Diagnostic and Interventional Summary    EKG Interpretation Date/Time:    Ventricular Rate:    PR Interval:    QRS Duration:    QT Interval:    QTC Calculation:   R Axis:      Text Interpretation:         Labs Reviewed - No data to display   No orders to display    Medications  ketorolac (TORADOL) 30 MG/ML injection 30 mg (30 mg Intravenous Given 12/11/22 0437)  metoCLOPramide (REGLAN) injection 10 mg (10 mg Intravenous Given 12/11/22 0437)   diphenhydrAMINE (BENADRYL) injection 25 mg (25 mg Intravenous Given 12/11/22 0437)     Procedures  /  Critical Care Procedures  ED Course and Medical Decision Making  I have reviewed the triage vital signs, the nursing notes, and pertinent available records from the EMR.  Social Determinants Affecting Complexity of Care: Patient has no clinically significant social determinants affecting this chief complaint..   ED Course:    Medical Decision Making Pt HA treated and improved while in ED.  Presentation is like pts typical HA and non concerning for [facility], ICH, Meningitis, or temporal arteritis. Pt is afebrile with no focal neuro deficits, nuchal rigidity, or change in vision. Pt is to follow up with PCP to discuss prophylactic medication. Pt verbalizes understanding and is agreeable with plan to dc.    Risk Prescription drug management.         Consultants: No consultations were needed in caring for this patient.   Treatment and Plan: Emergency department workup does not suggest an emergent condition requiring admission or immediate intervention beyond  what has been performed at this time. The patient is safe for discharge and has  been instructed to return immediately for worsening symptoms, change in  symptoms or any other concerns    Final Clinical Impressions(s) / ED Diagnoses     ICD-10-CM   1. Acute nonintractable headache, unspecified headache type  R51.9       ED Discharge Orders     None         Discharge Instructions Discussed with and Provided to Patient:     Discharge Instructions      Please follow-up with your neurologist.  If symptoms change or worsen, please return.       Roxy Horseman, PA-C 12/11/22 4098    Gilda Crease, MD 12/16/22 [phone]

## 2022-12-11 NOTE — Discharge Instructions (Signed)
Please follow-up with your neurologist.  If symptoms change or worsen, please return.

## 2022-12-12 ENCOUNTER — Ambulatory Visit: Payer: Medicaid Other | Admitting: Student

## 2022-12-12 VITALS — BP 164/118 | HR 70 | Ht 66.0 in | Wt 198.0 lb

## 2022-12-12 DIAGNOSIS — I10 Essential (primary) hypertension: Secondary | ICD-10-CM

## 2022-12-12 DIAGNOSIS — G43901 Migraine, unspecified, not intractable, with status migrainosus: Secondary | ICD-10-CM | POA: Diagnosis present

## 2022-12-12 DIAGNOSIS — Z975 Presence of (intrauterine) contraceptive device: Secondary | ICD-10-CM | POA: Insufficient documentation

## 2022-12-12 MED ORDER — VALSARTAN-HYDROCHLOROTHIAZIDE 320-25 MG PO TABS: 1.0000 | ORAL_TABLET | Freq: Every day | ORAL | 0 refills | Status: DC

## 2022-12-12 NOTE — Progress Notes (Signed)
  SUBJECTIVE:   CHIEF COMPLAINT / HPI:   Presents today for Migraine prophylaxis.  Today is day 7 of her migraine.  She went to the ED yesterday and received migraine cocktail in addition to Toradol injection.  She endorses continuous migraine in which she does see neurology and has appointment with them on Friday.  She takes Topamax, metoprolol, Flexeril, Nurtec.  She has not yet started her Emgality.  She states she is been on almost everything for migraines however nothing seems to be beneficial.  She has also had injections in her neck to help with her migraines but cannot recall if these are Botox injections.  She has Phenergan at home which has not been beneficial, states Zofran gives her migraines and Phenergan is not beneficial either.  PERTINENT  PMH / PSH: Migraines, HTN, GERD, asthma  OBJECTIVE:  BP (!) 164/118   Pulse 70   Ht 5\' 6"  (1.676 m)   Wt 198 lb (89.8 kg)   LMP 12/04/2022 (Approximate)   SpO2 100%   BMI 31.96 kg/m  General: Well-appearing, NAD Eyes: EOMI, PERRL Neuro: Appropriately conversational, notable facial droop appreciable, no focal neurological deficits Psych: Normal mood and affect  ASSESSMENT/PLAN:   Assessment & Plan Status migrainosus Unfortunately, I do not have anything else that I can offer her regarding her status migrainosus.  I am hesitant to give her further NSAIDs given her recent Toradol injection especially if it was not beneficial.  Likely she has appointment on Friday with neurology.  Recommend she discuss Botox injections with them and adherence to Emgality versus transitioning to another medication such as Aimovig.  I do hope that with better blood pressure control will help her migraines. Benign essential hypertension BP: (!) 164/118 today. Poorly controlled. Goal of <130/80. Continue to work on healthy dietary habits and exercise. Follow up in 1 month.  BMP check in 1 week. Medication regimen: Increase to valsartan-HCTZ 320 mg - 25 mg    Return in about 1 month (around 01/12/2023) for HTN f/u. Michele Mattocks, DO 12/12/2022, 4:05 PM PGY-3, [facility]

## 2022-12-12 NOTE — Assessment & Plan Note (Addendum)
BP: (!) 164/118 today. Poorly controlled. Goal of <130/80. Continue to work on healthy dietary habits and exercise. Follow up in 1 month.  BMP check in 1 week. Medication regimen: Increase to valsartan-HCTZ 320 mg - 25 mg

## 2022-12-12 NOTE — Patient Instructions (Addendum)
It was great to see you today! Thank you for choosing [facility] for your primary care.  Today we addressed: Please consider discussing further medications or Botox injections regarding her migraines with your neurologist on Friday. I am increasing her blood pressure medications.  We will get this under control.  Please follow-up in 1 week for a lab visit and follow-up with me in 1 month for your blood pressure.  If you haven't already, sign up for My Chart to have easy access to your labs results, and communication with your primary care physician.  Please arrive 15 minutes before your appointment to ensure smooth check in process.  We appreciate your efforts in making this happen.  Thank you for allowing me to participate in your care, Shelby Mattocks, DO 12/12/2022, 12:32 PM PGY-3, [facility]

## 2022-12-12 NOTE — Assessment & Plan Note (Addendum)
Unfortunately, I do not have anything else that I can offer her regarding her status migrainosus.  I am hesitant to give her further NSAIDs given her recent Toradol injection especially if it was not beneficial.  Likely she has appointment on Friday with neurology.  Recommend she discuss Botox injections with them and adherence to Emgality versus transitioning to another medication such as Aimovig.  I do hope that with better blood pressure control will help her migraines.

## 2022-12-13 ENCOUNTER — Emergency Department (HOSPITAL_COMMUNITY): Payer: Medicaid Other

## 2022-12-13 ENCOUNTER — Telehealth: Payer: Self-pay | Admitting: Student

## 2022-12-13 ENCOUNTER — Emergency Department (HOSPITAL_COMMUNITY)
Admission: EM | Admit: 2022-12-13 | Discharge: 2022-12-13 | Disposition: A | Discharge status: Home / Self Care | Payer: Medicaid Other | Attending: Emergency Medicine | Admitting: Emergency Medicine

## 2022-12-13 ENCOUNTER — Other Ambulatory Visit: Payer: Self-pay

## 2022-12-13 DIAGNOSIS — R519 Headache, unspecified: Secondary | ICD-10-CM | POA: Insufficient documentation

## 2022-12-13 DIAGNOSIS — R197 Diarrhea, unspecified: Secondary | ICD-10-CM | POA: Diagnosis not present

## 2022-12-13 DIAGNOSIS — I10 Essential (primary) hypertension: Secondary | ICD-10-CM | POA: Diagnosis not present

## 2022-12-13 DIAGNOSIS — Z79899 Other long term (current) drug therapy: Secondary | ICD-10-CM | POA: Diagnosis not present

## 2022-12-13 DIAGNOSIS — R110 Nausea: Secondary | ICD-10-CM | POA: Insufficient documentation

## 2022-12-13 DIAGNOSIS — H53149 Visual discomfort, unspecified: Secondary | ICD-10-CM | POA: Insufficient documentation

## 2022-12-13 DIAGNOSIS — R6883 Chills (without fever): Secondary | ICD-10-CM | POA: Insufficient documentation

## 2022-12-13 DIAGNOSIS — G43001 Migraine without aura, not intractable, with status migrainosus: Secondary | ICD-10-CM

## 2022-12-13 DIAGNOSIS — G43009 Migraine without aura, not intractable, without status migrainosus: Secondary | ICD-10-CM | POA: Diagnosis not present

## 2022-12-13 LAB — URINALYSIS, ROUTINE W REFLEX MICROSCOPIC
Bilirubin Urine: NEGATIVE
Glucose, UA: NEGATIVE mg/dL
Ketones, ur: NEGATIVE mg/dL
Leukocytes,Ua: NEGATIVE
Nitrite: NEGATIVE
Protein, ur: NEGATIVE mg/dL
Specific Gravity, Urine: 1.008 (ref 1.005–1.030)
pH: 7 (ref 5.0–8.0)

## 2022-12-13 LAB — CSF CELL COUNT WITH DIFFERENTIAL
RBC Count, CSF: 0 /mm3
Tube #: 3
WBC, CSF: 1 /mm3 (ref 0–5)

## 2022-12-13 LAB — COMPREHENSIVE METABOLIC PANEL
ALT: 21 U/L (ref 0–44)
AST: 20 U/L (ref 15–41)
Albumin: 3.8 g/dL (ref 3.5–5.0)
Alkaline Phosphatase: 81 U/L (ref 38–126)
Anion gap: 10 (ref 5–15)
BUN: 12 mg/dL (ref 6–20)
CO2: 26 mmol/L (ref 22–32)
Calcium: 8.7 mg/dL — ABNORMAL LOW (ref 8.9–10.3)
Chloride: 102 mmol/L (ref 98–111)
Creatinine, Ser: 0.82 mg/dL (ref 0.44–1.00)
GFR, Estimated: >60 mL/min (ref >60)
Glucose, Bld: 96 mg/dL (ref 70–99)
Potassium: 3.4 mmol/L — ABNORMAL LOW (ref 3.5–5.1)
Sodium: 138 mmol/L (ref 135–145)
Total Bilirubin: 0.5 mg/dL (ref 0.3–1.2)
Total Protein: 7 g/dL (ref 6.5–8.1)

## 2022-12-13 LAB — CBC WITH DIFFERENTIAL/PLATELET
Abs Immature Granulocytes: 0.04 10*3/uL (ref 0.00–0.07)
Basophils Absolute: 0.1 10*3/uL (ref 0.0–0.1)
Basophils Relative: 1 %
Eosinophils Absolute: 0.3 10*3/uL (ref 0.0–0.5)
Eosinophils Relative: 3 %
HCT: 42.2 % (ref 36.0–46.0)
Hemoglobin: 14.3 g/dL (ref 12.0–15.0)
Immature Granulocytes: 0 %
Lymphocytes Relative: 20 %
Lymphs Abs: 2 10*3/uL (ref 0.7–4.0)
MCH: 29.1 pg (ref 26.0–34.0)
MCHC: 33.9 g/dL (ref 30.0–36.0)
MCV: 85.8 fL (ref 80.0–100.0)
Monocytes Absolute: 0.9 10*3/uL (ref 0.1–1.0)
Monocytes Relative: 9 %
Neutro Abs: 6.5 10*3/uL (ref 1.7–7.7)
Neutrophils Relative %: 67 %
Platelets: 300 10*3/uL (ref 150–400)
RBC: 4.92 MIL/uL (ref 3.87–5.11)
RDW: 12.6 % (ref 11.5–15.5)
WBC: 9.7 10*3/uL (ref 4.0–10.5)
nRBC: 0 % (ref 0.0–0.2)

## 2022-12-13 LAB — MAGNESIUM: Magnesium: 1.8 mg/dL (ref 1.7–2.4)

## 2022-12-13 LAB — PROTEIN AND GLUCOSE, CSF
Glucose, CSF: 53 mg/dL (ref 40–70)
Total  Protein, CSF: 24 mg/dL (ref 15–45)

## 2022-12-13 LAB — HCG, SERUM, QUALITATIVE: Preg, Serum: NEGATIVE

## 2022-12-13 MED ORDER — VALPROATE SODIUM 100 MG/ML IV SOLN
1000.0000 mg | Freq: Once | INTRAVENOUS | Status: AC
  Administered 2022-12-13: 1000 mg via INTRAVENOUS
  Filled 2022-12-13: qty 10

## 2022-12-13 MED ORDER — GADOBUTROL 1 MMOL/ML IV SOLN
8.0000 mL | Freq: Once | INTRAVENOUS | Status: AC | PRN
  Administered 2022-12-13: 8 mL via INTRAVENOUS

## 2022-12-13 MED ORDER — LIDOCAINE HCL (PF) 1 % IJ SOLN
5.0000 mL | Freq: Once | INTRAMUSCULAR | Status: AC
  Administered 2022-12-13: 3 mL via INTRADERMAL

## 2022-12-13 NOTE — Telephone Encounter (Signed)
Left message inquiring about patient's name of neurologist.  She stated she had an appointment with them however I see that her OB/GYN PA has been managing her migraines.  Request that she advise me on who her neurologist is and whether she needs a formal referral to neurology for her migraines.

## 2022-12-13 NOTE — ED Triage Notes (Addendum)
Pt. Stated, Michele Gardner had a migraine since last Tuesday. I went to [facility] on Oct 22 and yesterday and nothing they gave me has worked. Its the worse migraine Ive had. I have nausea, diarreha with this, I can't even take carew of my kids.  Today Ive only took my BP med today.

## 2022-12-13 NOTE — ED Provider Notes (Signed)
History of migraine, but now with left sided migraine different from usual over the last couple of days. Awaiting LP.  Physical Exam  BP (!) 164/110   Pulse 64   Temp 98.2 F (36.8 C) (Oral)   Resp 18   LMP 12/04/2022 (Approximate)   SpO2 99%   Physical Exam Vitals and nursing note reviewed.  Constitutional:      Appearance: Normal appearance.     Comments: Well-appearing, talking at normal volume. No emesis  HENT:     Head: Atraumatic.  Eyes:     Extraocular Movements: Extraocular movements intact.     Pupils: Pupils are equal, round, and reactive to light.  Cardiovascular:     Rate and Rhythm: Normal rate and regular rhythm.  Pulmonary:     Effort: Pulmonary effort is normal.  Neurological:     General: No focal deficit present.     Mental Status: She is alert.  Psychiatric:        Mood and Affect: Mood normal.        Behavior: Behavior normal.     Procedures  Procedures  ED Course / MDM   Clinical Course as of 12/13/22 1910  Wed Dec 13, 2022  1425 Awaiting call back from IR.  [RR]  1435 IR, Aimee Saint Martin PA, reports they will be getting her shortly for LP.  [RR]    Clinical Course User Index [RR] Achille Rich, PA-C   Medical Decision Making Amount and/or Complexity of Data Reviewed Labs: ordered. Radiology: ordered.  Risk Prescription drug management.   In short, patient presenting with a left-sided occipital headache that has been ongoing for the past 9 days.  Has been seen in the ER twice already for this headache and reports the treatment she has received did not help.  Given the persistent headache, MRI brain, MRV head was obtained by the previous provider.  These were unremarkable.  Low suspicion for any intracranial abnormality at this time.  A lumbar puncture was then performed, CSF with a white blood cell count of 1, glucose and protein with minimal limits.  No concern for infection at this time.  Patient CBC without leukocytosis, patient afebrile, no  nuchal rigidity, low suspicion for meningitis at this time.  Patient was given valproate by the previous provider here in the ER, reports this did not helped her headache much.   Patient has a follow-up appointment with her neurologist in 2 days.  She was encouraged to attend this appointment for further management of her migraines.  Patient well-appearing and stable for discharge at this time.  Return precautions given.       Michele Merles, PA-C 12/13/22 1910    Rondel Baton, MD 12/14/22 [phone]

## 2022-12-13 NOTE — Procedures (Signed)
PROCEDURE SUMMARY:  Successful image-guided lumbar puncture at level of L2-3.  Opening pressure 17 cm water.  Yielded 8.5 mL of clear, colorless CSF.  Closing pressure 13 cm water.  No immediate complications.  EBL = trace. Patient tolerated well.   Specimen was sent for labs.  Please see imaging section of Epic for full dictation.   Lynann Bologna Maddock Finigan PA-C 12/13/2022 3:54 PM

## 2022-12-13 NOTE — Discharge Instructions (Addendum)
Your workup here today is reassuring.  Your MRI of the brain showed no abnormalities.  Your lumbar puncture today shows no signs of an infection.  Please follow-up with your neurologist at your appointment on Friday for further management of your migraine.  Return to the ER for any sudden worsening of your headache, headache that changes in intensity with position, changes in vision, fever, any other new or concerning symptoms.

## 2022-12-13 NOTE — ED Notes (Signed)
Pt tx to MRI

## 2022-12-13 NOTE — Plan of Care (Signed)
  These are curbside recommendations based upon the information readily available in the chart on brief review as well as history and examination information provided to me by requesting provider and do not replace a full detailed consult  Recommendations regarding headache management requested by PA Achille Rich  Patient is reporting her headache is not a typical headache pain, she has baseline uncontrolled hypertension but this is more severe; unclear whether the hypertension is driving her headache or if she is having increased blood pressure secondary to pain.   Recommend MRI brain with and without contrast to rule out PRES/RCVS or inflammatory processes  Recommend clarification of whether of there is any positional nature to her headaches as she is reporting some diplopia and vision changes; specifically if the headache is worse with laying down and improves with standing/sitting up, then would recommend MRV with contrast as well, in addition to lumbar puncture with opening pressure and basic studies (protein, glucose, cell counts, bacterial smear and culture)  Reportedly does not tolerate calcium channel blockers due to worsening headache  Conditionally trial Depakote 1 g; has outpatient appointment with her neurologist on Friday at which time they can consider other interventions such as nerve block, Botox etc  Would avoid prednisone given her BP issues  These are curbside recommendations based upon the information readily available in the chart on brief review as well as history and examination information provided to me by requesting provider and do not replace a full detailed consult  Brooke Dare MD-PhD [facility] [phone]  Available 7 AM to 7 PM, outside these hours please contact Neurologist on call listed on AMION

## 2022-12-13 NOTE — ED Provider Notes (Cosign Needed Addendum)
[place] EMERGENCY DEPARTMENT AT [facility] Provider Note   CSN: [id number] Arrival date & time: 12/13/22  5284     History Chief Complaint  Patient presents with   Headache   Nausea   Chills   Diarrhea   Migraine    Michele Gardner is a 37 y.o. female with h/o uncontrolled hypertension and migraines presents emergency department today for evaluation of continued migraine headache since 12-05-2022.  She is not seen at the emergency department twice and also been seen at her primary care doctor in urgent care for the symptoms with out any resolution.  She reports that she feels like it is on her left side and goes behind her eye and wraps around through the crown and into the occiput.  She reports it feels like a tension and burning sensation.  She reports that this pain is worse than her typical migraine is lasted longer than they usually do.  Reports some photophobia.  She reports that while driving today she felt like she had about 10 minutes of diplopia, unsure if it was out of both of her eyes or unilaterally.  She reports that this lasted around 10 minutes but was happen while she was driving and squinting at the road.  She reports that none of the migraine cocktails are helping her.  She denies any fevers reports she had some chills.  She reports that this feels different than her typical migraines. She denies any fevers, neck pain or nuchal rigidity. She denies any rhinorrhea, nasal congestion, or sore throat.  Distally, she mentions that she has had 5-6 episodes of diarrhea per day but no abdominal pain or vomiting.  No melena or hematochezia.  She denies any trouble walking or talking.  She reports that she is been compliant with her blood pressure medication and took her new dose this morning.  She was recently prescribed Nurtec however has only had 2 doses of this without much relief.  She has not tried any other medications other than what is been given to her in the  emergency department.  She denies any allergies to medications.  Denies any tobacco, EtOH illicit drug use ever.     Headache Associated symptoms: diarrhea, nausea and photophobia   Associated symptoms: no abdominal pain, no congestion, no dizziness, no fever, no neck pain, no neck stiffness, no vomiting and no weakness   Diarrhea Associated symptoms: chills and headaches   Associated symptoms: no abdominal pain, no fever and no vomiting   Migraine Associated symptoms include headaches. Pertinent negatives include no chest pain, no abdominal pain and no shortness of breath.       Home Medications Prior to Admission medications   Medication Sig Start Date End Date Taking? Authorizing Provider  Blood Pressure Monitoring (BLOOD PRESSURE KIT) DEVI 1 kit by Does not apply route once a week. Patient not taking: Reported on 06/06/2022 01/24/21   Constant, Peggy, MD  Butalbital-APAP-Caffeine 50-325-40 MG capsule Take 1-2 capsules by mouth every 6 (six) hours as needed for headache. Patient not taking: Reported on 07/28/2022 04/29/21   Glyn Ade, Scot Jun, PA-C  cyclobenzaprine (FLEXERIL) 10 MG tablet Take 1 tablet (10 mg total) by mouth 3 (three) times daily as needed for muscle spasms (headache). 11/17/22   Teague Chestine Spore, Scot Jun, PA-C  Galcanezumab-gnlm (EMGALITY) 120 MG/ML SOAJ Inject 240 mg into the skin as directed AND 120 mg every 30 (thirty) days. Inj 240mg  once then 120mg  monthly. 12/01/22   Glyn Ade,  Scot Jun, PA-C  metoprolol tartrate (LOPRESSOR) 25 MG tablet Take 1 tablet (25 mg total) by mouth 2 (two) times daily. 11/17/22   Bertram Denver, PA-C  Prenatal Vit-Fe Fumarate-FA (PRENATAL MULTIVITAMIN) TABS tablet Take 1 tablet by mouth daily.    [provider]  Rimegepant Sulfate (NURTEC) 75 MG TBDP Take 1 tablet (75 mg total) by mouth as needed (migraine). 12/01/22   Teague Chestine Spore, Scot Jun, PA-C  topiramate (TOPAMAX) 25 MG tablet Take 3 tablets (75 mg total) by mouth  daily. Patient taking differently: Take 25 mg by mouth in the morning, at noon, and at bedtime. 11/11/21   Teague Chestine Spore, Scot Jun, PA-C  valsartan-hydrochlorothiazide (DIOVAN HCT) 320-25 MG tablet Take 1 tablet by mouth daily. 12/12/22   Shelby Mattocks, DO      Allergies    Bee venom    Review of Systems   Review of Systems  Constitutional:  Positive for chills. Negative for fever.  HENT:  Negative for congestion and rhinorrhea.   Eyes:  Positive for photophobia and visual disturbance.  Respiratory:  Negative for shortness of breath.   Cardiovascular:  Negative for chest pain.  Gastrointestinal:  Positive for diarrhea and nausea. Negative for abdominal pain, blood in stool and vomiting.  Musculoskeletal:  Negative for gait problem, neck pain and neck stiffness.  Neurological:  Positive for headaches. Negative for dizziness, syncope, speech difficulty, weakness and light-headedness.    Physical Exam Updated Vital Signs BP (!) 175/127 (BP Location: Right Arm)   Pulse 71   Temp 98 F (36.7 C) (Oral)   Resp 17   LMP 12/04/2022 (Approximate)   SpO2 97%  Physical Exam Vitals and nursing note reviewed.  Constitutional:      General: She is not in acute distress.    Appearance: She is not toxic-appearing.  HENT:     Head: Normocephalic and atraumatic.      Comments: No tenderness to the temple.  No overlying skin changes noted.  Patient does have some tenderness to the occiput area.  As noted above. Eyes:     General: No scleral icterus.    Extraocular Movements: Extraocular movements intact.     Pupils: Pupils are equal, round, and reactive to light.  Pulmonary:     Effort: Pulmonary effort is normal. No respiratory distress.  Musculoskeletal:     Cervical back: Normal range of motion. No rigidity.  Skin:    General: Skin is warm and dry.  Neurological:     Mental Status: She is alert.     GCS: GCS eye subscore is 4. GCS verbal subscore is 5. GCS motor subscore is 6.      Cranial Nerves: No cranial nerve deficit, dysarthria or facial asymmetry.     Sensory: No sensory deficit.     Motor: No weakness or pronator drift.     Coordination: Finger-Nose-Finger Test normal.     ED Results / Procedures / Treatments   Labs (all labs ordered are listed, but only abnormal results are displayed) Labs Reviewed  CBC WITH DIFFERENTIAL/PLATELET  COMPREHENSIVE METABOLIC PANEL  MAGNESIUM  URINALYSIS, ROUTINE W REFLEX MICROSCOPIC  HCG, SERUM, QUALITATIVE    EKG None  Radiology DG FL GUIDED LUMBAR PUNCTURE  Result Date: 12/13/2022 CLINICAL DATA:  38 year old female with recurrent migraine headaches and diplopia, concerning for pseudotumor. EXAM: DIAGNOSTIC LUMBAR PUNCTURE UNDER FLUOROSCOPIC GUIDANCE COMPARISON:  None Available. FLUOROSCOPY: Radiation Exposure Index (as provided by the fluoroscopic device): 1.5 mGy Kerma PROCEDURE: Informed consent was  obtained from the patient prior to the procedure, including potential risks and complications of headache, allergy, bleeding, and, damage to adjacent structures, CSF leak which may need additional procedure, and pain. Time-out was performed confirmed correct patient and correct procedure, allergies were reviewed. With the patient prone, the lower back was prepped with Betadine. 1% Lidocaine was used for local anesthesia. Lumbar puncture was performed at the L2-L3 level using a 3.5 inch 20 gauge needle with return of clear, colorless CSF with an opening pressure of 17 cm water. 8.5 ml of CSF were obtained for laboratory studies. Closing pressure was 13 cm of water. The spinal needle was removed and dressing was placed. The patient tolerated the procedure well and there were no apparent complications. IMPRESSION: Successful fluoroscopic guided guided lumbar puncture. This procedure was performed by Lawernce Ion, PA-C under the supervision of Myles Rosenthal, MD. Electronically Signed   By: Danae Orleans M.D.   On: 12/13/2022 16:34   MR  BRAIN W WO CONTRAST  Result Date: 12/13/2022 CLINICAL DATA:  Headache, intracranial hypertension features EXAM: MRI HEAD WITHOUT AND WITH CONTRAST MR VENOGRAM HEAD WITHOUT AND WITH CONTRAST TECHNIQUE: Multiplanar, multi-echo pulse sequences of the brain and surrounding structures were acquired without and with intravenous contrast. Angiographic images of the intracranial venous structures were acquired using MRV technique without and with intravenous contrast. CONTRAST:  8mL GADAVIST GADOBUTROL 1 MMOL/ML IV SOLN COMPARISON:  03/25/2021 MRI head and MRV correlation is also made with 12/05/2022 CT head multiple FINDINGS: MRI HEAD WITHOUT AND WITH CONTRAST Brain: No restricted diffusion to suggest acute or subacute infarct. No abnormal parenchymal or meningeal enhancement. No acute hemorrhage, mass, mass effect, or midline shift. No hydrocephalus or extra-axial collection. Pituitary and craniocervical junction within normal limits. No hemosiderin deposition to suggest remote hemorrhage. Normal cerebral volume for age. Dilated perivascular space or choroid fissure cyst on the right, unchanged. Vascular: Normal arterial flow voids. Normal arterial and venous enhancement. Skull and upper cervical spine: Normal marrow signal. Sinuses/Orbits: Clear paranasal sinuses. No acute finding in the orbits. Other: The mastoid air cells are well aerated. The petrous apices are pneumatized bilaterally, with fluid in the left petrous apex, which is new from the prior MRI. The septa appear intact. No abnormal enhancement to suggest petrous apicitis. MR VENOGRAM HEAD WITHOUT AND WITH CONTRAST There is no evidence of dural venous sinus or deep cerebral vein thrombosis. No dural venous sinus stenosis. In particular, there is no significant stenosis in the transverse sinuses, near the transverse-sigmoid junction. IMPRESSION: 1. No acute intracranial process. 2. No evidence of dural venous sinus thrombosis or stenosis. 3. No findings  suggestive of idiopathic intracranial hypertension. Electronically Signed   By: Wiliam Ke M.D.   On: 12/13/2022 13:20   MR MRV HEAD W WO CONTRAST  Result Date: 12/13/2022 CLINICAL DATA:  Headache, intracranial hypertension features EXAM: MRI HEAD WITHOUT AND WITH CONTRAST MR VENOGRAM HEAD WITHOUT AND WITH CONTRAST TECHNIQUE: Multiplanar, multi-echo pulse sequences of the brain and surrounding structures were acquired without and with intravenous contrast. Angiographic images of the intracranial venous structures were acquired using MRV technique without and with intravenous contrast. CONTRAST:  8mL GADAVIST GADOBUTROL 1 MMOL/ML IV SOLN COMPARISON:  03/25/2021 MRI head and MRV correlation is also made with 12/05/2022 CT head multiple FINDINGS: MRI HEAD WITHOUT AND WITH CONTRAST Brain: No restricted diffusion to suggest acute or subacute infarct. No abnormal parenchymal or meningeal enhancement. No acute hemorrhage, mass, mass effect, or midline shift. No hydrocephalus or extra-axial collection. Pituitary  and craniocervical junction within normal limits. No hemosiderin deposition to suggest remote hemorrhage. Normal cerebral volume for age. Dilated perivascular space or choroid fissure cyst on the right, unchanged. Vascular: Normal arterial flow voids. Normal arterial and venous enhancement. Skull and upper cervical spine: Normal marrow signal. Sinuses/Orbits: Clear paranasal sinuses. No acute finding in the orbits. Other: The mastoid air cells are well aerated. The petrous apices are pneumatized bilaterally, with fluid in the left petrous apex, which is new from the prior MRI. The septa appear intact. No abnormal enhancement to suggest petrous apicitis. MR VENOGRAM HEAD WITHOUT AND WITH CONTRAST There is no evidence of dural venous sinus or deep cerebral vein thrombosis. No dural venous sinus stenosis. In particular, there is no significant stenosis in the transverse sinuses, near the transverse-sigmoid  junction. IMPRESSION: 1. No acute intracranial process. 2. No evidence of dural venous sinus thrombosis or stenosis. 3. No findings suggestive of idiopathic intracranial hypertension. Electronically Signed   By: Wiliam Ke M.D.   On: 12/13/2022 13:20    Procedures .Lumbar Puncture  Date/Time: 12/13/2022 1:30 PM  Performed by: Achille Rich, PA-C Authorized by: Achille Rich, PA-C   Consent:    Consent obtained:  Verbal   Consent given by:  Patient   Risks, benefits, and alternatives were discussed: yes     Risks discussed:  Bleeding, infection, headache, repeat procedure, pain and nerve damage   Alternatives discussed:  No treatment Universal protocol:    Procedure explained and questions answered to patient or proxy's satisfaction: yes     Patient identity confirmed:  Verbally with patient Pre-procedure details:    Procedure purpose:  Diagnostic (Diagnostic and potenially therapeutic)   Preparation: Patient was prepped and draped in usual sterile fashion   Sedation:    Sedation type:  None Anesthesia:    Anesthesia method:  Local infiltration   Local anesthetic:  Lidocaine 1% w/o epi Procedure details:    Lumbar space:  L3-L4 interspace   Patient position:  L lateral decubitus   Needle gauge:  20   Needle type:  Spinal needle - Quincke tip   Needle length (in):  3.5   Ultrasound guidance: no     Number of attempts:  3 Post-procedure details:    Procedure completion:  Procedure terminated electively by provider Comments:     Risks and benefits of the procedure were discussed. Patient agreed to the procedure. Landmarks were palpated and area marked. Sterile gloves and gown donned. The are was cleansed with betadine x3 in concentric circles.  Sterile drape was attached.  Lidocaine wheal was placed as well as local wound infiltration into the area.  A bevel was placed facing up as the patient was in the left lateral decubitus position.  A spinal needle was advanced however was  hitting vertebrae x 2.  I was able to advance the needle and the third time however met resistance.  Stylette was removed and unfortunately was not able to release any CSF.  My attending attempted as well without success.  Area was cleansed and Band-Aid was placed over the lumbar puncture mark. Will continue with IR for LP.      Medications Ordered in ED Medications  valproate (DEPACON) 1,000 mg in dextrose 5 % 50 mL IVPB (1,000 mg Intravenous New Bag/Given 12/13/22 1612)  gadobutrol (GADAVIST) 1 MMOL/ML injection 8 mL (8 mLs Intravenous Contrast Given 12/13/22 1138)  lidocaine (PF) (XYLOCAINE) 1 % injection 5 mL (3 mLs Intradermal Given 12/13/22 1558)    ED  Course/ Medical Decision Making/ A&P Clinical Course as of 12/13/22 1528  Wed Dec 13, 2022  1425 Awaiting call back from IR.  [RR]  1435 IR, Aimee Saint Martin PA, reports they will be getting her shortly for LP.  [RR]    Clinical Course User Index [RR] Achille Rich, PA-C    Medical Decision Making Amount and/or Complexity of Data Reviewed Labs: ordered. Radiology: ordered.  Risk Prescription drug management.   37 y.o. female presents to the ER for evaluation of headache. Differential diagnosis includes but is not limited to Stroke, increased ICP, meningitis, CVA, intracranial tumor, venous sinus thrombosis, migraine, cluster headache, hypertension, drug related, head injury, tension headache, sinusitis, dental abscess, otitis media, TMJ, temporal arteritis, glaucoma, trigeminal neuralgia. Vital signs elevated blood pressure otherwise unremarkable. Physical exam as noted above.   On previous chart evaluation, the patient has been seen twice in the ER for her headaches, with today being the third ER visit in 8 days. Additionally, she was seen at an UC and her PCP. She has received multiple migraine cocktails and prednisone without any relief of her symptoms.  Given patient's repeated emergency room visits, I consulted neurology.  The  patient does have a neurologist however is at [facility] and I am unable to view the notes.  I spoke with Dr. Iver Nestle about the patient. She would like for me to order an MRI with and without contrast in addition to an MRV.  Also recommends doing a lumbar puncture for opening pressures.  Would like to rule out any press syndrome given the patient's elevated blood pressures and also any pseudotumor cerebri for any increasing cranial pressure.  Does not recommend any medication given now until after imaging results.  Discussed with her about possible 1 g of valproic acid which she thinks would be reasonable since the migraine cocktails were not relieving symptoms.   I independently reviewed and interpreted the patient's labs.  Urinalysis shows straw-colored urine with small and hemoglobin.  Rare bacteria present otherwise unremarkable.  Magnesium within normal limits.  CBC without leukocytosis or anemia.  CMP shows potassium 3.4 and calcium 8.7 otherwise no electrolyte or LFT abnormality.  hCG is negative.Marland Kitchen  MRI imaging  1. No acute intracranial process. 2. No evidence of dural venous sinus thrombosis or stenosis. 3. No findings suggestive of idiopathic intracranial hypertension. Per radiologist's read.   My attending assessed at bedside and assisted with LP.  Please see procedure note.  2:18 PM LP unsuccessful, Will alert IR for guided LP.   I spoke with Aimee Han, PA-C, with IR who will fit the patient in for LP today.   Will hand off to oncoming shift , [facility], to follow up with IR after LP and CSF fluids. Likely discharge home.   Portions of this report may have been transcribed using voice recognition software. Every effort was made to ensure accuracy; however, inadvertent computerized transcription errors may be present.   I discussed this case with my attending physician who cosigned this note including patient's presenting symptoms, physical exam, and planned diagnostics and  interventions. Attending physician stated agreement with plan or made changes to plan which were implemented.   Final Clinical Impression(s) / ED Diagnoses Final diagnoses:  None    Rx / DC Orders ED Discharge Orders     None         Achille Rich, PA-C 12/13/22 1832    Achille Rich, PA-C 12/13/22 1840    Virgina Norfolk, DO 12/14/22 1332

## 2022-12-15 ENCOUNTER — Encounter: Payer: Self-pay | Admitting: Physician Assistant

## 2022-12-15 ENCOUNTER — Encounter: Payer: Medicaid Other | Admitting: Physician Assistant

## 2022-12-15 ENCOUNTER — Ambulatory Visit: Payer: Medicaid Other | Admitting: Physician Assistant

## 2022-12-15 ENCOUNTER — Encounter: Payer: Self-pay | Admitting: Student

## 2022-12-15 ENCOUNTER — Telehealth: Payer: Self-pay

## 2022-12-15 VITALS — BP 160/110 | Wt 190.4 lb

## 2022-12-15 DIAGNOSIS — G44209 Tension-type headache, unspecified, not intractable: Secondary | ICD-10-CM | POA: Diagnosis not present

## 2022-12-15 DIAGNOSIS — G43901 Migraine, unspecified, not intractable, with status migrainosus: Secondary | ICD-10-CM

## 2022-12-15 DIAGNOSIS — M5481 Occipital neuralgia: Secondary | ICD-10-CM

## 2022-12-15 DIAGNOSIS — G43011 Migraine without aura, intractable, with status migrainosus: Secondary | ICD-10-CM

## 2022-12-15 MED ORDER — TOPIRAMATE 25 MG PO TABS: 75.0000 mg | ORAL_TABLET | Freq: Every day | ORAL | 3 refills | Status: AC

## 2022-12-15 NOTE — Progress Notes (Signed)
ED follow up   History:  Michele Gardner is a 37 y.o. [id number] who presents to clinic today for eval of 10 day headache.  She knows exactly when it started when she was sitting in her boss' office and she felt pain on the right side that went up and over her head settling on the left.  She has had left sided head and neck pain ever since. This is not her usual headache. It is completely debilitating.  She has had oral, IM and IV medications including Reglan, Toradol, prednisone, benadryl, Nurtec, Flexeril and potentially others.  She reports none of it has brought relief from the pain, although some helped her sleep.  She does admit to a significant left of stress at baseline.  She requests nerve blocks/trigger point injections as she states that has been helpful in the past.   She is also interested in retrying topamax as the discontinuation of that medication may have triggered the headache (discontinued due to cognitive issues).  The plan had been for her to use Emgality for migraine prevention but she has not yet started this. She was given oral prednisone rx 5 days ago but did not use this. Blood pressure was not addressed with ED. MRI, MRA, LP performed and no significant findings.    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  On enalapril 20 mg daily and Maxide 37.5-25 mg daily        Hypertension    Migraine     Social History   Socioeconomic History   Marital status: Single    Spouse name: Not on file   Number of children: Not on file   Years of education: Not on file   Highest education level: Not on file  Occupational History   Not on file  Tobacco Use   Smoking status: Former    Current packs/day: 0.00    Types: Cigarettes    Quit date:  04/23/2015    Years since quitting: 7.6    Passive exposure: Past   Smokeless tobacco: Never  Vaping Use   Vaping status: Never Used  Substance and Sexual Activity   Alcohol use: No   Drug use: No   Sexual activity: Yes    Partners: Male    Birth control/protection: None  Other Topics Concern   Not on file  Social History Narrative   Not on file   Social Determinants of Health   Financial Resource Strain: Not on file  Food Insecurity: No Food Insecurity (05/25/2022)   Hunger Vital Sign    Worried About Running Out of Food in the Last Year: Never true    Ran Out of Food in the Last Year: Never true  Transportation Needs: No Transportation Needs (05/25/2022)   PRAPARE - Administrator, Civil Service (Medical): No    Lack of Transportation (Non-Medical): No  Physical Activity: Not on file  Stress: Not on file  Social Connections: Not on file  Intimate Partner Violence: Not At Risk (05/25/2022)   Humiliation, Afraid, Rape, and Kick questionnaire    Fear of Current or Ex-Partner: No    Emotionally Abused: No    Physically Abused: No    Sexually Abused: No  Family History  Problem Relation Age of Onset   Hypertension Mother    Hyperlipidemia Mother    Varicose Veins Father    Melanoma Father    Alcohol abuse Father    Cancer Maternal Grandfather    Diabetes Paternal Grandmother    Cancer Paternal Grandmother    Diabetes Paternal Grandfather    Brain cancer Other     Allergies  Allergen Reactions   Bee Venom Anaphylaxis    Current Outpatient Medications on File Prior to Visit  Medication Sig Dispense Refill   Butalbital-APAP-Caffeine 50-325-40 MG capsule Take 1-2 capsules by mouth every 6 (six) hours as needed for headache. 40 capsule 2   cyclobenzaprine (FLEXERIL) 10 MG tablet Take 1 tablet (10 mg total) by mouth 3 (three) times daily as needed for muscle spasms (headache). 30 tablet 2   Galcanezumab-gnlm (EMGALITY) 120 MG/ML SOAJ Inject 240 mg into  the skin as directed AND 120 mg every 30 (thirty) days. Inj 240mg  once then 120mg  monthly. 1 mL 11   metoprolol tartrate (LOPRESSOR) 25 MG tablet Take 1 tablet (25 mg total) by mouth 2 (two) times daily. 60 tablet 3   Prenatal Vit-Fe Fumarate-FA (PRENATAL MULTIVITAMIN) TABS tablet Take 1 tablet by mouth daily.     Rimegepant Sulfate (NURTEC) 75 MG TBDP Take 1 tablet (75 mg total) by mouth as needed (migraine). 8 tablet 11   topiramate (TOPAMAX) 25 MG tablet Take 3 tablets (75 mg total) by mouth daily. (Patient taking differently: Take 25 mg by mouth in the morning, at noon, and at bedtime.) 90 tablet 3   valsartan-hydrochlorothiazide (DIOVAN HCT) 320-25 MG tablet Take 1 tablet by mouth daily. 30 tablet 0   Blood Pressure Monitoring (BLOOD PRESSURE KIT) DEVI 1 kit by Does not apply route once a week. (Patient not taking: Reported on 06/06/2022) 1 each 0   No current facility-administered medications on file prior to visit.     Review of Systems:  All pertinent positive/negative included in HPI, all other review of systems are negative   Objective:  Physical Exam BP (!) 160/110   Wt 190 lb 6.4 oz (86.4 kg)   LMP 12/04/2022 (Approximate)   BMI 30.73 kg/m  CONSTITUTIONAL: Well-developed, well-nourished female in no acute distress.  EYES: EOM intact HEAD: Exquisitely tender over left greater and lesser occipital foramens ENT: Normocephalic CARDIOVASCULAR: Regular rate  RESPIRATORY: Normal rate.  MUSCULOSKELETAL: Normal ROM, strength equal bilaterally, significant muscle spasm of neck and shoulders noted bilaterally SKIN: Warm, dry without erythema  NEUROLOGICAL: Alert, oriented, CN II-XII grossly intact, Appropriate balance   PSYCH: Normal behavior, mood  PROCEDURE:  TRIGGER POINT INJECTION/Occipital Nerve Block  Procedure: Mixture of 1%  Lidocaine, marcaine and dexamethazone in a ratio of 2:2:1  injected with 1cc each site in bilateral greater Occipital Nerves, bilateral lesser  occipital nerves, bilateral cervical paraspinal muscles, bilateral trapezius.  Total amount injected: 14cc.  Pt tolerated procedure well.   Assessment & Plan:  Assessment: 1. Intractable migraine without aura and with status migrainosus   2. Status migrainosus   3. Tension headache   4. Occipital neuralgia of left side      Plan: Injections today in hopes of finally breaking this status.   Refer to Neurology for refractory headache Thorazine as PRN rescue - 25mg  1 po prn.  Take and go to bed for 8 hours.  Expect sedation/grogginess even after waking.  No operation of heavy vehicles.   Baclofen in place of Flexeril Emgality sample provided.  Advise:  use today. Can restart topiramate if pt decides that is needed.    Follow-up in 3 months or sooner PRN  36 minutes of face to face care provided to patient during this encounter, apart from the procedure itself.   Bertram Denver, PA-C 12/15/2022 9:40 AM

## 2022-12-15 NOTE — Telephone Encounter (Signed)
Pt called in stating her employer is threatening to terminate her due to absences for illness.

## 2022-12-16 LAB — CSF CULTURE W GRAM STAIN: Culture: NO GROWTH

## 2022-12-18 ENCOUNTER — Telehealth (HOSPITAL_COMMUNITY): Payer: Self-pay | Admitting: Student

## 2022-12-18 NOTE — Progress Notes (Signed)
    SUBJECTIVE:   CHIEF COMPLAINT / HPI:   BT is a 37 yo F w/ hx of HTN and migraines that p/f HTN f/u.   HTN - Tolerating meds well and taking consistently  Migaine - still having migraine.  - Was able to see Nada Maclachlan (OBGYN) this past Friday. Was given emgality - Used to be on topamax, but stopped it due to groggy feelings.  OBJECTIVE:   LMP 12/04/2022 (Approximate)   General: Alert, pleasant woman. NAD. HEENT: NCAT. MMM. CV: RRR, no murmurs.  Resp: CTAB, no wheezing or crackles. Normal WOB on RA.  Abm: Soft, nontender, nondistended. BS present. Ext: Moves all ext spontaneously Skin: Warm, well perfused   ASSESSMENT/PLAN:   No problem-specific Assessment & Plan notes found for this encounter.   Assessment & Plan Benign essential hypertension Much improved BP today. Given pt young age, could tolerate a lower BP goal of <120/80. - Cont Valsartan-hydrochlorothiazide - Increase metoprolol to 50mg  BID for added BP and migraine benefit. If tolerating, can consolidate to daily dosing. - BMP today Other migraine with status migrainosus, intractable Chronic, uncontrolled. Has Neurology referral. - Will increase metop as above    Lincoln Brigham, MD [facility]

## 2022-12-18 NOTE — Telephone Encounter (Signed)
Patient with a history of migraines who underwent image-guided lumbar puncture five days ago on 12/13/22. Patient contacted IR today with complaints of a persistent headache going on 15 days. Patient stated the severity/intensity of the headache is similar to how it felt pre-LP but perhaps just a little worse. The pain does not change with body position, does not feel better or worse when she lays down.   Patient encouraged to get in touch with her Neurologist for possible treatment options. We discussed that her headache could be a persistent migraine or possibly a post-LP CSF leak.   Patient verbalized understanding.  Alwyn Ren, [place] [phone] 12/18/2022, 2:14 PM

## 2022-12-19 ENCOUNTER — Encounter: Payer: Self-pay | Admitting: Family Medicine

## 2022-12-19 ENCOUNTER — Ambulatory Visit (INDEPENDENT_AMBULATORY_CARE_PROVIDER_SITE_OTHER): Payer: Medicaid Other | Admitting: Family Medicine

## 2022-12-19 VITALS — BP 126/88 | HR 64 | Wt 188.0 lb

## 2022-12-19 DIAGNOSIS — G43811 Other migraine, intractable, with status migrainosus: Secondary | ICD-10-CM | POA: Diagnosis not present

## 2022-12-19 DIAGNOSIS — I10 Essential (primary) hypertension: Secondary | ICD-10-CM

## 2022-12-19 MED ORDER — METOPROLOL SUCCINATE ER 50 MG PO TB24: 50.0000 mg | ORAL_TABLET | Freq: Two times a day (BID) | ORAL | 1 refills | Status: DC

## 2022-12-19 NOTE — Patient Instructions (Signed)
Good to see you today - Thank you for coming in  Things we discussed today:  1) Your blood pressure is much better today! Let's increase your metoprolol to see if it can improve your headaches a little bit. - Increase metoprolol from 25 to 50mg  twice a day - Continue taking you Valsartan-hydrochlorothiazide  - We will check you electrolytes today  2) It looks like your OBGYN sent a referral for you to see Neurology    Come back to see me in 1 month to recheck your blood pressure.

## 2022-12-20 LAB — BASIC METABOLIC PANEL
BUN/Creatinine Ratio: 11 (ref 9–23)
BUN: 10 mg/dL (ref 6–20)
CO2: 21 mmol/L (ref 20–29)
Calcium: 9.8 mg/dL (ref 8.7–10.2)
Chloride: 99 mmol/L (ref 96–106)
Creatinine, Ser: 0.89 mg/dL (ref 0.57–1.00)
Glucose: 94 mg/dL (ref 70–99)
Potassium: 4.4 mmol/L (ref 3.5–5.2)
Sodium: 139 mmol/L (ref 134–144)
eGFR: 87 mL/min/{1.73_m2} (ref >59)

## 2022-12-21 NOTE — Progress Notes (Signed)
Chief Complaint  Patient presents with   Room 2    Pt is here with her Daughter. Pt states that she still has the pain of her migraines but it is manageable. Pt states that she will have a headache that squeezes and burns. Pt states that she gets a headaches everyday.    HISTORY OF PRESENT ILLNESS:  12/27/22 ALL:  Michele Gardner is a 37 y.o. female here today for follow up for migraines. She was seen in consult with Dr Delena Bali 03/2021. She was pregnant at the time and started on cyproheptadine at bedtime. MRI brian and MRV head unremarkable. Referral to PT placed and occipital nerve block administered 2/16. Follow up advised in 3 months.   Since, she reports worsening headaches. She describes a left sided occipital pain. Sometimes sharp stabbing, sometimes throbbing. She occasionally has unilateral numbness and feels she can't see as well. Usually worse at night and in the mornings. BP has been elevated. She was seen by PCP 12/19/2022 for HTN. Metoprolol was increased to 50mg  BID for added BP management and migraine control. She has not taken her HTN meds today. She does wake with dry mouth. She is not sure if she snores. She denies family history of sleep apnea. She tries to stay hydrated. She has not had an eye exam.   She was recently seen by the ER 10/22, 10/28 and 10/30 for intractable left sided occipital headache for 9 days. CT head, MRI brain and MRV normal. LP normal. Divalproex not effective. She was seen by OB-GYN 12/15/2022 who started topiramate. She reported feeling groggy on topiramate 75mg  and was advised to reduce dose to 25-50mg  daily. Emgality added. She took started dose but has not filled maintenance dose yet. She has not taken topiramate.   HISTORY (copied from Dr Quentin Mulling previous note)  The patient presents for evaluation of daily migraines which have been present since she was 37 years old, but have worsened in severity since becoming pregnant. Migraines occur daily and last  for several hours at a time. They are described as pounding pain in her vertex with associated photophobia, phonophobia, and nausea.   Takes Tylenol as needed but this doesn't help much. Flexeril helps but makes her drowsy.   Headache History: Onset: 15 years ago Triggers: none Aura: no Location: vertex Quality/Description: pounding Associated Symptoms:             Photophobia: yes             Phonophobia: yes             Nausea: yes Vomiting: yes Worse with activity?: yes Duration of headaches: several hours   Headache days per month: 30 Headache free days per month: 0   Current Treatment: Abortive Flexeril Tylenol   Preventative none   Prior Therapies                                 Labetalol 400 mg BID Flexeril Tylenol Excedrin   Headache Risk Factors: Headache risk factors and/or co-morbidities (+) Neck Pain   REVIEW OF SYSTEMS: Out of a complete 14 system review of symptoms, the patient complains only of the following symptoms, headaches, and all other reviewed systems are negative.   ALLERGIES: Allergies  Allergen Reactions   Bee Venom Anaphylaxis     HOME MEDICATIONS: Outpatient Medications Prior to Visit  Medication Sig Dispense Refill   Blood Pressure Monitoring (  BLOOD PRESSURE KIT) DEVI 1 kit by Does not apply route once a week. 1 each 0   Butalbital-APAP-Caffeine 50-325-40 MG capsule Take 1-2 capsules by mouth every 6 (six) hours as needed for headache. 40 capsule 2   cyclobenzaprine (FLEXERIL) 10 MG tablet Take 1 tablet (10 mg total) by mouth 3 (three) times daily as needed for muscle spasms (headache). 30 tablet 2   metoprolol succinate (TOPROL XL) 50 MG 24 hr tablet Take 1 tablet (50 mg total) by mouth in the morning and at bedtime. Take with or immediately following a meal. 60 tablet 1   valsartan-hydrochlorothiazide (DIOVAN HCT) 320-25 MG tablet Take 1 tablet by mouth daily. 30 tablet 0   Galcanezumab-gnlm (EMGALITY) 120 MG/ML SOAJ Inject 240  mg into the skin as directed AND 120 mg every 30 (thirty) days. Inj 240mg  once then 120mg  monthly. (Patient not taking: Reported on 12/27/2022) 1 mL 11   Prenatal Vit-Fe Fumarate-FA (PRENATAL MULTIVITAMIN) TABS tablet Take 1 tablet by mouth daily. (Patient not taking: Reported on 12/27/2022)     Rimegepant Sulfate (NURTEC) 75 MG TBDP Take 1 tablet (75 mg total) by mouth as needed (migraine). (Patient not taking: Reported on 12/27/2022) 8 tablet 11   topiramate (TOPAMAX) 25 MG tablet Take 3 tablets (75 mg total) by mouth daily. (Patient not taking: Reported on 12/27/2022) 90 tablet 3   No facility-administered medications prior to visit.     PAST MEDICAL HISTORY: Past Medical History:  Diagnosis Date   Asthma    Chronic hypertension affecting pregnancy 12/29/2020   Blood pressure is resistant to medications.  On enalapril 20 mg daily and Maxide 37.5-25 mg daily        Hypertension    Migraine      PAST SURGICAL HISTORY: Past Surgical History:  Procedure Laterality Date   WISDOM TOOTH EXTRACTION  2007     FAMILY HISTORY: Family History  Problem Relation Age of Onset   Hypertension Mother    Hyperlipidemia Mother    Varicose Veins Father    Melanoma Father    Alcohol abuse Father    Cancer Maternal Grandfather    Diabetes Paternal Grandmother    Cancer Paternal Grandmother    Diabetes Paternal Grandfather    Brain cancer Other      SOCIAL HISTORY: Social History   Socioeconomic History   Marital status: Single    Spouse name: Not on file   Number of children: Not on file   Years of education: Not on file   Highest education level: Not on file  Occupational History   Not on file  Tobacco Use   Smoking status: Former    Current packs/day: 0.00    Types: Cigarettes    Quit date: 04/23/2015    Years since quitting: 7.6    Passive exposure: Past   Smokeless tobacco: Never  Vaping Use   Vaping status: Never Used  Substance and Sexual Activity   Alcohol use: No    Drug use: No   Sexual activity: Yes    Partners: Male    Birth control/protection: None  Other Topics Concern   Not on file  Social History Narrative   Not on file   Social Determinants of Health   Financial Resource Strain: Not on file  Food Insecurity: No Food Insecurity (05/25/2022)   Hunger Vital Sign    Worried About Running Out of Food in the Last Year: Never true    Ran Out of Food in the Last Year:  Never true  Transportation Needs: No Transportation Needs (05/25/2022)   PRAPARE - Administrator, Civil Service (Medical): No    Lack of Transportation (Non-Medical): No  Physical Activity: Not on file  Stress: Not on file  Social Connections: Not on file  Intimate Partner Violence: Not At Risk (05/25/2022)   Humiliation, Afraid, Rape, and Kick questionnaire    Fear of Current or Ex-Partner: No    Emotionally Abused: No    Physically Abused: No    Sexually Abused: No     PHYSICAL EXAM  Vitals:   12/27/22 0824  BP: (!) 143/103  Pulse: 72  Weight: 191 lb 8 oz (86.9 kg)  Height: 5\' 6"  (1.676 m)   Body mass index is 30.91 kg/m.  Generalized: Well developed, in no acute distress  Cardiology: normal rate and rhythm, no murmur auscultated  Respiratory: clear to auscultation bilaterally    Neurological examination  Mentation: Alert oriented to time, place, history taking. Follows all commands speech and language fluent Cranial nerve II-XII: Pupils were equal round reactive to light. Extraocular movements were full, visual field were full on confrontational test. Facial sensation and strength were normal. Uvula tongue midline. Head turning and shoulder shrug  were normal and symmetric. Motor: The motor testing reveals 5 over 5 strength of all 4 extremities. Good symmetric motor tone is noted throughout.  Sensory: Sensory testing is intact to soft touch on all 4 extremities. No evidence of extinction is noted.  Coordination: Cerebellar testing reveals good  finger-nose-finger and heel-to-shin bilaterally.  Gait and station: Gait is normal. Tandem gait is normal. Romberg is negative. No drift is seen.  Reflexes: Deep tendon reflexes are symmetric and normal bilaterally.    DIAGNOSTIC DATA (LABS, IMAGING, TESTING) - I reviewed patient records, labs, notes, testing and imaging myself where available.  Lab Results  Component Value Date   WBC 9.7 12/13/2022   HGB 14.3 12/13/2022   HCT 42.2 12/13/2022   MCV 85.8 12/13/2022   PLT 300 12/13/2022      Component Value Date/Time   NA 139 12/19/2022 1031   K 4.4 12/19/2022 1031   CL 99 12/19/2022 1031   CO2 21 12/19/2022 1031   GLUCOSE 94 12/19/2022 1031   GLUCOSE 96 12/13/2022 0922   BUN 10 12/19/2022 1031   CREATININE 0.89 12/19/2022 1031   CREATININE 0.48 (L) 05/17/2015 0939   CALCIUM 9.8 12/19/2022 1031   PROT 7.0 12/13/2022 0922   PROT 7.2 11/28/2022 1225   ALBUMIN 3.8 12/13/2022 0922   ALBUMIN 4.4 11/28/2022 1225   AST 20 12/13/2022 0922   ALT 21 12/13/2022 0922   ALKPHOS 81 12/13/2022 0922   BILITOT 0.5 12/13/2022 0922   BILITOT 0.4 11/28/2022 1225   GFRNONAA >60 12/13/2022 0922   GFRAA >60 11/30/2015 1015   No results found for: "CHOL", "HDL", "LDLCALC", "LDLDIRECT", "TRIG", "CHOLHDL" Lab Results  Component Value Date   HGBA1C 5.4 02/17/2021   No results found for: "VITAMINB12" Lab Results  Component Value Date   TSH 0.373 (L) 11/28/2022        No data to display               No data to display           ASSESSMENT AND PLAN  37 y.o. year old female  has a past medical history of Asthma, Chronic hypertension affecting pregnancy (12/29/2020), Hypertension, and Migraine. here with    Chronic migraine without aura with status migrainosus, not  intractable  TAYLAN GINGERICH reports worsening headaches with hemiplegic symptoms over the past few months. OB-GYN has started her on topiramate 25-50mg  daily and Emgality monthly. I have encouraged her to continue  with this plan. May consider sleep evaluation for possible sleep apnea testing due to morning headaches and non restorative sleep. Healthy lifestyle habits encouraged. She will follow up with PCP as directed. She will keep a close eye on BP. She was encouraged to update eye exam. She will return to see me in 6-8 months, sooner if needed. She verbalizes understanding and agreement with this plan.    No orders of the defined types were placed in this encounter.    No orders of the defined types were placed in this encounter.   I spent 30 minutes of face-to-face and non-face-to-face time with patient.  This included previsit chart review, lab review, study review, order entry, electronic health record documentation, patient education.   Shawnie Dapper, MSN, FNP-C 12/27/2022, 9:14 AM  [facility] [address], [address] [place], [place] [address] [phone]

## 2022-12-21 NOTE — Assessment & Plan Note (Signed)
Much improved BP today. Given pt young age, could tolerate a lower BP goal of <120/80. - Cont Valsartan-hydrochlorothiazide - Increase metoprolol to 50mg  BID for added BP and migraine benefit. If tolerating, can consolidate to daily dosing. - BMP today

## 2022-12-23 ENCOUNTER — Encounter: Payer: Self-pay | Admitting: Family Medicine

## 2022-12-26 ENCOUNTER — Ambulatory Visit: Payer: Medicaid Other | Admitting: Cardiology

## 2022-12-27 ENCOUNTER — Ambulatory Visit: Payer: Medicaid Other | Admitting: Family Medicine

## 2022-12-27 ENCOUNTER — Encounter: Payer: Self-pay | Admitting: Family Medicine

## 2022-12-27 VITALS — BP 143/103 | HR 72 | Ht 66.0 in | Wt 191.5 lb

## 2022-12-27 DIAGNOSIS — G43701 Chronic migraine without aura, not intractable, with status migrainosus: Secondary | ICD-10-CM

## 2022-12-27 NOTE — Patient Instructions (Signed)
Below is our plan:  We will continue to monitor. I recommend continuing plan started by Silvio Clayman. Start topiramate asap and continue Emgality every 30 days. Consider sleep evaluation after 6-8 on therapy if headaches are not improving.   Please make sure you are staying well hydrated. I recommend 50-60 ounces daily. Well balanced diet and regular exercise encouraged. Consistent sleep schedule with 6-8 hours recommended.   Please continue follow up with care team as directed.   Follow up with me in 6-8 months   You may receive a survey regarding today's visit. I encourage you to leave honest feed back as I do use this information to improve patient care. Thank you for seeing me today!   GENERAL HEADACHE INFORMATION:   Natural supplements: Magnesium Oxide or Magnesium Glycinate 500 mg at bed (up to 800 mg daily) Coenzyme Q10 300 mg in AM Vitamin B2- 200 mg twice a day   Add 1 supplement at a time since even natural supplements can have undesirable side effects. You can sometimes buy supplements cheaper (especially Coenzyme Q10) at www.WebmailGuide.co.za or at [facility].  Migraine with aura: There is increased risk for stroke in women with migraine with aura and a contraindication for the combined contraceptive pill for use by women who have migraine with aura. The risk for women with migraine without aura is lower. However other risk factors like smoking are far more likely to increase stroke risk than migraine. There is a recommendation for no smoking and for the use of OCPs without estrogen such as progestogen only pills particularly for women with migraine with aura.Marland Kitchen People who have migraine headaches with auras may be 3 times more likely to have a stroke caused by a blood clot, compared to migraine patients who don't see auras. Women who take hormone-replacement therapy may be 30 percent more likely to suffer a clot-based stroke than women not taking medication containing estrogen. Other risk  factors like smoking and high blood pressure may be  much more important.    Vitamins and herbs that show potential:   Magnesium: Magnesium (250 mg twice a day or 500 mg at bed) has a relaxant effect on smooth muscles such as blood vessels. Individuals suffering from frequent or daily headache usually have low magnesium levels which can be increase with daily supplementation of 400-750 mg. Three trials found 40-90% average headache reduction  when used as a preventative. Magnesium may help with headaches are aura, the best evidence for magnesium is for migraine with aura is its thought to stop the cortical spreading depression we believe is the pathophysiology of migraine aura.Magnesium also demonstrated the benefit in menstrually related migraine.  Magnesium is part of the messenger system in the serotonin cascade and it is a good muscle relaxant.  It is also useful for constipation which can be a side effect of other medications used to treat migraine. Good sources include nuts, whole grains, and tomatoes. Side Effects: loose stool/diarrhea  Riboflavin (vitamin B 2) 200 mg twice a day. This vitamin assists nerve cells in the production of ATP a principal energy storing molecule.  It is necessary for many chemical reactions in the body.  There have been at least 3 clinical trials of riboflavin using 400 mg per day all of which suggested that migraine frequency can be decreased.  All 3 trials showed significant improvement in over half of migraine sufferers.  The supplement is found in bread, cereal, milk, meat, and poultry.  Most Americans get more riboflavin than  the recommended daily allowance, however riboflavin deficiency is not necessary for the supplements to help prevent headache. Side effects: energizing, green urine   Coenzyme Q10: This is present in almost all cells in the body and is critical component for the conversion of energy.  Recent studies have shown that a nutritional supplement of CoQ10  can reduce the frequency of migraine attacks by improving the energy production of cells as with riboflavin.  Doses of 150 mg twice a day have been shown to be effective.   Melatonin: Increasing evidence shows correlation between melatonin secretion and headache conditions.  Melatonin supplementation has decreased headache intensity and duration.  It is widely used as a sleep aid.  Sleep is natures way of dealing with migraine.  A dose of 3 mg is recommended to start for headaches including cluster headache. Higher doses up to 15 mg has been reviewed for use in Cluster headache and have been used. The rationale behind using melatonin for cluster is that many theories regarding the cause of Cluster headache center around the disruption of the normal circadian rhythm in the brain.  This helps restore the normal circadian rhythm.   HEADACHE DIET: Foods and beverages which may trigger migraine Note that only 20% of headache patients are food sensitive. You will know if you are food sensitive if you get a headache consistently 20 minutes to 2 hours after eating a certain food. Only cut out a food if it causes headaches, otherwise you might remove foods you enjoy! What matters most for diet is to eat a well balanced healthy diet full of vegetables and low fat protein, and to not miss meals.   Chocolate, other sweets ALL cheeses except cottage and cream cheese Dairy products, yogurt, sour cream, ice cream Liver Meat extracts (Bovril, Marmite, meat tenderizers) Meats or fish which have undergone aging, fermenting, pickling or smoking. These include: Hotdogs,salami,Lox,sausage, mortadellas,smoked salmon, pepperoni, Pickled herring Pods of broad bean (English beans, Chinese pea pods, [place] (fava) beans, lima and navy beans Ripe avocado, ripe banana Yeast extracts or active yeast preparations such as Brewer's or Fleishman's (commercial bakes goods are permitted) Tomato based foods, pizza (lasagna, etc.)    MSG (monosodium glutamate) is disguised as many things; look for these common aliases: Monopotassium glutamate Autolysed yeast Hydrolysed protein Sodium caseinate "flavorings" "all natural preservatives" Nutrasweet   Avoid all other foods that convincingly provoke headaches.   Resources: The Dizzy [place] Laundry Your Headache Diet, migrainestrong.com  https://zamora-andrews.com/   Caffeine and Migraine For patients that have migraine, caffeine intake more than 3 days per week can lead to dependency and increased migraine frequency. I would recommend cutting back on your caffeine intake as best you can. The recommended amount of caffeine is 200-300 mg daily, although migraine patients may experience dependency at even lower doses. While you may notice an increase in headache temporarily, cutting back will be helpful for headaches in the long run. For more information on caffeine and migraine, visit: https://americanmigrainefoundation.org/resource-library/caffeine-and-migraine/   Headache Prevention Strategies:   1. Maintain a headache diary; learn to identify and avoid triggers.  - This can be a simple note where you log when you had a headache, associated symptoms, and medications used - There are several smartphone apps developed to help track migraines: Migraine Buddy, Migraine Monitor, Curelator N1-Headache App   Common triggers include: Emotional triggers: Emotional/Upset family or friends Emotional/Upset occupation Business reversal/success Anticipation anxiety Crisis-serious Post-crisis periodNew job/position   Physical triggers: Vacation Day Weekend Strenuous Exercise High Altitude Location  New Move Menstrual Day Physical Illness Oversleep/Not enough sleep Weather changes Light: Photophobia or light sesnitivity treatment involves a balance between desensitization and reduction in overly strong input. Use dark polarized  glasses outside, but not inside. Avoid bright or fluorescent light, but do not dim environment to the point that going into a normally lit room hurts. Consider FL-41 tint lenses, which reduce the most irritating wavelengths without blocking too much light.  These can be obtained at axonoptics.com or theraspecs.com Foods: see list above.   2. Limit use of acute treatments (over-the-counter medications, triptans, etc.) to no more than 2 days per week or 10 days per month to prevent medication overuse headache (rebound headache).     3. Follow a regular schedule (including weekends and holidays): Don't skip meals. Eat a balanced diet. 8 hours of sleep nightly. Minimize stress. Exercise 30 minutes per day. Being overweight is associated with a 5 times increased risk of chronic migraine. Keep well hydrated and drink 6-8 glasses of water per day.   4. Initiate non-pharmacologic measures at the earliest onset of your headache. Rest and quiet environment. Relax and reduce stress. Breathe2Relax is a free app that can instruct you on    some simple relaxtion and breathing techniques. Http://Dawnbuse.com is a    free website that provides teaching videos on relaxation.  Also, there are  many apps that   can be downloaded for "mindful" relaxation.  An app called YOGA NIDRA will help walk you through mindfulness. Another app called Calm can be downloaded to give you a structured mindfulness guide with daily reminders and skill development. Headspace for guided meditation Mindfulness Based Stress Reduction Online Course: www.palousemindfulness.com Cold compresses.   5. Don't wait!! Take the maximum allowable dosage of prescribed medication at the first sign of migraine.   6. Compliance:  Take prescribed medication regularly as directed and at the first sign of a migraine.   7. Communicate:  Call your physician when problems arise, especially if your headaches change, increase in frequency/severity, or become  associated with neurological symptoms (weakness, numbness, slurred speech, etc.). Proceed to emergency room if you experience new or worsening symptoms or symptoms do not resolve, if you have new neurologic symptoms or if headache is severe, or for any concerning symptom.   8. Headache/pain management therapies: Consider various complementary methods, including medication, behavioral therapy, psychological counselling, biofeedback, massage therapy, acupuncture, dry needling, and other modalities.  Such measures may reduce the need for medications. Counseling for pain management, where patients learn to function and ignore/minimize their pain, seems to work very well.   9. Recommend changing family's attention and focus away from patient's headaches. Instead, emphasize daily activities. If first question of day is 'How are your headaches/Do you have a headache today?', then patient will constantly think about headaches, thus making them worse. Goal is to re-direct attention away from headaches, toward daily activities and other distractions.   10. Helpful Websites: www.AmericanHeadacheSociety.org PatentHood.ch www.headaches.org TightMarket.nl www.achenet.org

## 2023-01-14 ENCOUNTER — Other Ambulatory Visit: Payer: Self-pay | Admitting: Student

## 2023-01-14 DIAGNOSIS — I10 Essential (primary) hypertension: Secondary | ICD-10-CM

## 2023-01-14 NOTE — Progress Notes (Unsigned)
  Cardiology Office Note:  .   Date:  01/14/2023  ID:  Sabas Sous, DOB 16-Sep-1985, MRN [id number] PCP: Lincoln Brigham, MD  [facility] Providers Cardiologist: Thomasene Ripple, DO {  }   History of Present Illness: .   HAIVYN BUCHWALD is a 37 y.o. female with history of postpartum hypertension, chronic hypertension, and asthma.  Last seen by Dr. Servando Salina on 01/12/2022 and had some complaints of lower extremity edema.  She had run out of Primatene HCTZ at that time.  When last seen she was increased on valsartan dose 260 mg daily, she was placed on Lasix 40 mg daily for 5 doses as well as potassium supplement.  She was to follow-up with daily blood pressures and report significant elevations.  She was advised on weight loss and increasing exercise. ROS: ***  Studies Reviewed: .   Echocardiogram 10/27/2021  1. Left ventricular ejection fraction, by estimation, is 55 to 60%. The  left ventricle has normal function. The left ventricle has no regional  wall motion abnormalities. Left ventricular diastolic parameters were  normal. The average left ventricular  global longitudinal strain is -23.1 %. The global longitudinal strain is  normal.   2. Right ventricular systolic function is normal. The right ventricular  size is normal.   3. The mitral valve is normal in structure. No evidence of mitral valve  regurgitation. No evidence of mitral stenosis.   4. The aortic valve is tricuspid. Aortic valve regurgitation is trivial.  No aortic stenosis is present.   5. The inferior vena cava is normal in size with greater than 50%  respiratory variability, suggesting right atrial pressure of 3 mmHg.   *** EKG Interpretation Date/Time:    Ventricular Rate:    PR Interval:    QRS Duration:    QT Interval:    QTC Calculation:   R Axis:      Text Interpretation:      Physical Exam:   VS:  There were no vitals taken for this visit.   Wt Readings from Last 3 Encounters:  12/27/22 191 lb 8 oz  (86.9 kg)  12/19/22 188 lb (85.3 kg)  12/15/22 190 lb 6.4 oz (86.4 kg)    GEN: Well nourished, well developed in no acute distress NECK: No JVD; No carotid bruits CARDIAC: ***RRR, no murmurs, rubs, gallops RESPIRATORY:  Clear to auscultation without rales, wheezing or rhonchi  ABDOMEN: Soft, non-tender, non-distended EXTREMITIES:  No edema; No deformity   ASSESSMENT AND PLAN: .   ***    {Are you ordering a CV Procedure (e.g. stress test, cath, DCCV, TEE, etc)?   Press F2        :[id number]}    Signed, Bettey Mare. Liborio Nixon, ANP, AACC

## 2023-01-16 ENCOUNTER — Ambulatory Visit: Payer: Medicaid Other | Attending: Adult Health | Admitting: Adult Health

## 2023-01-17 ENCOUNTER — Ambulatory Visit: Payer: Medicaid Other | Admitting: Family Medicine

## 2023-01-17 NOTE — Progress Notes (Unsigned)
    SUBJECTIVE:   CHIEF COMPLAINT / HPI:   ***  F/u BP, previously increased metop  PERTINENT  PMH / PSH: ***  OBJECTIVE:   There were no vitals taken for this visit.  ***  ASSESSMENT/PLAN:   No problem-specific Assessment & Plan notes found for this encounter.     Lincoln Brigham, MD [facility]

## 2023-01-19 ENCOUNTER — Encounter: Payer: Medicaid Other | Admitting: Physician Assistant

## 2023-03-09 ENCOUNTER — Encounter: Payer: Medicaid Other | Admitting: Physician Assistant

## 2023-03-28 ENCOUNTER — Encounter: Payer: Self-pay | Admitting: Family Medicine

## 2023-03-28 ENCOUNTER — Other Ambulatory Visit: Payer: Self-pay | Admitting: Family Medicine

## 2023-03-28 DIAGNOSIS — I10 Essential (primary) hypertension: Secondary | ICD-10-CM

## 2023-03-29 ENCOUNTER — Other Ambulatory Visit: Payer: Self-pay

## 2023-03-29 DIAGNOSIS — I10 Essential (primary) hypertension: Secondary | ICD-10-CM

## 2023-03-30 MED ORDER — METOPROLOL SUCCINATE ER 50 MG PO TB24: 50.0000 mg | ORAL_TABLET | Freq: Two times a day (BID) | ORAL | 3 refills | Status: AC

## 2023-04-27 ENCOUNTER — Encounter: Payer: Medicaid Other | Admitting: Physician Assistant

## 2023-05-14 ENCOUNTER — Ambulatory Visit: Admitting: Student

## 2023-06-08 ENCOUNTER — Encounter: Admitting: Physician Assistant

## 2023-07-25 NOTE — Patient Instructions (Incomplete)

## 2023-07-25 NOTE — Progress Notes (Deleted)
 No chief complaint on file.   HISTORY OF PRESENT ILLNESS:  07/25/23 ALL:  Michele Gardner returns for follow up for migraines. She was last seen by me 12/2022 and reported worsening headaches. GYN had started her on topiramate  and Emgality  just prior to visit, however, she had only taken started dose of Emgality  and not started topiramate . Since,   BP? Sleep? Hemiplegic symptoms? Birth control?  12/27/2022 ALL:  Michele Gardner is a 38 y.o. female here today for follow up for migraines. She was seen in consult with Dr Billy Bue 03/2021. She was pregnant at the time and started on cyproheptadine  at bedtime. MRI brian and MRV head unremarkable. Referral to PT placed and occipital nerve block administered 2/16. Follow up advised in 3 months.   Since, she reports worsening headaches. She describes a left sided occipital pain. Sometimes sharp stabbing, sometimes throbbing. She occasionally has unilateral numbness and feels she can't see as well. Usually worse at night and in the mornings. BP has been elevated. She was seen by PCP 12/19/2022 for HTN. Metoprolol  was increased to 50mg  BID for added BP management and migraine control. She has not taken her HTN meds today. She does wake with dry mouth. She is not sure if she snores. She denies family history of sleep apnea. She tries to stay hydrated. She has not had an eye exam.   She was recently seen by the ER 10/22, 10/28 and 10/30 for intractable left sided occipital headache for 9 days. CT head, MRI brain and MRV normal. LP normal. Divalproex not effective. She was seen by OB-GYN 12/15/2022 who started topiramate . She reported feeling groggy on topiramate  75mg  and was advised to reduce dose to 25-50mg  daily. Emgality  added. She took started dose but has not filled maintenance dose yet. She has not taken topiramate .   HISTORY (copied from Dr Margrette Shield previous note)  The patient presents for evaluation of daily migraines which have been present since she was 38  years old, but have worsened in severity since becoming pregnant. Migraines occur daily and last for several hours at a time. They are described as pounding pain in her vertex with associated photophobia, phonophobia, and nausea.   Takes Tylenol  as needed but this doesn't help much. Flexeril  helps but makes her drowsy.   Headache History: Onset: 15 years ago Triggers: none Aura: no Location: vertex Quality/Description: pounding Associated Symptoms:             Photophobia: yes             Phonophobia: yes             Nausea: yes Vomiting: yes Worse with activity?: yes Duration of headaches: several hours   Headache days per month: 30 Headache free days per month: 0   Current Treatment: Abortive Flexeril  Tylenol    Preventative none   Prior Therapies                                 Labetalol  400 mg BID Flexeril  Tylenol  Excedrin   Headache Risk Factors: Headache risk factors and/or co-morbidities (+) Neck Pain   REVIEW OF SYSTEMS: Out of a complete 14 system review of symptoms, the patient complains only of the following symptoms, headaches, and all other reviewed systems are negative.   ALLERGIES: Allergies  Allergen Reactions   Bee Venom Anaphylaxis     HOME MEDICATIONS: Outpatient Medications Prior to Visit  Medication Sig Dispense  Refill   Blood Pressure Monitoring (BLOOD PRESSURE KIT) DEVI 1 kit by Does not apply route once a week. 1 each 0   Butalbital -APAP-Caffeine  50-325-40 MG capsule Take 1-2 capsules by mouth every 6 (six) hours as needed for headache. 40 capsule 2   cyclobenzaprine  (FLEXERIL ) 10 MG tablet Take 1 tablet (10 mg total) by mouth 3 (three) times daily as needed for muscle spasms (headache). 30 tablet 2   Galcanezumab -gnlm (EMGALITY ) 120 MG/ML SOAJ Inject 240 mg into the skin as directed AND 120 mg every 30 (thirty) days. Inj 240mg  once then 120mg  monthly. (Patient not taking: Reported on 12/27/2022) 1 mL 11   metoprolol  succinate (TOPROL   XL) 50 MG 24 hr tablet Take 1 tablet (50 mg total) by mouth in the morning and at bedtime. Take with or immediately following a meal. 180 tablet 3   metoprolol  succinate (TOPROL -XL) 50 MG 24 hr tablet TAKE 1 TABLET BY MOUTH IN THE MORNING AND TAKE 1 TABLET BY MOUTH AT BEDTIME. TAKE  WITH  OR  IMMEDIATELY  FOLLOWING  A  MEAL 60 tablet 0   Prenatal Vit-Fe Fumarate-FA (PRENATAL MULTIVITAMIN) TABS tablet Take 1 tablet by mouth daily. (Patient not taking: Reported on 12/27/2022)     Rimegepant Sulfate  (NURTEC) 75 MG TBDP Take 1 tablet (75 mg total) by mouth as needed (migraine). (Patient not taking: Reported on 12/27/2022) 8 tablet 11   topiramate  (TOPAMAX ) 25 MG tablet Take 3 tablets (75 mg total) by mouth daily. (Patient not taking: Reported on 12/27/2022) 90 tablet 3   valsartan -hydrochlorothiazide  (DIOVAN -HCT) 320-25 MG tablet Take 1 tablet by mouth once daily 90 tablet 2   No facility-administered medications prior to visit.     PAST MEDICAL HISTORY: Past Medical History:  Diagnosis Date   Asthma    Chronic hypertension affecting pregnancy 12/29/2020   Blood pressure is resistant to medications.  On enalapril  20 mg daily and Maxide 37.5-25 mg daily        Hypertension    Migraine      PAST SURGICAL HISTORY: Past Surgical History:  Procedure Laterality Date   WISDOM TOOTH EXTRACTION  2007     FAMILY HISTORY: Family History  Problem Relation Age of Onset   Hypertension Mother    Hyperlipidemia Mother    Varicose Veins Father    Melanoma Father    Alcohol abuse Father    Cancer Maternal Grandfather    Diabetes Paternal Grandmother    Cancer Paternal Grandmother    Diabetes Paternal Grandfather    Brain cancer Other      SOCIAL HISTORY: Social History   Socioeconomic History   Marital status: Single    Spouse name: Not on file   Number of children: Not on file   Years of education: Not on file   Highest education level: Not on file  Occupational History   Not on  file  Tobacco Use   Smoking status: Former    Current packs/day: 0.00    Types: Cigarettes    Quit date: 04/23/2015    Years since quitting: 8.2    Passive exposure: Past   Smokeless tobacco: Never  Vaping Use   Vaping status: Never Used  Substance and Sexual Activity   Alcohol use: No   Drug use: No   Sexual activity: Yes    Partners: Male    Birth control/protection: None  Other Topics Concern   Not on file  Social History Narrative   Not on file   Social Drivers  of Health   Financial Resource Strain: Not on file  Food Insecurity: No Food Insecurity (05/25/2022)   Hunger Vital Sign    Worried About Running Out of Food in the Last Year: Never true    Ran Out of Food in the Last Year: Never true  Transportation Needs: No Transportation Needs (05/25/2022)   PRAPARE - Administrator, Civil Service (Medical): No    Lack of Transportation (Non-Medical): No  Physical Activity: Not on file  Stress: Not on file  Social Connections: Not on file  Intimate Partner Violence: Not At Risk (05/25/2022)   Humiliation, Afraid, Rape, and Kick questionnaire    Fear of Current or Ex-Partner: No    Emotionally Abused: No    Physically Abused: No    Sexually Abused: No     PHYSICAL EXAM  There were no vitals filed for this visit.  There is no height or weight on file to calculate BMI.  Generalized: Well developed, in no acute distress  Cardiology: normal rate and rhythm, no murmur auscultated  Respiratory: clear to auscultation bilaterally    Neurological examination  Mentation: Alert oriented to time, place, history taking. Follows all commands speech and language fluent Cranial nerve II-XII: Pupils were equal round reactive to light. Extraocular movements were full, visual field were full on confrontational test. Facial sensation and strength were normal. Uvula tongue midline. Head turning and shoulder shrug  were normal and symmetric. Motor: The motor testing reveals 5  over 5 strength of all 4 extremities. Good symmetric motor tone is noted throughout.  Sensory: Sensory testing is intact to soft touch on all 4 extremities. No evidence of extinction is noted.  Coordination: Cerebellar testing reveals good finger-nose-finger and heel-to-shin bilaterally.  Gait and station: Gait is normal. Tandem gait is normal. Romberg is negative. No drift is seen.  Reflexes: Deep tendon reflexes are symmetric and normal bilaterally.    DIAGNOSTIC DATA (LABS, IMAGING, TESTING) - I reviewed patient records, labs, notes, testing and imaging myself where available.  Lab Results  Component Value Date   WBC 9.7 12/13/2022   HGB 14.3 12/13/2022   HCT 42.2 12/13/2022   MCV 85.8 12/13/2022   PLT 300 12/13/2022      Component Value Date/Time   NA 139 12/19/2022 1031   K 4.4 12/19/2022 1031   CL 99 12/19/2022 1031   CO2 21 12/19/2022 1031   GLUCOSE 94 12/19/2022 1031   GLUCOSE 96 12/13/2022 0922   BUN 10 12/19/2022 1031   CREATININE 0.89 12/19/2022 1031   CREATININE 0.48 (L) 05/17/2015 0939   CALCIUM 9.8 12/19/2022 1031   PROT 7.0 12/13/2022 0922   PROT 7.2 11/28/2022 1225   ALBUMIN 3.8 12/13/2022 0922   ALBUMIN 4.4 11/28/2022 1225   AST 20 12/13/2022 0922   ALT 21 12/13/2022 0922   ALKPHOS 81 12/13/2022 0922   BILITOT 0.5 12/13/2022 0922   BILITOT 0.4 11/28/2022 1225   GFRNONAA >60 12/13/2022 0922   GFRAA >60 11/30/2015 1015   No results found for: CHOL, HDL, LDLCALC, LDLDIRECT, TRIG, CHOLHDL Lab Results  Component Value Date   HGBA1C 5.4 02/17/2021   No results found for: [id number] Lab Results  Component Value Date   TSH 0.373 (L) 11/28/2022        No data to display               No data to display           ASSESSMENT AND PLAN  38 y.o. year old female  has a past medical history of Asthma, Chronic hypertension affecting pregnancy (12/29/2020), Hypertension, and Migraine. here with    No diagnosis found.  Michele Gardner reports worsening headaches with hemiplegic symptoms over the past few months. OB-GYN has started her on topiramate  25-50mg  daily and Emgality  monthly. I have encouraged her to continue with this plan. May consider sleep evaluation for possible sleep apnea testing due to morning headaches and non restorative sleep. Healthy lifestyle habits encouraged. She will follow up with PCP as directed. She will keep a close eye on BP. She was encouraged to update eye exam. She will return to see me in 6-8 months, sooner if needed. She verbalizes understanding and agreement with this plan.    No orders of the defined types were placed in this encounter.    No orders of the defined types were placed in this encounter.   I spent 30 minutes of face-to-face and non-face-to-face time with patient.  This included previsit chart review, lab review, study review, order entry, electronic health record documentation, patient education.   Terrilyn Fick, MSN, FNP-C 07/25/2023, 9:39 AM  [facility] [address] [place], [place] [address] [phone]

## 2023-07-26 ENCOUNTER — Ambulatory Visit: Payer: Medicaid Other | Admitting: Family Medicine

## 2023-11-20 ENCOUNTER — Ambulatory Visit: Admitting: Family Medicine

## 2023-11-20 ENCOUNTER — Encounter: Payer: Self-pay | Admitting: Family Medicine

## 2023-11-20 VITALS — BP 150/119 | HR 85 | Ht 66.0 in | Wt 187.2 lb

## 2023-11-20 DIAGNOSIS — J4521 Mild intermittent asthma with (acute) exacerbation: Secondary | ICD-10-CM

## 2023-11-20 DIAGNOSIS — I10 Essential (primary) hypertension: Secondary | ICD-10-CM

## 2023-11-20 MED ORDER — BUDESONIDE-FORMOTEROL FUMARATE 160-4.5 MCG/ACT IN AERO: 2.0000 | INHALATION_SPRAY | Freq: Two times a day (BID) | RESPIRATORY_TRACT | 3 refills | Status: AC

## 2023-11-20 NOTE — Progress Notes (Unsigned)
    SUBJECTIVE:   CHIEF COMPLAINT / HPI:   Michele Gardner is a 38 yo F that pf cough - Starting 2 days ago, started to cough a lot - Denies fevers, sore throat - Has been Dayquil, mucinex   - No known sick contact, no travel - Has prior hx of smoking, quit 9 years ago.  - Does not currently have an inhlaer for asthma, she used to.    HTN - Pt reports not taking her BP meds today  PERTINENT  PMH / PSH: asthma  OBJECTIVE:   BP (!) 143/110   Pulse 85   Ht 5' 6 (1.676 m)   Wt 187 lb 3.2 oz (84.9 kg)   SpO2 96%   BMI 30.21 kg/m   General: Alert, pleasant well appearing woman, occasional dry cough while talking. NAD. HEENT: NCAT. MMM. CV: RRR, no murmurs. Cap refill <2. Resp: CTAB, no wheezing or crackles. Normal WOB on RA.  Abm: Soft, nontender, nondistended. BS present. Ext: Moves all ext spontaneously Skin: Warm, well perfused   ASSESSMENT/PLAN:   Assessment & Plan Mild intermittent asthma with acute exacerbation Suspect viral URI, with potential mild component of asthma. Reassuringly afebrile, VSS, and normal WOB. -  Counseled on supportive care and return precautions.  - Start symbicort 4 puff BID x5 days, then 2puff prn for exacerbations Primary hypertension Uncontrolled, likely from not taking meds and viral URI. Counseled on benefits of BP control and advised to f/u in 2 wks to recheck BP on medications.      Michele Nearing, MD [facility]

## 2023-11-20 NOTE — Patient Instructions (Addendum)
 1) You most likely have a viral upper respiratory infection (aka a cold). Your body will naturally fight off this infection over the next 7-14 days. You may have a lingering cough for 6-8 weeks after the infection is gone, this is normal and helps to clear debris out of your lungs.   -Drink plenty of water and fluids to stay hydrated.  Your urine should be clear or light yellow. -Take Tylenol  or ibuprofen  as needed for fevers of the 100.3 Fahrenheit or higher -You can take 1 tablespoon of bees honey 4 times a day.  This can help soothe your throat.  Please seek further medical attention if you: - have trouble breathing - are vomiting uncontrollably - are unable to drink enough to stay hydrated - have fevers of 100.2F or higher for 3 days in a row    2) Your blood pressure was quite high today. We want to get it under control now to prevent heart disease, strokes, and other cardiovascular issues when you get older. I know we've been working on controlling your blood pressure in the past.  - Come back in 2 weeks, make sure you are taking your medications. We can recheck your blood pressure and adjust your blood pressure regimen as needed.

## 2023-11-21 ENCOUNTER — Telehealth: Payer: Self-pay

## 2023-11-21 NOTE — Telephone Encounter (Signed)
 Patient Michele Gardner on nurse line regarding follow up from visit yesterday.   Returned call to patient. Yesterday- tonsils and lymph nodes were swollen. Tonsils seem more swollen today with sore throat. Denies fever. + post nasal drip.   Patient has not yet picked up Symbicort inhaler. Instructed patient to pick this up from pharmacy, as this may help with lung irritation. Patient is speaking in complete sentences, denies difficulty breathing, SHOB at this time.   Provided with supportive measures and ED precautions.   Patient is asking if she could receive antibiotics for enlarged tonsils.   Will forward to PCP for further advisement.   Chiquita JAYSON English, RN

## 2023-11-21 NOTE — Telephone Encounter (Signed)
 Called patient and informed of message per Dr. Elicia.   Reiterated supportive measures and discussed return precautions.   Chiquita JAYSON English, RN

## 2023-11-22 NOTE — Assessment & Plan Note (Signed)
 Suspect viral URI, with potential mild component of asthma. Reassuringly afebrile, VSS, and normal WOB. -  Counseled on supportive care and return precautions.  - Start symbicort 4 puff BID x5 days, then 2puff prn for exacerbations

## 2023-12-17 ENCOUNTER — Ambulatory Visit: Admitting: Family Medicine

## 2024-02-27 ENCOUNTER — Ambulatory Visit: Admitting: Family Medicine

## 2024-03-21 ENCOUNTER — Emergency Department (HOSPITAL_COMMUNITY)
Admission: EM | Admit: 2024-03-21 | Discharge: 2024-03-21 | Disposition: A | Discharge status: Home / Self Care | Source: Home / Self Care | Attending: Emergency Medicine | Admitting: Emergency Medicine

## 2024-03-21 DIAGNOSIS — M5441 Lumbago with sciatica, right side: Secondary | ICD-10-CM

## 2024-03-21 LAB — HCG, SERUM, QUALITATIVE: Preg, Serum: NEGATIVE

## 2024-03-21 LAB — CBC WITH DIFFERENTIAL/PLATELET
Abs Immature Granulocytes: 0.03 10*3/uL (ref 0.00–0.07)
Basophils Absolute: 0.1 10*3/uL (ref 0.0–0.1)
Basophils Relative: 1 %
Eosinophils Absolute: 0.2 10*3/uL (ref 0.0–0.5)
Eosinophils Relative: 3 %
HCT: 41.9 % (ref 36.0–46.0)
Hemoglobin: 14.2 g/dL (ref 12.0–15.0)
Immature Granulocytes: 0 %
Lymphocytes Relative: 16 %
Lymphs Abs: 1.1 10*3/uL (ref 0.7–4.0)
MCH: 30 pg (ref 26.0–34.0)
MCHC: 33.9 g/dL (ref 30.0–36.0)
MCV: 88.4 fL (ref 80.0–100.0)
Monocytes Absolute: 0.4 10*3/uL (ref 0.1–1.0)
Monocytes Relative: 6 %
Neutro Abs: 5.3 10*3/uL (ref 1.7–7.7)
Neutrophils Relative %: 74 %
Platelets: 348 10*3/uL (ref 150–400)
RBC: 4.74 MIL/uL (ref 3.87–5.11)
RDW: 12.4 % (ref 11.5–15.5)
WBC: 7.2 10*3/uL (ref 4.0–10.5)
nRBC: 0 % (ref 0.0–0.2)

## 2024-03-21 LAB — URINALYSIS, ROUTINE W REFLEX MICROSCOPIC
Bacteria, UA: NONE SEEN
Bilirubin Urine: NEGATIVE
Glucose, UA: NEGATIVE mg/dL
Hgb urine dipstick: NEGATIVE
Ketones, ur: NEGATIVE mg/dL
Nitrite: NEGATIVE
Protein, ur: NEGATIVE mg/dL
Specific Gravity, Urine: 1.031 — ABNORMAL HIGH (ref 1.005–1.030)
pH: 6 (ref 5.0–8.0)

## 2024-03-21 LAB — BASIC METABOLIC PANEL WITH GFR
Anion gap: 10 (ref 5–15)
BUN: 11 mg/dL (ref 6–20)
CO2: 26 mmol/L (ref 22–32)
Calcium: 8.9 mg/dL (ref 8.9–10.3)
Chloride: 104 mmol/L (ref 98–111)
Creatinine, Ser: 0.82 mg/dL (ref 0.44–1.00)
GFR, Estimated: >60 mL/min
Glucose, Bld: 98 mg/dL (ref 70–99)
Potassium: 3.4 mmol/L — ABNORMAL LOW (ref 3.5–5.1)
Sodium: 140 mmol/L (ref 135–145)

## 2024-03-21 MED ORDER — KETOROLAC TROMETHAMINE 30 MG/ML IJ SOLN
15.0000 mg | Freq: Once | INTRAMUSCULAR | Status: AC
  Administered 2024-03-21: 15 mg via INTRAMUSCULAR
  Filled 2024-03-21: qty 1

## 2024-03-21 MED ORDER — SALONPAS 3.1-6-10 % EX PTCH: 1.0000 | MEDICATED_PATCH | Freq: Two times a day (BID) | CUTANEOUS | 0 refills | Status: AC

## 2024-03-21 MED ORDER — PREDNISONE 20 MG PO TABS
60.0000 mg | ORAL_TABLET | ORAL | Status: AC
  Administered 2024-03-21: 60 mg via ORAL
  Filled 2024-03-21: qty 3

## 2024-03-21 MED ORDER — PREDNISONE 20 MG PO TABS: 40.0000 mg | ORAL_TABLET | Freq: Every day | ORAL | 0 refills | Status: AC

## 2024-03-21 MED ORDER — DICLOFENAC EPOLAMINE 1.3 % EX PTCH
1.0000 | MEDICATED_PATCH | Freq: Two times a day (BID) | CUTANEOUS | Status: DC
  Filled 2024-03-21: qty 1

## 2024-03-21 NOTE — Discharge Instructions (Addendum)
 Your patient improve over the next 3 days or so using the prescribed medication.  Return here for concerning changes otherwise follow-up with your physician.  You are blood test and urine test today have been generally unremarkable.  There was a trace amount of blood in your urine which can be normal, however should be discussed with your physician.

## 2024-03-21 NOTE — ED Triage Notes (Signed)
 X 2 days pt with right lower back that radiates to right leg, pt denies injury or trauma.

## 2024-03-21 NOTE — ED Provider Notes (Signed)
 " [place] EMERGENCY DEPARTMENT AT [facility] Provider Note   CSN: [id number] Arrival date & time: 03/21/24  9082     Patient presents with: Back Pain   Michele Gardner is a 39 y.o. female.   HPI Adult female presents with pain in the right lower back, pain radiates both contralaterally in the lower back as well as inferior along the right leg.  No history of trauma, surgery, fall.  Onset about 2 days ago, since that time pain has been persistent, not improved with OTC medication.    Prior to Admission medications  Medication Sig Start Date End Date Taking? Authorizing Provider  Camphor-Menthol -Methyl Sal (SALONPAS ) 3.02-19-08 % PTCH Apply 1 patch topically in the morning and at bedtime. 03/21/24  Yes Garrick Charleston, MD  predniSONE  (DELTASONE ) 20 MG tablet Take 2 tablets (40 mg total) by mouth daily with breakfast. For the next four days 03/21/24  Yes Garrick Charleston, MD  Blood Pressure Monitoring (BLOOD PRESSURE KIT) DEVI 1 kit by Does not apply route once a week. 01/24/21   Constant, Peggy, MD  budesonide -formoterol  (SYMBICORT ) 160-4.5 MCG/ACT inhaler Inhale 2 puffs into the lungs 2 (two) times daily. 11/20/23   Elicia Hamlet, MD  Butalbital -APAP-Caffeine  50-325-40 MG capsule Take 1-2 capsules by mouth every 6 (six) hours as needed for headache. 04/29/21   Nance Gaskins, Darice BRAVO, PA-C  cyclobenzaprine  (FLEXERIL ) 10 MG tablet Take 1 tablet (10 mg total) by mouth 3 (three) times daily as needed for muscle spasms (headache). 11/17/22   Nance Gaskins, Darice BRAVO, PA-C  Galcanezumab -gnlm (EMGALITY ) 120 MG/ML SOAJ Inject 240 mg into the skin as directed AND 120 mg every 30 (thirty) days. Inj 240mg  once then 120mg  monthly. Patient not taking: Reported on 12/27/2022 12/01/22   Nance Gaskins, Darice BRAVO, PA-C  metoprolol  succinate (TOPROL  XL) 50 MG 24 hr tablet Take 1 tablet (50 mg total) by mouth in the morning and at bedtime. Take with or immediately following a meal. 03/30/23   Elicia Hamlet, MD   metoprolol  succinate (TOPROL -XL) 50 MG 24 hr tablet TAKE 1 TABLET BY MOUTH IN THE MORNING AND TAKE 1 TABLET BY MOUTH AT BEDTIME. TAKE  WITH  OR  IMMEDIATELY  FOLLOWING  A  MEAL 03/30/23   Elicia Hamlet, MD  Prenatal Vit-Fe Fumarate-FA (PRENATAL MULTIVITAMIN) TABS tablet Take 1 tablet by mouth daily. Patient not taking: Reported on 12/27/2022    [provider]  Rimegepant Sulfate  (NURTEC) 75 MG TBDP Take 1 tablet (75 mg total) by mouth as needed (migraine). Patient not taking: Reported on 12/27/2022 12/01/22   Nance Gaskins, Darice BRAVO, PA-C  topiramate  (TOPAMAX ) 25 MG tablet Take 3 tablets (75 mg total) by mouth daily. Patient not taking: Reported on 12/27/2022 12/15/22   Nance Gaskins, Darice BRAVO, PA-C  valsartan -hydrochlorothiazide  (DIOVAN -HCT) 320-25 MG tablet Take 1 tablet by mouth once daily 01/15/23   Elicia Hamlet, MD    Allergies: Bee venom    Review of Systems  Updated Vital Signs BP (!) 167/117   Pulse 68   Temp 98.2 F (36.8 C) (Oral)   Resp 16   SpO2 100%   Physical Exam Vitals and nursing note reviewed.  Constitutional:      General: She is not in acute distress.    Appearance: She is well-developed.  HENT:     Head: Normocephalic and atraumatic.  Eyes:     Conjunctiva/sclera: Conjunctivae normal.  Cardiovascular:     Rate and Rhythm: Normal rate and regular rhythm.  Pulmonary:  Effort: Pulmonary effort is normal. No respiratory distress.     Breath sounds: Normal breath sounds. No stridor.  Abdominal:     General: There is no distension.  Musculoskeletal:       Arms:     Comments: Patient flexes each hip against resistance with pain referred to the right lower back with both motions.  Skin:    General: Skin is warm and dry.  Neurological:     Mental Status: She is alert and oriented to person, place, and time.     Cranial Nerves: No cranial nerve deficit.  Psychiatric:        Mood and Affect: Mood normal.     (all labs ordered are listed, but only  abnormal results are displayed) Labs Reviewed  URINALYSIS, ROUTINE W REFLEX MICROSCOPIC - Abnormal; Notable for the following components:      Result Value   APPearance HAZY (*)    Specific Gravity, Urine 1.031 (*)    Leukocytes,Ua SMALL (*)    All other components within normal limits  BASIC METABOLIC PANEL WITH GFR - Abnormal; Notable for the following components:   Potassium 3.4 (*)    All other components within normal limits  HCG, SERUM, QUALITATIVE  CBC WITH DIFFERENTIAL/PLATELET    EKG: None  Radiology: No results found.   Procedures   Medications Ordered in the ED  diclofenac  (FLECTOR ) 1.3 % 1 patch (has no administration in time range)  predniSONE  (DELTASONE ) tablet 60 mg (has no administration in time range)  ketorolac  (TORADOL ) 30 MG/ML injection 15 mg (15 mg Intramuscular Given 03/21/24 1103)                                    Medical Decision Making Female presents with right low back pain, mixed features including contralateral pain, inferiorly rating pain suggest musculoskeletal strain versus sciatica versus urinary tract infection.  Absence of incontinence, true loss of sensation, gait instability reassuring for low suspicion of CNS disruption.  Labs urinalysis ordered, analgesia ordered  Amount and/or Complexity of Data Reviewed External Data Reviewed: notes.    Details: History of migraines, no noted history of sciatica Labs: ordered. Decision-making details documented in ED Course.  Risk OTC drugs. Prescription drug management.   12:59 PM Patient in no distress, labs reviewed, discussed, generally unremarkable patient does have small leukocytes in her urine, has no flank pain, no urinary complaints, and with sciatica-like features, suspicion higher for lumbosacral radiculopathy than occult kidney stone this is a possibility. Patient has received analgesics here, will continue same on discharge, with no evidence for acute CNS abnormality, hemodynamic  instability, patient will follow-up with primary care.     Final diagnoses:  Acute right-sided low back pain with right-sided sciatica    ED Discharge Orders          Ordered    predniSONE  (DELTASONE ) 20 MG tablet  Daily with breakfast        03/21/24 1259    Camphor-Menthol -Methyl Sal (SALONPAS ) 3.02-19-08 % PTCH  2 times daily        03/21/24 1259               Garrick Charleston, MD 03/21/24 1301  "

## 2024-03-21 NOTE — ED Notes (Signed)
 Patient attempted to provide urine specimen but is unable at this time

## 2024-04-15 ENCOUNTER — Ambulatory Visit: Admitting: Family Medicine
